# Patient Record
Sex: Male | Born: 1945 | Race: White | Hispanic: No | Marital: Married | Smoking: Former smoker
Health system: Southern US, Community
[De-identification: ages and names within clinical notes are randomized; demographics above are authoritative.]

## PROBLEM LIST (undated history)

## (undated) DIAGNOSIS — J449 Chronic obstructive pulmonary disease, unspecified: Secondary | ICD-10-CM

## (undated) DIAGNOSIS — K219 Gastro-esophageal reflux disease without esophagitis: Secondary | ICD-10-CM

## (undated) DIAGNOSIS — E785 Hyperlipidemia, unspecified: Secondary | ICD-10-CM

## (undated) DIAGNOSIS — IMO0001 Reserved for inherently not codable concepts without codable children: Secondary | ICD-10-CM

## (undated) DIAGNOSIS — I219 Acute myocardial infarction, unspecified: Secondary | ICD-10-CM

## (undated) DIAGNOSIS — C801 Malignant (primary) neoplasm, unspecified: Secondary | ICD-10-CM

## (undated) DIAGNOSIS — I10 Essential (primary) hypertension: Secondary | ICD-10-CM

## (undated) DIAGNOSIS — I6340 Cerebral infarction due to embolism of unspecified cerebral artery: Secondary | ICD-10-CM

## (undated) DIAGNOSIS — R062 Wheezing: Secondary | ICD-10-CM

## (undated) DIAGNOSIS — I509 Heart failure, unspecified: Secondary | ICD-10-CM

## (undated) DIAGNOSIS — R053 Chronic cough: Secondary | ICD-10-CM

## (undated) DIAGNOSIS — I739 Peripheral vascular disease, unspecified: Secondary | ICD-10-CM

## (undated) DIAGNOSIS — R7303 Prediabetes: Secondary | ICD-10-CM

## (undated) DIAGNOSIS — R05 Cough: Secondary | ICD-10-CM

## (undated) DIAGNOSIS — M1990 Unspecified osteoarthritis, unspecified site: Secondary | ICD-10-CM

## (undated) DIAGNOSIS — I2510 Atherosclerotic heart disease of native coronary artery without angina pectoris: Secondary | ICD-10-CM

## (undated) HISTORY — DX: Reserved for inherently not codable concepts without codable children: IMO0001

## (undated) HISTORY — DX: Cough: R05

## (undated) HISTORY — DX: Gastro-esophageal reflux disease without esophagitis: K21.9

## (undated) HISTORY — PX: CERVICAL DISC SURGERY: SHX588

## (undated) HISTORY — DX: Essential (primary) hypertension: I10

## (undated) HISTORY — DX: Peripheral vascular disease, unspecified: I73.9

## (undated) HISTORY — DX: Chronic cough: R05.3

## (undated) HISTORY — DX: Chronic obstructive pulmonary disease, unspecified: J44.9

## (undated) HISTORY — DX: Wheezing: R06.2

## (undated) HISTORY — DX: Hyperlipidemia, unspecified: E78.5

## (undated) HISTORY — DX: Atherosclerotic heart disease of native coronary artery without angina pectoris: I25.10

## (undated) HISTORY — PX: APPENDECTOMY: SHX54

---

## 2001-11-15 HISTORY — PX: CORONARY ANGIOPLASTY WITH STENT PLACEMENT: SHX49

## 2002-02-23 ENCOUNTER — Inpatient Hospital Stay (HOSPITAL_COMMUNITY): Admission: EM | Admit: 2002-02-23 | Discharge: 2002-02-27 | Payer: Self-pay | Admitting: Emergency Medicine

## 2002-02-23 ENCOUNTER — Encounter: Payer: Self-pay | Admitting: Emergency Medicine

## 2006-11-28 ENCOUNTER — Ambulatory Visit (HOSPITAL_COMMUNITY): Admission: RE | Admit: 2006-11-28 | Discharge: 2006-11-28 | Payer: Self-pay | Admitting: Vascular Surgery

## 2006-12-28 ENCOUNTER — Ambulatory Visit: Payer: Self-pay | Admitting: Vascular Surgery

## 2007-02-10 ENCOUNTER — Ambulatory Visit: Payer: Self-pay | Admitting: Vascular Surgery

## 2007-07-04 ENCOUNTER — Ambulatory Visit: Payer: Self-pay | Admitting: Vascular Surgery

## 2008-07-16 ENCOUNTER — Ambulatory Visit: Payer: Self-pay | Admitting: Vascular Surgery

## 2011-03-30 NOTE — Assessment & Plan Note (Signed)
OFFICE VISIT   CARLIN, ATTRIDGE  DOB:  09/27/1946                                       07/16/2008  ZOXWR#:[id number]   I saw the patient in the office today for continued followup of his  peripheral vascular disease.  Since I last saw him in February of 2008  he continues to have left lower extremity claudication.  He says over  the last year his symptoms have remained stable.  He can walk for at  least 50 yards before experiencing symptoms unless he is on an incline.  His claudication symptoms on the left involve his hip, thigh and calf.  He really has no significant claudication on the right side.  He denies  any history of rest pain and has had no history of nonhealing wounds.   SOCIAL HISTORY:  He continues to smoke a pack per day of cigarettes.   REVIEW OF SYSTEMS:  He has had no recent chest pain, chest pressure,  palpitations or arrhythmias.  He has had no bronchitis, asthma or  wheezing.   PHYSICAL EXAMINATION:  General:  On physical examination this is a  pleasant 65 year old gentleman who appears his stated age.  Vital signs:  Blood pressure is 132/79, heart rate is 74.  I do not detect any carotid  bruits.  Lungs:  Are clear bilaterally to auscultation.  Cardiac:  He  has a regular rate and rhythm.  He has a palpable right femoral pulse.  I cannot palpate a left femoral pulse.  He has monophasic Doppler  signals in both feet with an ankle brachial index of 63% on the right  and 55% on the left.   His ABIs remain stable over the last year.  I have again encouraged him  that he quit smoking.  I have also encouraged him to walk as much as  possible.  I plan on seeing him back in 18 months.  He knows to call  sooner if he has problems.   Di Kindle. Edilia Bo, M.D.  Electronically Signed   CSD/MEDQ  D:  07/16/2008  T:  07/17/2008  Job:  [id number]

## 2011-04-02 NOTE — Cardiovascular Report (Signed)
[place]. [facility]  Patient:    Joshua Horton, Joshua Horton Visit Number: [id number] MRN: [id number]          Service Type: MED Location: [phone] Attending Physician:  Talitha Givens Dictated by:   Arturo Morton Riley Kill, M.D. [facility] Proc. Date: 02/26/02 Admit Date:  02/23/2002                          Cardiac Catheterization  ADDENDUM:  The patient has a history of CONTRAST allergy and was pretreated prior to catheterization. Dictated by:   Arturo Morton Riley Kill, M.D. [facility] Attending Physician:  Talitha Givens DD:  02/26/02 TD:  02/26/02 Job: [id number]

## 2011-04-02 NOTE — H&P (Signed)
[place]. [facility]  Patient:    Joshua Horton, Joshua Horton Visit Number: [id number] MRN: [id number]          Service Type: MED Location: [phone] Attending Physician:  Talitha Givens Dictated by:   Luis Abed, M.D. [facility] Admit Date:  02/23/2002 Discharge Date: 02/27/2002                           History and Physical  CHIEF COMPLAINT: Mr. Brister is a 65 year old gentleman.  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  He has had chest discomfort and he came directly to [facility] for further evaluation.  HISTORY OF PRESENT ILLNESS: He does not have a main primary physician at home. He mentions that he has had chest pain with exertion for about one month. Today it was more prolonged and he had some nausea and diaphoresis with it. He had his wife bring him to [employer]. [facility].  He is now here for further evaluation.  ALLERGIES: There is an allergy to CT CONTRAST.  MEDICATIONS: None.  PAST MEDICAL HISTORY:  1. There is question of elevated lipids in the past with claudication,     followed by Dr. Cari Caraway.  2. Question of a murmur in the past.  3. He also had a neck procedure in 1998.  SOCIAL HISTORY: The patient lives in [place], [place] and is a retired Human resources officer, and works on his farm.  He does smoke.  He does not take any drugs.  FAMILY HISTORY: The mother died of cancer, with no coronary disease, and his father had CABG in his 81s.  His siblings have no significant coronary disease.  REVIEW OF SYSTEMS: The patient is not having any significant fevers and chills.  He is having no significant HEENT problems.  There are no significant skin problems.  As mentioned, he has had some chest pain.  He also has intermittent cough with some wheezing and claudication.  He has no GU symptoms.  There are no neurologic symptoms.  He has some neck pain from time to time.  He has had  some nausea today.  The remainder of his Review Of Systems is negative.  PHYSICAL EXAMINATION:  VITAL SIGNS: On physical examination today blood pressure is 160/92. Temperature is 97 degrees.  Pulse is 66.  GENERAL: He appears quite stable.  HEENT: No findings.  NECK: No significant abnormalities.  No obvious lymph nodes are felt.  CARDIAC: S1 and S2.  There are no clicks of significant murmurs today.  LUNGS: Clear.  SKIN: No rashes.  RECTAL: Examination deferred.  ABDOMEN: Soft, without abnormalities.  EXTREMITIES: Stable.  NEUROLOGIC: No obvious deficits.  LABORATORY DATA: Chest x-ray is abnormal, being read by radiology as a 6 mm right upper lobe nodule.  Chest CT scan has been ordered.  EKG reveals nonspecific ST changes.  So far his blood work has shown a hemoglobin of 16.4.  His BUN is 6 and creatinine 0.8.  CPK is 74 but the troponin is listed as 0.16.  PROBLEMS:  1. History of allergy to CONTRAST DYE at the time of a CT scan.  The patient     has on catheterization orders drug allergy prophylaxis medications     ordered.  2. History of claudication, followed by Dr. Cari Caraway.  3. History of a murmur, although I do not hear a murmur in the  noisy     emergency room today.  4. Cigarette use.  5. Positive family history of coronary disease.  6. Current chest pain.  He has had it for one month and it is worse today.     This will be treated as unstable angina and medications are started.  Plan     will be for catheterization on Monday.  His troponin of 0.16 is     borderline.  7. Abnormal chest x-ray, with a 6 mm right upper lobe nodule.  Chest CT     has been ordered. Dictated by:   Luis Abed, M.D. [facility] Attending Physician:  Talitha Givens DD:  02/23/02 TD:  02/24/02 Job: [id number]

## 2011-04-02 NOTE — Op Note (Signed)
Joshua Horton, Joshua Horton              ACCOUNT NO.:  [id number]   MEDICAL RECORD NO.:  [id number]          PATIENT TYPE:  AMB   LOCATION:  SDS                          FACILITY:  [facility]   PHYSICIAN:  Di Kindle. Edilia Bo, M.D.DATE OF BIRTH:  February 25, 1946   DATE OF PROCEDURE:  11/28/2006  DATE OF DISCHARGE:                               OPERATIVE REPORT   PREOPERATIVE DIAGNOSIS:  Left leg claudication, possible atheroembolic  disease.   POSTOPERATIVE DIAGNOSIS:  Left iliac artery occlusion.   PROCEDURES:  1. Aortogram.  2. Bilateral iliac arteriogram.  3. Bilateral lower extremity runoff.   SURGEON:  Di Kindle. Edilia Bo, M.D.   ANESTHESIA:  Local with sedation.   TECHNIQUE:  The patient was taken to the PV lab at [facility] and sedated with  a milligram of Versed and 50 mcg of fentanyl.  Both groins were prepped  and draped in usual sterile fashion.  After the skin was anesthetized  with consent lidocaine.  The right common femoral artery was cannulated  and a guidewire introduced into the infrarenal aorta under fluoroscopic  control.  A 5-French sheath was passed over the wire.  The dilator was  removed.  A pigtail catheter was positioned at the L1 vertebral body and  flush aortogram obtained.  The catheter was repositioned above the  aortic bifurcation and with a blood pressure cuff on the right thigh  inflated to supra systolic pressures, left lower extremity runoff film  was obtained.  The cuff was then deflated.  Oblique iliac projections  were then obtained.  The pigtail catheter was then removed over wire and  additional right lower extremity runoff film obtained through the right  femoral sheath.   FINDINGS:  There are two renal arteries on the right and one renal  artery on the left with no renal artery stenosis identified.  There is  no significant occlusive disease of the infrarenal aorta was widely  patent.  On the right side there is some ulcerated plaque in the  proximal right common iliac artery.  This produces only an approximately  20% stenosis.  The distal right common iliac artery, the hypogastric  artery, and the external iliac artery are widely patent on the right.  The left common iliac artery is occluded at its origin.  There is some  collateral filling of the hypogastric artery but the external iliac  artery is also occluded.   On the right side the common femoral, deep femoral and superficial  femoral arteries are all widely patent.  There is some mild disease of  the proximal popliteal artery.  The popliteal artery is otherwise patent  with three-vessel runoff on the right.   On the left side there is reconstitution of the common femoral artery on  the left.  The deep femoral artery appears to have some plaque  proximally.  The superficial femoral artery is patent with some mild  disease of the adductor canal.  The popliteal artery is patent.  There is moderate disease of the  tibioperoneal trunk.  There is two-vessel runoff via the anterior tibial  and posterior tibial arteries.  The peroneal was not visualized.   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      Di Kindle. Edilia Bo, M.D.  Electronically Signed     CSD/MEDQ  D:  11/28/2006  T:  11/28/2006  Job:  [id number]

## 2011-04-02 NOTE — Cardiovascular Report (Signed)
[place]. [facility]  Patient:    Joshua Horton, Joshua Horton Visit Number: [id number] MRN: [id number]          Service Type: MED Location: [phone] Attending Physician:  Talitha Givens Dictated by:   Arturo Morton Riley Kill, M.D. [facility] Proc. Date: 02/26/02 Admit Date:  02/23/2002   CC:         Luis Abed, M.D. [facility]  CV Laboratory   Cardiac Catheterization  INDICATIONS: The patient is a 64 year old male, who presents with non-ST elevation, myocardial infarction. He had an abnormal chest x-ray, and this was followed by a CT which suggested a calcified granuloma. The current study was done to assess coronary anatomy after the patient had positive enzymes and chest pain. According to his wife, he has been having chest pain for about a month, mostly with exertion and has been treating it as indigestion.  PROCEDURES: 1. Left heart catheterization. 2. Selective coronary arteriography. 3. Selective left ventriculography. 4. Percutaneous transluminal coronary angioplasty and stenting of the    circumflex obtuse marginal branch with percutaneous transluminal coronary    angioplasty of the arteriovenous circumflex through the previously placed    stent.  DESCRIPTION OF PROCEDURE: The patient was brought to the catheterization lab and prepped and draped in the usual fashion. Through an anterior puncture, the right femoral artery was easily entered. Views of the left and right coronary arteries were obtained in multiple angiographic projections. Ventriculography was performed in the RAO projection. The patient had evidence of a high-grade mid circumflex stenosis. He clearly needed percutaneous coronary intervention. We discussed the options with the patient and he was desirous of proceeding today. I then reviewed the films and took the images out to his wife and explained the procedure to her. Preparations were then made for percutaneous intervention. Heparin was  given according to protocol and an ACT was checked. Integrilin was also given according to protocol. A Foley catheter was placed. A JL 3.5 guiding catheter, 7 [place] was used through a 7 [place] sheath, which was exchanged for the 6 [place] sheath.  The lesion was crossed with a BMW wire. The lesion was then subsequently predilated with a 2.75 x 20 mm across the balloon. Following this, a 3.0 x 23 mm stent was placed across the ostium of the AV circumflex and across the lesion. We had explained previously to the patient that there might be a necessity to rescue this lesion. The area was then stented with an excellent angiographic appearance.  There was a slightly dumbbell at the lesion site, which was distal to the AV circumflex, and this was then postdilated with a 3.5 x 10 Quantum Maverick.  Following this, the wire was passed down the AV circumflex in the ostium, which had been about 50-70% prior to the intervention, now appeared to be 90%, was gently dilated using a 2.5 mm x 10 balloon. There was marked improvement in the appearance of the artery. The procedure was then completed. I reviewed the pictures with the patients spouse. Femoral sheath was sewn into place and he was taken to the holding area in satisfactory clinical condition. ACTs were checked throughout the course of the procedure.  HEMODYNAMIC DATA: 1. Central aorta 154/84. 2. Left ventricle 152/10. 3. No aortic to left ventricular gradient.  ANGIOGRAPHIC DATA: 1. Ventriculography in the RAO projection reveals preserved global systolic    function. No segmental abnormalities contraction were identified. 2. The left main coronary was free of critical disease. 3. The left  anterior descending artery courses to the apex. There is about    50% segmental narrowing just after the origin of the diagonal.  The    diagonal itself was moderate in size and has about a 40% of ostial    narrowing and some mild diffuse luminal  irregularities. There is about    40% segmental irregularity in the midportion of the LAD as well. 4. The circumflex has about a 70% area of narrowing prior to the bifurcation    of the AV circumflex in the large marginal. The large marginal then has    about 50% segmental narrowing, then a 90% focal area of narrowing.  The AV    circumflex has about 75% narrowing. Following the stenting procedure, the    proximal circumflex leading into the OM was reduced from 70, 50, and 90    down to 0% residual luminal narrowing with excellent an angiographic    appearance. The AV circumflex was covered by the stent and went from 75 to    90%, then down to about 50-70% after balloon dilatation in this site. There    was TIMI-3 runoff in both branches. 5. The right coronary artery demonstrates about 50% proximal narrowing and    then diffuse 30% narrowing.  There is about 50% narrowing prior to the    PDA.  This does not dramatically change with intracoronary nitroglycerin.  CONCLUSIONS: 1. Successful percutaneous stenting of the circumflex including the    circumflex marginal across the arteriovenous circumflex, and with    successful balloon dilatation of the arteriovenous circumflex through    the previously placed stent. 2. Non-ST elevation myocardial infarction. 3. Continued tobacco use.  PLAN: The patient will benefit by cardiac rehabilitation. The patient will need aspirin and Plavix for a period determined by Dr. Myrtis Ser. He needs to discontinue smoking if at all possible. Lipid management would be important, and risk factor reduction. Dictated by:   Arturo Morton Riley Kill, M.D. [facility] Attending Physician:  Talitha Givens DD:  02/26/02 TD:  02/26/02 Job: [id number]

## 2011-04-02 NOTE — Discharge Summary (Signed)
[place]. [facility]  Patient:    DAI, MCADAMS Visit Number: [id number] MRN: [id number]          Service Type: MED Location: [phone] Attending Physician:  Talitha Givens Dictated by:   Brita Romp, P.A. Admit Date:  02/23/2002 Discharge Date: 02/27/2002                             Discharge Summary  DISCHARGE DIAGNOSES: 1. Non ST segment myocardial infarction. 2. Coronary artery disease. 3. Hyperlipidemia. 4. History of tobacco abuse. 5. No allergy to IV contrast dye.  HOSPITAL COURSE: Mr. Gleaves is a 65 year old male with no prior history of coronary artery disease. The patient reported approximately a one month history of chest pain which radiated from the right to the left. He described a stinging in nature. He rated this an 8 out of 10. It was accompanied by shortness of breath and nausea as well as diaphoresis. He stated that initially the pain came on with exertion. However, it has begun to occur at rest. 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 The pain started and he stopped to rest which relieved the pain. After returning home, he and his wife presented to [facility] emergency room. The patient was seen and admitted by Dr. Willa Rough. Dr. Myrtis Ser felt that the patients chest pain was consistent with unstable angina and started the patient on aspirin, Nitroglycerin, Lovenox, as well as beta blockers. He also noted that the patient had an allergy to IV contrast dye and ordered appropriate treatment for   DICTATION CUT OFF... Dictated by:   Brita Romp, P.A. Attending Physician:  Talitha Givens DD:  02/27/02 TD:  02/27/02 Job: [id number]

## 2011-04-02 NOTE — Discharge Summary (Signed)
[place]. [facility]  Patient:    Joshua Horton, Joshua Horton Visit Number: [id number] MRN: [id number]          Service Type: MED Location: [phone] Attending Physician:  Talitha Givens Dictated by:   Brita Romp, P.A.C. Admit Date:  02/23/2002 Disc. Date: 02/27/02   CC:         [facility] [place] S. Edilia Bo, M.D.   Discharge Summary  DISCHARGE DIAGNOSES: 1. Non-ST segment myocardial infarction. 2. Coronary artery disease. 3. Hyperlipidemia. 4. History of tobacco abuse. 5. Known allergy to intravenous contrast dye.  HISTORY OF PRESENT ILLNESS:  [name] is a 65 year old male with known prior history of coronary artery disease.  The patient reported approximately one-month history of chest pain which radiated from the right to the left.  He described a stinging in nature.  He rated it as an 8 out of 10 and was accompanied by shortness of breath, nausea, as well as diaphoresis.  He stated that initially the pain came on with exertion, however, has begun to occur at rest.  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  The pain started and he stopped to rest which relieved the pain.  After returning home, he and his wife presented to the emergency room at [facility].  The patient was seen and admitted by Luis Abed, M.D. [facility].  Dr. Myrtis Ser felt that the patients chest pain was consistent with unstable angina and started the patient on aspirin, nitroglycerin, and Lovenox as well as beta blockers.  He also noted that the patient had an allergy to IV contrast dye and ordered appropriate treatment precatheterization. He also noted that the patient had a small 6 mm nodule in the left upper lobe and ordered a noncontrast CT of the chest to evaluate.  HOSPITAL COURSE:  The following day, the patient was seen by Dorthula Matas, M.D. The patient had no  further chest pain and no shortness of breath.  He noted that the patients troponin had peaked at 0.61 which indicated that the patient had had a non-ST wave myocardial infarction.  He noted the plans for catheterization on Monday.  On March 14, the patient was taken to the catheterization lab by Arturo Morton. Riley Kill, M.D. [facility].  CATHETERIZATION RESULTS: 1. Left ventriculogram; Normal left ventricular ejection fraction and no    wall motion abnormalities. 2. Left main coronary artery; free of critical disease. 3. Left anterior descending; 50% segmental narrowing after the origin of the    diagonal, diagonal was noted to have 40% ostial narrowing and some mild    diffuse luminal irregularities, there is also a 40% segmental narrowing in    the mid LAD. 4. The circumflex had a 70% narrowing prior to the bifurcation of the AV    circumflex and marginal, the marginal then had a 50% segmental narrowing    followed by a 90% focal narrowing.  The AV circumflex itself had a 75%    narrowing. 5. Right coronary artery had 50% proximal narrowing as well as diffuse 30%    narrowing as well as 50% narrowing prior to the takeoff of the PDA.  Dr. Riley Kill then performed angioplasty and stenting to the 70% lesion in the circumflex.  He then performed an angioplasty of the AV circumflex passing through the previously placed stent.  TIMI 3 flow resulted in all vessels. The next  day, the patient was seen by Dr. Myrtis Ser.  The patient had no further chest pain or shortness of breath.  He felt he was stable for discharge.  Apparently several years ago, the patient had been given an unknown cholesterol medicine by his primary care physician who at the time was Kari Baars, M.D.  A message was left with Dr. Juanetta Gosling office to try to obtain the name of that medication to be passed on to the [facility].  DISCHARGE MEDICATIONS: 1. Enteric-coated aspirin 325 mg q.d. 2. Plavix 75 mg q.d. until gone. 3. Toprol XL  25 mg q.d. 4. Sublingual nitroglycerin spray as needed.  LABORATORY DATA:  White count 9.6, hemoglobin 14.9, hematocrit 42.8, platelets 235.  Sodium 141, potassium 3.7, chloride 106, CO2 27, BUN 10, creatinine 0.9, glucose 95.  Total cholesterol 214, triglycerides 289, HDL 33, LDL 123, total cholesterol HDL ratio 6.5.  TSH 1.974.  CT of the chest revealed calcified granuloma in the left upper lobe.  There were noted to be some pleural plaques bilaterally.  Electrocardiogram showed sinus bradycardia at 58, PR interval 146, QRS 84, QTC 394, axis 57.  DISCHARGE INSTRUCTIONS:  The patient is to avoid driving, heavy lifting, or tub baths for 48 hours.  He is to follow a low fat, low cholesterol diet.  He is to watch the catheterization site for any pain, bleeding, or swelling and to call the [facility] for any of these problems.  He is instructed to stop smoking.  He is to follow up with a PA visit at the [facility] in [place] on April 23, at 3:15 p.m.  He is to follow up with Dr. Myrtis Ser at the [facility] on May 1, at 11:30 a.m. Dictated by:   Brita Romp, P.A.C. Attending Physician:  Talitha Givens DD:  02/27/02 TD:  02/27/02 Job: [id number]

## 2012-08-22 ENCOUNTER — Encounter: Payer: Self-pay | Admitting: Vascular Surgery

## 2012-08-23 ENCOUNTER — Ambulatory Visit (INDEPENDENT_AMBULATORY_CARE_PROVIDER_SITE_OTHER): Payer: Medicare Other | Admitting: Vascular Surgery

## 2012-08-23 ENCOUNTER — Encounter: Payer: Self-pay | Admitting: Vascular Surgery

## 2012-08-23 VITALS — BP 143/72 | HR 83 | Resp 18 | Ht 70.0 in | Wt 204.0 lb

## 2012-08-23 DIAGNOSIS — I70219 Atherosclerosis of native arteries of extremities with intermittent claudication, unspecified extremity: Secondary | ICD-10-CM

## 2012-08-23 NOTE — Addendum Note (Signed)
Addended by: Sharee Pimple on: 08/23/2012 10:35 AM   Modules accepted: Orders

## 2012-08-23 NOTE — Progress Notes (Signed)
[facility] [place]  Patient name: Joshua Horton MRN: [id number] DOB: Jul 04, 1946 Sex: male  REASON FOR CONSULT: bilateral disabling claudication. Referred by Dr.Terry Reuel Boom  HPI: Joshua Horton is a 66 y.o. male who I last saw him back in September of 2009. 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 At that time he did not wish to pursue revascularization. He was then lost to follow up. He presents now with progressive claudication symptoms in both lower extremities. These symptoms he has had he states for many years. However, over the last year his symptoms have gradually progressed and now involve the right lower extremity. He can walk approximately 5200 yards before experiencing pain. He has pain in his hip and thigh and calves bilaterally. Symptoms are about the same on both sides perhaps slightly worse on the left. He underwent an arterial Doppler study on 07/28/2012 which showed an ABI 47% on the right and 42% on the left. He had monophasic waveforms throughout both lower extremities.  I've also reviewed his records from Dr Rosann Auerbach office. He has a history of hypertension and hyperlipidemia with which appeared to be under reasonable control.  Past Medical History  Diagnosis Date  . Coronary artery disease   . Wheezing   . Chronic cough   . Peripheral vascular disease   . Hypertension   . Hyperlipidemia     History reviewed. No pertinent family history.  SOCIAL HISTORY: History  Substance Use Topics  . Smoking status: Current Every Day Smoker -- 1.0 packs/day    Types: Cigarettes  . Smokeless tobacco: Never Used  . Alcohol Use: No    Allergies  Allergen Reactions  . Ivp Dye (Iodinated Diagnostic Agents)     Current Outpatient Prescriptions  Medication Sig Dispense Refill  . aspirin 81 MG tablet Take 81 mg by mouth daily.      Marland Kitchen  lisinopril-hydrochlorothiazide (PRINZIDE,ZESTORETIC) 10-12.5 MG per tablet Take 1 tablet by mouth daily.      Marland Kitchen omeprazole (PRILOSEC) 20 MG capsule Take 20 mg by mouth daily.      . simvastatin (ZOCOR) 40 MG tablet Take 40 mg by mouth every evening.        REVIEW OF SYSTEMS: Arly.Keller ] denotes positive finding; [  ] denotes negative finding  CARDIOVASCULAR:  [ ]  chest pain   [ ]  chest pressure   [ ]  palpitations   [ ]  orthopnea   Arly.Keller ] dyspnea on exertion   Arly.Keller ] claudication   [ ]  rest pain   [ ]  DVT   [ ]  phlebitis PULMONARY:   Arly.Keller ] productive cough   [ ]  asthma   Arly.Keller ] wheezing NEUROLOGIC:   [ ]  weakness  [ ]  paresthesias  [ ]  aphasia  [ ]  amaurosis  [ ]  dizziness HEMATOLOGIC:   [ ]  bleeding problems   [ ]  clotting disorders MUSCULOSKELETAL:  [ ]  joint pain   [ ]  joint swelling [ ]  leg swelling GASTROINTESTINAL: [ ]   blood in stool  [ ]   hematemesis GENITOURINARY:  [ ]   dysuria  [ ]   hematuria PSYCHIATRIC:  [ ]  history of major depression INTEGUMENTARY:  [ ]  rashes  [ ]  ulcers CONSTITUTIONAL:  [ ]  fever   [ ]  chills  PHYSICAL EXAM: Filed Vitals:   08/23/12 0907  BP: 143/72  Pulse: 83  Resp: 18  Height: 5\' 10"  (  1.778 m)  Weight: 204 lb (92.534 kg)   Body mass index is 29.27 kg/(m^2). GENERAL: The patient is a well-nourished male, in no acute distress. The vital signs are documented above. CARDIOVASCULAR: There is a regular rate and rhythm. I do not detect carotid bruits. He has a diminished right femoral pulse. I cannot palpate a left femoral pulse. I cannot palpate pedal pulses. PULMONARY: There is good air exchange bilaterally without wheezing or rales. ABDOMEN: Soft and non-tender with normal pitched bowel sounds. Abdomen is obese and difficult to assess for an aneurysm. MUSCULOSKELETAL: There are no major deformities or cyanosis. NEUROLOGIC: No focal weakness or paresthesias are detected. SKIN: There are no ulcers or rashes noted. PSYCHIATRIC: The patient has a normal affect.  DATA:   I reviewed his previous arteriogram from January of 2008. He had a left common iliac artery occlusion with mild iliac disease on the right. In the right leg he had some mild disease of the proximal popliteal artery with three-vessel runoff. On the left leg he has some mild disease in the distal superficial femoral artery. He had moderate disease the tibial peroneal trunk and three-vessel runoff on the left the anchor tibial and posterior tibial arteries.  ABIs are as described above.  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 He has progressive claudication symptoms of both lower extremities. We have discussed the option of conservative treatment including tobacco cessation and a structured walking program. He does seem interested in tobacco cessation and has written down the number for [facility] program. We also discussed the option of proceeding with arteriography to further assess his multilevel arterial occlusive disease. He has a known left common iliac artery occlusion and in January of 2008 had mild right common iliac artery occlusive disease. If he were to consider revascularization, ideally we would be able to address the iliac disease on the right with an endovascular approach which would make him a candidate for a right to left fem-fem bypass graft. Otherwise he would require aortofemoral bypass grafting. Currently he is willing to try a conservative treatment program. I'll plan on seeing him back in 4 months with follow up ABIs. He knows to call sooner if he has problems.   DICKSON,CHRISTOPHER S [facility] [place] [facility]: [phone]

## 2012-08-23 NOTE — Assessment & Plan Note (Signed)
This patient has multilevel arterial occlusive disease based on his exam in his Doppler studies. He has progressive claudication symptoms of both lower extremities. We have discussed the option of conservative treatment including tobacco cessation and a structured walking program. He does seem interested in tobacco cessation and has written down the number for [place] [facility] program. We also discussed the option of proceeding with arteriography to further assess his multilevel arterial occlusive disease. He has a known left common iliac artery occlusion and in January of 2008 had mild right common iliac artery occlusive disease. If he were to consider revascularization, ideally we would be able to address the iliac disease on the right with an endovascular approach which would make him a candidate for a right to left fem-fem bypass graft. Otherwise he would require aortofemoral bypass grafting. Currently he is willing to try a conservative treatment program. I'll plan on seeing him back in 4 months with follow up ABIs. He knows to call sooner if he has problems.

## 2012-10-19 ENCOUNTER — Ambulatory Visit (HOSPITAL_COMMUNITY)
Admission: RE | Admit: 2012-10-19 | Discharge: 2012-10-19 | Disposition: A | Discharge status: Home / Self Care | Payer: Medicare Other | Source: Ambulatory Visit | Attending: Orthopedic Surgery | Admitting: Orthopedic Surgery

## 2012-10-19 ENCOUNTER — Other Ambulatory Visit: Payer: Self-pay | Admitting: Orthopedic Surgery

## 2012-10-19 ENCOUNTER — Ambulatory Visit (INDEPENDENT_AMBULATORY_CARE_PROVIDER_SITE_OTHER): Payer: Medicare Other | Admitting: Orthopedic Surgery

## 2012-10-19 ENCOUNTER — Encounter: Payer: Self-pay | Admitting: Orthopedic Surgery

## 2012-10-19 DIAGNOSIS — S46019A Strain of muscle(s) and tendon(s) of the rotator cuff of unspecified shoulder, initial encounter: Secondary | ICD-10-CM

## 2012-10-19 DIAGNOSIS — S43429A Sprain of unspecified rotator cuff capsule, initial encounter: Secondary | ICD-10-CM

## 2012-10-19 DIAGNOSIS — M25512 Pain in left shoulder: Secondary | ICD-10-CM

## 2012-10-19 DIAGNOSIS — M25519 Pain in unspecified shoulder: Secondary | ICD-10-CM | POA: Insufficient documentation

## 2012-10-19 NOTE — Progress Notes (Signed)
Patient ID: Joshua Horton, male   DOB: 05-15-1946, 66 y.o.   MRN: [id number] Chief Complaint  Patient presents with  . Shoulder Pain    Left shoulder pain, no injury.    Chief complaint left shoulder pain  The patient was running his lumbar his foot was caught he reached back with his left shoulder and hand to grab something and has had pain for the last 4 weeks. Pain is sharp specimen he raises his arm. It is intermittent. It is worse with reaching behind him and he associates catching sensation worse with forward elevation.  System review history of palpitations shortness of breath wheezing cough snoring heartburn frequency urgency stiffness muscle pain easy bleeding easy bruising temperature intolerance and especially with cold weather  History has been reviewed  Physical Exam(12)  Vital signs:   GENERAL: normal development   CDV: pulses are normal   Skin: normal  Lymph: nodes were not palpable/normal  Psychiatric: awake, alert and oriented  Neuro: normal sensation  MSK  Gait: Ambulates normally 1 Inspection mild tenderness around the deltoid and peri-acromial region 2 Range of Motion painful for elevation but normal passive range of motion active range of motion is limited 250 of flexion 40 of external rotation and internal rotation to the level of L4 3 Motor strength normal in the supraspinatus as well as internal and external rotators 4 Stability normal negative apprehension negative sulcus  Other side:  5 no tenderness no swelling 6 no deformity, joint is reduced without subluxation  Imaging x-rays show no evidence of a fracture  Assessment: Rotator cuff strain left shoulder    Plan: Injection and home exercises followup in 2 months  Subacromial Shoulder Injection Procedure Note  Pre-operative Diagnosis: left RC Syndrome  Post-operative Diagnosis: same  Indications: pain   Anesthesia: ethyl chloride   Procedure Details   Verbal consent was  obtained for the procedure. The shoulder was prepped withalcohol and the skin was anesthetized. A 20 gauge needle was advanced into the subacromial space through posterior approach without difficulty  The space was then injected with 3 ml 1% lidocaine and 1 ml of depomedrol. The injection site was cleansed with isopropyl alcohol and a dressing was applied.  Complications:  None; patient tolerated the procedure well.

## 2012-10-19 NOTE — Patient Instructions (Signed)
You have received a steroid shot. 15% of patients experience increased pain at the injection site with in the next 24 hours. This is best treated with ice and tylenol extra strength 2 tabs every 8 hours. If you are still having pain please call the office.   Impingement Syndrome, Rotator Cuff, Bursitis with Rehab Impingement syndrome is a condition that involves inflammation of the tendons of the rotator cuff and the subacromial bursa, that causes pain in the shoulder. The rotator cuff consists of four tendons and muscles that control much of the shoulder and upper arm function. The subacromial bursa is a fluid filled sac that helps reduce friction between the rotator cuff and one of the bones of the shoulder (acromion). Impingement syndrome is usually an overuse injury that causes swelling of the bursa (bursitis), swelling of the tendon (tendonitis), and/or a tear of the tendon (strain). Strains are classified into three categories. Grade 1 strains cause pain, but the tendon is not lengthened. Grade 2 strains include a lengthened ligament, due to the ligament being stretched or partially ruptured. With grade 2 strains there is still function, although the function may be decreased. Grade 3 strains include a complete tear of the tendon or muscle, and function is usually impaired. SYMPTOMS   Pain around the shoulder, often at the outer portion of the upper arm.  Pain that gets worse with shoulder function, especially when reaching overhead or lifting.  Sometimes, aching when not using the arm.  Pain that wakes you up at night.  Sometimes, tenderness, swelling, warmth, or redness over the affected area.  Loss of strength.  Limited motion of the shoulder, especially reaching behind the back (to the back pocket or to unhook bra) or across your body.  Crackling sound (crepitation) when moving the arm.  Biceps tendon pain and inflammation (in the front of the shoulder). Worse when bending the elbow  or lifting. CAUSES  Impingement syndrome is often an overuse injury, in which chronic (repetitive) motions cause the tendons or bursa to become inflamed. A strain occurs when a force is paced on the tendon or muscle that is greater than it can withstand. Common mechanisms of injury include: Stress from sudden increase in duration, frequency, or intensity of training.  Direct hit (trauma) to the shoulder.  Aging, erosion of the tendon with normal use.  Bony bump on shoulder (acromial spur). RISK INCREASES WITH:  Contact sports (football, wrestling, boxing).  Throwing sports (baseball, tennis, volleyball).  Weightlifting and bodybuilding.  Heavy labor.  Previous injury to the rotator cuff, including impingement.  Poor shoulder strength and flexibility.  Failure to warm up properly before activity.  Inadequate protective equipment.  Old age.  Bony bump on shoulder (acromial spur). PREVENTION   Warm up and stretch properly before activity.  Allow for adequate recovery between workouts.  Maintain physical fitness:  Strength, flexibility, and endurance.  Cardiovascular fitness.  Learn and use proper exercise technique. PROGNOSIS  If treated properly, impingement syndrome usually goes away within 6 weeks. Sometimes surgery is required.  RELATED COMPLICATIONS   Longer healing time if not properly treated, or if not given enough time to heal.  Recurring symptoms, that result in a chronic condition.  Shoulder stiffness, frozen shoulder, or loss of motion.  Rotator cuff tendon tear.  Recurring symptoms, especially if activity is resumed too soon, with overuse, with a direct blow, or when using poor technique. TREATMENT  Treatment first involves the use of ice and medicine, to reduce pain and inflammation.   The use of strengthening and stretching exercises may help reduce pain with activity. These exercises may be performed at home or with a therapist. If non-surgical  treatment is unsuccessful after more than 6 months, surgery may be advised. After surgery and rehabilitation, activity is usually possible in 3 months.  MEDICATION  If pain medicine is needed, nonsteroidal anti-inflammatory medicines (aspirin and ibuprofen), or other minor pain relievers (acetaminophen), are often advised.  Do not take pain medicine for 7 days before surgery.  Prescription pain relievers may be given, if your caregiver thinks they are needed. Use only as directed and only as much as you need.  Corticosteroid injections may be given by your caregiver. These injections should be reserved for the most serious cases, because they may only be given a certain number of times. HEAT AND COLD  Cold treatment (icing) should be applied for 10 to 15 minutes every 2 to 3 hours for inflammation and pain, and immediately after activity that aggravates your symptoms. Use ice packs or an ice massage.  Heat treatment may be used before performing stretching and strengthening activities prescribed by your caregiver, physical therapist, or athletic trainer. Use a heat pack or a warm water soak. SEEK MEDICAL CARE IF:   Symptoms get worse or do not improve in 4 to 6 weeks, despite treatment.  New, unexplained symptoms develop. (Drugs used in treatment may produce side effects.) EXERCISES  RANGE OF MOTION (ROM) AND STRETCHING EXERCISES - Impingement Syndrome (Rotator Cuff  Tendinitis, Bursitis) These exercises may help you when beginning to rehabilitate your injury. Your symptoms may go away with or without further involvement from your physician, physical therapist or athletic trainer. While completing these exercises, remember:   Restoring tissue flexibility helps normal motion to return to the joints. This allows healthier, less painful movement and activity.  An effective stretch should be held for at least 30 seconds.  A stretch should never be painful. You should only feel a gentle  lengthening or release in the stretched tissue. STRETCH  Flexion, Standing  Stand with good posture. With an underhand grip on your right / left hand, and an overhand grip on the opposite hand, grasp a broomstick or cane so that your hands are a little more than shoulder width apart.  Keeping your right / left elbow straight and shoulder muscles relaxed, push the stick with your opposite hand, to raise your right / left arm in front of your body and then overhead. Raise your arm until you feel a stretch in your right / left shoulder, but before you have increased shoulder pain.  Try to avoid shrugging your right / left shoulder as your arm rises, by keeping your shoulder blade tucked down and toward your mid-back spine. Hold for __________ seconds.  Slowly return to the starting position. Repeat __________ times. Complete this exercise __________ times per day. STRETCH  Abduction, Supine  Lie on your back. With an underhand grip on your right / left hand and an overhand grip on the opposite hand, grasp a broomstick or cane so that your hands are a little more than shoulder width apart.  Keeping your right / left elbow straight and your shoulder muscles relaxed, push the stick with your opposite hand, to raise your right / left arm out to the side of your body and then overhead. Raise your arm until you feel a stretch in your right / left shoulder, but before you have increased shoulder pain.  Try to avoid shrugging your   right / left shoulder as your arm rises, by keeping your shoulder blade tucked down and toward your mid-back spine. Hold for __________ seconds.  Slowly return to the starting position. Repeat __________ times. Complete this exercise __________ times per day. ROM  Flexion, Active-Assisted  Lie on your back. You may bend your knees for comfort.  Grasp a broomstick or cane so your hands are about shoulder width apart. Your right / left hand should grip the end of the stick, so  that your hand is positioned "thumbs-up," as if you were about to shake hands.  Using your healthy arm to lead, raise your right / left arm overhead, until you feel a gentle stretch in your shoulder. Hold for __________ seconds.  Use the stick to assist in returning your right / left arm to its starting position. Repeat __________ times. Complete this exercise __________ times per day.  ROM - Internal Rotation, Supine   Lie on your back on a firm surface. Place your right / left elbow about 60 degrees away from your side. Elevate your elbow with a folded towel, so that the elbow and shoulder are the same height.  Using a broomstick or cane and your strong arm, pull your right / left hand toward your body until you feel a gentle stretch, but no increase in your shoulder pain. Keep your shoulder and elbow in place throughout the exercise.  Hold for __________ seconds. Slowly return to the starting position. Repeat __________ times. Complete this exercise __________ times per day. STRETCH - Internal Rotation  Place your right / left hand behind your back, palm up.  Throw a towel or belt over your opposite shoulder. Grasp the towel with your right / left hand.  While keeping an upright posture, gently pull up on the towel, until you feel a stretch in the front of your right / left shoulder.  Avoid shrugging your right / left shoulder as your arm rises, by keeping your shoulder blade tucked down and toward your mid-back spine.  Hold for __________ seconds. Release the stretch, by lowering your healthy hand. Repeat __________ times. Complete this exercise __________ times per day. ROM - Internal Rotation   Using an underhand grip, grasp a stick behind your back with both hands.  While standing upright with good posture, slide the stick up your back until you feel a mild stretch in the front of your shoulder.  Hold for __________ seconds. Slowly return to your starting position. Repeat  __________ times. Complete this exercise __________ times per day.  STRETCH  Posterior Shoulder Capsule   Stand or sit with good posture. Grasp your right / left elbow and draw it across your chest, keeping it at the same height as your shoulder.  Pull your elbow, so your upper arm comes in closer to your chest. Pull until you feel a gentle stretch in the back of your shoulder.  Hold for __________ seconds. Repeat __________ times. Complete this exercise __________ times per day. STRENGTHENING EXERCISES - Impingement Syndrome (Rotator Cuff Tendinitis, Bursitis) These exercises may help you when beginning to rehabilitate your injury. They may resolve your symptoms with or without further involvement from your physician, physical therapist or athletic trainer. While completing these exercises, remember:  Muscles can gain both the endurance and the strength needed for everyday activities through controlled exercises.  Complete these exercises as instructed by your physician, physical therapist or athletic trainer. Increase the resistance and repetitions only as guided.  You may experience muscle   soreness or fatigue, but the pain or discomfort you are trying to eliminate should never worsen during these exercises. If this pain does get worse, stop and make sure you are following the directions exactly. If the pain is still present after adjustments, discontinue the exercise until you can discuss the trouble with your clinician.  During your recovery, avoid activity or exercises which involve actions that place your injured hand or elbow above your head or behind your back or head. These positions stress the tissues which you are trying to heal. STRENGTH - Scapular Depression and Adduction   With good posture, sit on a firm chair. Support your arms in front of you, with pillows, arm rests, or on a table top. Have your elbows in line with the sides of your body.  Gently draw your shoulder blades  down and toward your mid-back spine. Gradually increase the tension, without tensing the muscles along the top of your shoulders and the back of your neck.  Hold for __________ seconds. Slowly release the tension and relax your muscles completely before starting the next repetition.  After you have practiced this exercise, remove the arm support and complete the exercise in standing as well as sitting position. Repeat __________ times. Complete this exercise __________ times per day.  STRENGTH - Shoulder Abductors, Isometric  With good posture, stand or sit about 4-6 inches from a wall, with your right / left side facing the wall.  Bend your right / left elbow. Gently press your right / left elbow into the wall. Increase the pressure gradually, until you are pressing as hard as you can, without shrugging your shoulder or increasing any shoulder discomfort.  Hold for __________ seconds.  Release the tension slowly. Relax your shoulder muscles completely before you begin the next repetition. Repeat __________ times. Complete this exercise __________ times per day.  STRENGTH - External Rotators, Isometric  Keep your right / left elbow at your side and bend it 90 degrees.  Step into a door frame so that the outside of your right / left wrist can press against the door frame without your upper arm leaving your side.  Gently press your right / left wrist into the door frame, as if you were trying to swing the back of your hand away from your stomach. Gradually increase the tension, until you are pressing as hard as you can, without shrugging your shoulder or increasing any shoulder discomfort.  Hold for __________ seconds.  Release the tension slowly. Relax your shoulder muscles completely before you begin the next repetition. Repeat __________ times. Complete this exercise __________ times per day.  STRENGTH - Supraspinatus   Stand or sit with good posture. Grasp a __________ weight, or an  exercise band or tubing, so that your hand is "thumbs-up," like you are shaking hands.  Slowly lift your right / left arm in a "V" away from your thigh, diagonally into the space between your side and straight ahead. Lift your hand to shoulder height or as far as you can, without increasing any shoulder pain. At first, many people do not lift their hands above shoulder height.  Avoid shrugging your right / left shoulder as your arm rises, by keeping your shoulder blade tucked down and toward your mid-back spine.  Hold for __________ seconds. Control the descent of your hand, as you slowly return to your starting position. Repeat __________ times. Complete this exercise __________ times per day.  STRENGTH - External Rotators  Secure a rubber exercise   band or tubing to a fixed object (table, pole) so that it is at the same height as your right / left elbow when you are standing or sitting on a firm surface.  Stand or sit so that the secured exercise band is at your uninjured side.  Bend your right / left elbow 90 degrees. Place a folded towel or small pillow under your right / left arm, so that your elbow is a few inches away from your side.  Keeping the tension on the exercise band, pull it away from your body, as if pivoting on your elbow. Be sure to keep your body steady, so that the movement is coming only from your rotating shoulder.  Hold for __________ seconds. Release the tension in a controlled manner, as you return to the starting position. Repeat __________ times. Complete this exercise __________ times per day.  STRENGTH - Internal Rotators   Secure a rubber exercise band or tubing to a fixed object (table, pole) so that it is at the same height as your right / left elbow when you are standing or sitting on a firm surface.  Stand or sit so that the secured exercise band is at your right / left side.  Bend your elbow 90 degrees. Place a folded towel or small pillow under your right  / left arm so that your elbow is a few inches away from your side.  Keeping the tension on the exercise band, pull it across your body, toward your stomach. Be sure to keep your body steady, so that the movement is coming only from your rotating shoulder.  Hold for __________ seconds. Release the tension in a controlled manner, as you return to the starting position. Repeat __________ times. Complete this exercise __________ times per day.  STRENGTH - Scapular Protractors, Standing   Stand arms length away from a wall. Place your hands on the wall, keeping your elbows straight.  Begin by dropping your shoulder blades down and toward your mid-back spine.  To strengthen your protractors, keep your shoulder blades down, but slide them forward on your rib cage. It will feel as if you are lifting the back of your rib cage away from the wall. This is a subtle motion and can be challenging to complete. Ask your caregiver for further instruction, if you are not sure you are doing the exercise correctly.  Hold for __________ seconds. Slowly return to the starting position, resting the muscles completely before starting the next repetition. Repeat __________ times. Complete this exercise __________ times per day. STRENGTH - Scapular Protractors, Supine  Lie on your back on a firm surface. Extend your right / left arm straight into the air while holding a __________ weight in your hand.  Keeping your head and back in place, lift your shoulder off the floor.  Hold for __________ seconds. Slowly return to the starting position, and allow your muscles to relax completely before starting the next repetition. Repeat __________ times. Complete this exercise __________ times per day. STRENGTH - Scapular Protractors, Quadruped  Get onto your hands and knees, with your shoulders directly over your hands (or as close as you can be, comfortably).  Keeping your elbows locked, lift the back of your rib cage up  into your shoulder blades, so your mid-back rounds out. Keep your neck muscles relaxed.  Hold this position for __________ seconds. Slowly return to the starting position and allow your muscles to relax completely before starting the next repetition. Repeat __________ times. Complete   this exercise __________ times per day.  STRENGTH - Scapular Retractors  Secure a rubber exercise band or tubing to a fixed object (table, pole), so that it is at the height of your shoulders when you are either standing, or sitting on a firm armless chair.  With a palm down grip, grasp an end of the band in each hand. Straighten your elbows and lift your hands straight in front of you, at shoulder height. Step back, away from the secured end of the band, until it becomes tense.  Squeezing your shoulder blades together, draw your elbows back toward your sides, as you bend them. Keep your upper arms lifted away from your body throughout the exercise.  Hold for __________ seconds. Slowly ease the tension on the band, as you reverse the directions and return to the starting position. Repeat __________ times. Complete this exercise __________ times per day. STRENGTH - Shoulder Extensors   Secure a rubber exercise band or tubing to a fixed object (table, pole) so that it is at the height of your shoulders when you are either standing, or sitting on a firm armless chair.  With a thumbs-up grip, grasp an end of the band in each hand. Straighten your elbows and lift your hands straight in front of you, at shoulder height. Step back, away from the secured end of the band, until it becomes tense.  Squeezing your shoulder blades together, pull your hands down to the sides of your thighs. Do not allow your hands to go behind you.  Hold for __________ seconds. Slowly ease the tension on the band, as you reverse the directions and return to the starting position. Repeat __________ times. Complete this exercise __________ times  per day.  STRENGTH - Scapular Retractors and External Rotators   Secure a rubber exercise band or tubing to a fixed object (table, pole) so that it is at the height as your shoulders, when you are either standing, or sitting on a firm armless chair.  With a palm down grip, grasp an end of the band in each hand. Bend your elbows 90 degrees and lift your elbows to shoulder height, at your sides. Step back, away from the secured end of the band, until it becomes tense.  Squeezing your shoulder blades together, rotate your shoulders so that your upper arms and elbows remain stationary, but your fists travel upward to head height.  Hold for __________ seconds. Slowly ease the tension on the band, as you reverse the directions and return to the starting position. Repeat __________ times. Complete this exercise __________ times per day.  STRENGTH - Scapular Retractors and External Rotators, Rowing   Secure a rubber exercise band or tubing to a fixed object (table, pole) so that it is at the height of your shoulders, when you are either standing, or sitting on a firm armless chair.  With a palm down grip, grasp an end of the band in each hand. Straighten your elbows and lift your hands straight in front of you, at shoulder height. Step back, away from the secured end of the band, until it becomes tense.  Step 1: Squeeze your shoulder blades together. Bending your elbows, draw your hands to your chest, as if you are rowing a boat. At the end of this motion, your hands and elbow should be at shoulder height and your elbows should be out to your sides.  Step 2: Rotate your shoulders, to raise your hands above your head. Your forearms should be vertical   and your upper arms should be horizontal.  Hold for __________ seconds. Slowly ease the tension on the band, as you reverse the directions and return to the starting position. Repeat __________ times. Complete this exercise __________ times per day.    STRENGTH  Scapular Depressors  Find a sturdy chair without wheels, such as a dining room chair.  Keeping your feet on the floor, and your hands on the chair arms, lift your bottom up from the seat, and lock your elbows.  Keeping your elbows straight, allow gravity to pull your body weight down. Your shoulders will rise toward your ears.  Raise your body against gravity by drawing your shoulder blades down your back, shortening the distance between your shoulders and ears. Although your feet should always maintain contact with the floor, your feet should progressively support less body weight, as you get stronger.  Hold for __________ seconds. In a controlled and slow manner, lower your body weight to begin the next repetition. Repeat __________ times. Complete this exercise __________ times per day.  Document Released: 11/01/2005 Document Revised: 01/24/2012 Document Reviewed: 02/13/2009 [employer] Patient Information 2013 [employer]   

## 2012-12-14 ENCOUNTER — Ambulatory Visit: Payer: Medicare Other | Admitting: Orthopedic Surgery

## 2012-12-26 ENCOUNTER — Encounter: Payer: Self-pay | Admitting: Vascular Surgery

## 2012-12-27 ENCOUNTER — Ambulatory Visit (INDEPENDENT_AMBULATORY_CARE_PROVIDER_SITE_OTHER): Payer: Medicare Other | Admitting: Vascular Surgery

## 2012-12-27 ENCOUNTER — Encounter: Payer: Self-pay | Admitting: Vascular Surgery

## 2012-12-27 ENCOUNTER — Encounter (INDEPENDENT_AMBULATORY_CARE_PROVIDER_SITE_OTHER): Payer: Medicare Other | Admitting: *Deleted

## 2012-12-27 ENCOUNTER — Other Ambulatory Visit: Payer: Self-pay | Admitting: *Deleted

## 2012-12-27 VITALS — BP 128/77 | HR 83 | Ht 70.0 in | Wt 207.8 lb

## 2012-12-27 DIAGNOSIS — I739 Peripheral vascular disease, unspecified: Secondary | ICD-10-CM

## 2012-12-27 DIAGNOSIS — I70219 Atherosclerosis of native arteries of extremities with intermittent claudication, unspecified extremity: Secondary | ICD-10-CM

## 2012-12-27 NOTE — Assessment & Plan Note (Signed)
This patient has stable bilateral lower extremity claudication. We have again discussed the importance of tobacco cessation. I've also discussed with him the importance of a structured walking program.I have discussed with the patient the nature of atherosclerosis, and emphasized the importance of maximal medical management including control of blood pressure, cholesterol levels, antiplatelet agents, obtaining regular exercise, and cessation of smoking. The patient is aware that without maximal medical management the underlying atherosclerotic disease process will progress and also limit the benefit of any interventions. That his symptoms are stable plan on seeing him back in 9 months. He understands we wouldn't consider arteriography and revascularization if he developed disabling claudication, rest pain, or nonhealing ulcer. He knows to call sooner if he has problems.

## 2012-12-27 NOTE — Progress Notes (Signed)
[facility] [place]  Patient name: Joshua Horton MRN: [id number] DOB: Feb 13, 1946 Sex: male  REASON FOR VISIT: follow up of bilateral lower extremity claudication.  HPI: Joshua Horton is a 67 y.o. male with a known left common iliac artery occlusion. I been following him with bilateral lower extremity claudication. He was last seen on 08/23/2012. At that time his symptoms were stable and we recommended tobacco cessation and a structured walking program. Since I saw him last, he continues to have bilateral lower extremity claudication. His symptoms are the same on both sides. He experiences pain in his hips, thighs, and calves which is brought on by ambulation and relieved with rest. His symptoms have been stable over the last 4-6 months. He denies any history of rest pain or nonhealing ulcers.   He does continue to smoke. He smokes approximately one pack per day. He has not been walking on a daily basis. He attributes this to the weather.  Past Medical History  Diagnosis Date  . Coronary artery disease   . Wheezing   . Chronic cough   . Peripheral vascular disease   . Hypertension   . Hyperlipidemia   . Reflux     Family History  Problem Relation Age of Onset  . Heart disease    . Arthritis    . Cancer    . Kidney disease      SOCIAL HISTORY: History  Substance Use Topics  . Smoking status: Current Every Day Smoker -- 1.00 packs/day    Types: Cigarettes  . Smokeless tobacco: Never Used  . Alcohol Use: No    Allergies  Allergen Reactions  . Ivp Dye (Iodinated Diagnostic Agents)     Current Outpatient Prescriptions  Medication Sig Dispense Refill  . aspirin 81 MG tablet Take 81 mg by mouth daily.      Marland Kitchen lisinopril-hydrochlorothiazide (PRINZIDE,ZESTORETIC) 10-12.5 MG per tablet Take 1 tablet by mouth daily.      Marland Kitchen omeprazole (PRILOSEC) 20 MG capsule Take 20 mg by mouth daily.      . simvastatin (ZOCOR) 40 MG tablet Take 40 mg by mouth every  evening.       No current facility-administered medications for this visit.    REVIEW OF SYSTEMS: Arly.Keller ] denotes positive finding; [  ] denotes negative finding  CARDIOVASCULAR:  [ ]  chest pain   [ ]  chest pressure   [ ]  palpitations   [ ]  orthopnea   [X]  dyspnea on exertion   Arly.Keller ] claudication   [ ]  rest pain   [ ]  DVT   [ ]  phlebitis PULMONARY:   Arly.Keller ] productive cough   [ ]  asthma   Arly.Keller ] wheezing NEUROLOGIC:   [ ]  weakness  [ ]  paresthesias  [ ]  aphasia  [ ]  amaurosis  [ ]  dizziness HEMATOLOGIC:   [ ]  bleeding problems   [ ]  clotting disorders MUSCULOSKELETAL:  [ ]  joint pain   [ ]  joint swelling [ ]  leg swelling GASTROINTESTINAL: [ ]   blood in stool  [ ]   hematemesis GENITOURINARY:  [ ]   dysuria  [ ]   hematuria PSYCHIATRIC:  [ ]  history of major depression INTEGUMENTARY:  [ ]  rashes  [ ]  ulcers CONSTITUTIONAL:  [ ]  fever   [ ]  chills  PHYSICAL EXAM: Filed Vitals:   12/27/12 1002  BP: 128/77  Pulse: 83  Height: 5\' 10"  (1.778 m)  Weight: 207 lb 12.8 oz (94.257 kg)  SpO2: 96%  Body mass index is 29.82 kg/(m^2). GENERAL: The patient is a well-nourished male, in no acute distress. The vital signs are documented above. CARDIOVASCULAR: There is a regular rate and rhythm. I do not detect carotid bruits. I cannot palpate a left femoral pulse. He does have a palpable but diminished right femoral pulse. I cannot palpate popliteal or pedal pulses. He has no significant lower extremity swelling. PULMONARY: There is good air exchange bilaterally without wheezing or rales. ABDOMEN: Soft and non-tender with normal pitched bowel sounds.  MUSCULOSKELETAL: There are no major deformities or cyanosis. NEUROLOGIC: No focal weakness or paresthesias are detected. SKIN: there are no ischemic ulcers. PSYCHIATRIC: The patient has a normal affect.  DATA:  I have independently interpreted his arterial Doppler study. This shows an ABI 59% on the right with a monophasic Doppler signals in the dorsalis pedis  and posterior tibial positions. This has actually improved compared to his most recent study when his ABI was 47% on the right. On the left side ABI today is 51% with monophasic Doppler signals in the dorsalis pedis and posterior tibial positions. ABI previously on the left was 42%.  MEDICAL ISSUES:  Atherosclerosis of native arteries of the extremities with intermittent claudication This patient has stable bilateral lower extremity claudication. We have again discussed the importance of tobacco cessation. I've also discussed with him the importance of a structured walking program.I have discussed with the patient the nature of atherosclerosis, and emphasized the importance of maximal medical management including control of blood pressure, cholesterol levels, antiplatelet agents, obtaining regular exercise, and cessation of smoking. The patient is aware that without maximal medical management the underlying atherosclerotic disease process will progress and also limit the benefit of any interventions. That his symptoms are stable plan on seeing him back in 9 months. He understands we wouldn't consider arteriography and revascularization if he developed disabling claudication, rest pain, or nonhealing ulcer. He knows to call sooner if he has problems.     DICKSON,CHRISTOPHER S [facility] [place] Beeper: [phone]

## 2013-07-12 ENCOUNTER — Encounter: Payer: Self-pay | Admitting: Physician Assistant

## 2013-07-12 ENCOUNTER — Encounter: Payer: Self-pay | Admitting: Cardiovascular Disease

## 2013-07-12 DIAGNOSIS — R079 Chest pain, unspecified: Secondary | ICD-10-CM

## 2013-07-13 DIAGNOSIS — R0602 Shortness of breath: Secondary | ICD-10-CM

## 2013-07-13 DIAGNOSIS — R079 Chest pain, unspecified: Secondary | ICD-10-CM

## 2013-07-13 DIAGNOSIS — I2510 Atherosclerotic heart disease of native coronary artery without angina pectoris: Secondary | ICD-10-CM

## 2013-07-20 ENCOUNTER — Telehealth: Payer: Self-pay

## 2013-07-20 NOTE — Telephone Encounter (Signed)
Phone call from pt.; c/o increased discomfort in calves of both legs, with walking to mailbox.  States has occas. pain in foot @ rest; describes like a pin prick that comes and goes.  Denies any open sores.  Denies any numbness or tingling.  States has occasional cool temperature of toes, that comes and goes.  Reported that Joshua Horton stopped smoking in May.  Requests an earlier appt., than the scheduled follow up in November, due to the increase in pain.  Advised will return call to pt. with earlier appt. option.

## 2013-07-24 ENCOUNTER — Encounter: Payer: Self-pay | Admitting: Vascular Surgery

## 2013-07-25 ENCOUNTER — Ambulatory Visit (INDEPENDENT_AMBULATORY_CARE_PROVIDER_SITE_OTHER): Payer: Medicare Other | Admitting: Vascular Surgery

## 2013-07-25 ENCOUNTER — Encounter: Payer: Self-pay | Admitting: Vascular Surgery

## 2013-07-25 ENCOUNTER — Encounter (INDEPENDENT_AMBULATORY_CARE_PROVIDER_SITE_OTHER): Payer: Medicare Other | Admitting: *Deleted

## 2013-07-25 VITALS — BP 108/72 | HR 90 | Ht 70.0 in | Wt 214.0 lb

## 2013-07-25 DIAGNOSIS — I70219 Atherosclerosis of native arteries of extremities with intermittent claudication, unspecified extremity: Secondary | ICD-10-CM

## 2013-07-25 DIAGNOSIS — I739 Peripheral vascular disease, unspecified: Secondary | ICD-10-CM

## 2013-07-25 NOTE — Progress Notes (Signed)
[facility] [place]  Patient name: Joshua Horton MRN: [id number] DOB: May 24, 1946 Sex: male  REASON FOR VISIT: follow up of peripheral vascular disease.  HPI: Joshua Horton is a 67 y.o. male who I last saw on 12/27/2012. He has a known left common iliac artery occlusion based on an arteriogram done in January of 2008. I last saw him in February with bilateral lower extremity claudication. We have discussed the importance of tobacco cessation and a structured walking program. He comes in for a 9 month follow up visit. He continues to have pain in both lower extremities associated with ambulation and relieved with rest. His symptoms involve his calves, thighs, and hips bilaterally. His symptoms occur at approximately one block. He denies any history of rest pain or history of nonhealing ulcers.  Of note, he quit smoking in May of this year. He had smoked 1-1-1/2 packs per day of cigarettes.  Past Medical History  Diagnosis Date  . Coronary artery disease   . Wheezing   . Chronic cough   . Peripheral vascular disease   . Hypertension   . Hyperlipidemia   . Reflux   . COPD (chronic obstructive pulmonary disease)    Family History  Problem Relation Age of Onset  . Heart disease    . Arthritis    . Cancer    . Kidney disease    . Cancer Mother   . Deep vein thrombosis Father   . Heart disease Father   . Hyperlipidemia Father   . Hypertension Father   . Heart attack Father   . Peripheral vascular disease Father    SOCIAL HISTORY: History  Substance Use Topics  . Smoking status: Former Smoker -- 1.00 packs/day    Types: Cigarettes    Quit date: 04/03/2013  . Smokeless tobacco: Never Used  . Alcohol Use: No   Allergies  Allergen Reactions  . Ivp Dye [Iodinated Diagnostic Agents]    Current Outpatient Prescriptions  Medication Sig Dispense Refill  . aspirin 81 MG tablet Take 81 mg by mouth daily.      . isosorbide mononitrate (IMDUR) 30 MG 24 hr  tablet Take 1 tablet by mouth daily.      Marland Kitchen lisinopril-hydrochlorothiazide (PRINZIDE,ZESTORETIC) 10-12.5 MG per tablet Take 1 tablet by mouth daily.      Marland Kitchen NITROSTAT 0.4 MG SL tablet Take by mouth as needed.      Marland Kitchen omeprazole (PRILOSEC) 20 MG capsule Take 20 mg by mouth daily.      . simvastatin (ZOCOR) 40 MG tablet Take 40 mg by mouth every evening.       No current facility-administered medications for this visit.   REVIEW OF SYSTEMS: Arly.Keller ] denotes positive finding; [  ] denotes negative finding  CARDIOVASCULAR:  [ ]  chest pain   [ ]  chest pressure   [ ]  palpitations   [ ]  orthopnea   Arly.Keller ] dyspnea on exertion   Arly.Keller ] claudication   [ ]  rest pain   [ ]  DVT   [ ]  phlebitis PULMONARY:   [ ]  productive cough   [ ]  asthma   Arly.Keller ] wheezing NEUROLOGIC:   [ ]  weakness  [ ]  paresthesias  [ ]  aphasia  [ ]  amaurosis  [ ]  dizziness HEMATOLOGIC:   [ ]  bleeding problems   [ ]  clotting disorders MUSCULOSKELETAL:  [ ]  joint pain   [ ]  joint swelling [ ]  leg swelling GASTROINTESTINAL: [ ]   blood  in stool  [ ]   hematemesis GENITOURINARY:  Arly.Keller ]  dysuria  [ ]   hematuria PSYCHIATRIC:  [ ]  history of major depression INTEGUMENTARY:  [ ]  rashes  [ ]  ulcers CONSTITUTIONAL:  [ ]  fever   [ ]  chills  PHYSICAL EXAM: Filed Vitals:   07/25/13 1125  BP: 108/72  Pulse: 90  Height: 5\' 10"  (1.778 m)  Weight: 214 lb (97.07 kg)  SpO2: 96%   Body mass index is 30.71 kg/(m^2). GENERAL: The patient is a well-nourished male, in no acute distress. The vital signs are documented above. CARDIOVASCULAR: There is a regular rate and rhythm. I do not detect carotid bruits. I cannot palpate femoral, popliteal, or pedal pulses bilaterally. He has no significant lower extremity swelling. PULMONARY: There is good air exchange bilaterally without wheezing or rales. ABDOMEN: Soft and non-tender with normal pitched bowel sounds.  MUSCULOSKELETAL: There are no major deformities or cyanosis. NEUROLOGIC: No focal weakness or paresthesias  are detected. SKIN: There are no ulcers or rashes noted. PSYCHIATRIC: The patient has a normal affect.  DATA:  I have independently interpreted his arterial Doppler study. This shows monophasic Doppler signals in both feet in the dorsalis pedis and posterior tibial positions. ABI on the right is 59% which has not changed compared to the study in February. ABI on the left is 57% which has increased slightly from 51% in February of this year.  MEDICAL ISSUES: Based on his exam he has evidence of severe aortoiliac occlusive disease. Fortunately he has quit smoking I have encouraged him to stay off of the cigarettes. We have also discussed the importance of a structured walking program. If his symptoms progress he would likely require aortofemoral bypass grafting. He would like to avoid this if at all possible and I would agree with this. I'll see him back in 6 months. He knows to call sooner if he has problems.  Charmine Bockrath S [facility] [place] Beeper: [phone]

## 2013-07-26 ENCOUNTER — Ambulatory Visit (INDEPENDENT_AMBULATORY_CARE_PROVIDER_SITE_OTHER): Payer: Medicare Other | Admitting: Cardiovascular Disease

## 2013-07-26 ENCOUNTER — Encounter: Payer: Self-pay | Admitting: *Deleted

## 2013-07-26 ENCOUNTER — Encounter: Payer: Self-pay | Admitting: Cardiovascular Disease

## 2013-07-26 VITALS — BP 139/80 | HR 71 | Ht 70.0 in | Wt 214.0 lb

## 2013-07-26 DIAGNOSIS — I10 Essential (primary) hypertension: Secondary | ICD-10-CM

## 2013-07-26 DIAGNOSIS — Z8659 Personal history of other mental and behavioral disorders: Secondary | ICD-10-CM

## 2013-07-26 DIAGNOSIS — Z9861 Coronary angioplasty status: Secondary | ICD-10-CM

## 2013-07-26 DIAGNOSIS — I739 Peripheral vascular disease, unspecified: Secondary | ICD-10-CM

## 2013-07-26 DIAGNOSIS — Z87891 Personal history of nicotine dependence: Secondary | ICD-10-CM

## 2013-07-26 DIAGNOSIS — R0609 Other forms of dyspnea: Secondary | ICD-10-CM

## 2013-07-26 DIAGNOSIS — I2510 Atherosclerotic heart disease of native coronary artery without angina pectoris: Secondary | ICD-10-CM

## 2013-07-26 DIAGNOSIS — E785 Hyperlipidemia, unspecified: Secondary | ICD-10-CM

## 2013-07-26 NOTE — Patient Instructions (Signed)
Your physician has requested that you have a lexiscan myoview. For further information please visit [email or web address] Please follow instruction sheet, as given. Office will contact with results via phone or letter.    Stop Imdur Continue all other medications.   Follow up in 2-3 weeks

## 2013-07-26 NOTE — Progress Notes (Signed)
Patient ID: Joshua Horton, male   DOB: June 12, 1946, 67 y.o.   MRN: [id number]     SUBJECTIVE: Joshua Horton has a h/o known CAD with a h/o NSTEMI in April 2003. At that time, he underwent cardiac catheterization which revealed the following:  1. Left ventriculogram; Normal left ventricular ejection fraction and no  wall motion abnormalities.  2. Left main coronary artery; free of critical disease.  3. Left anterior descending; 50% segmental narrowing after the origin of the  diagonal, diagonal was noted to have 40% ostial narrowing and some mild  diffuse luminal irregularities, there is also a 40% segmental narrowing in  the mid LAD.  4. The circumflex had a 70% narrowing prior to the bifurcation of the AV  circumflex and marginal, the marginal then had a 50% segmental narrowing  followed by a 90% focal narrowing. The AV circumflex itself had a 75%  narrowing.  5. Right coronary artery had 50% proximal narrowing as well as diffuse 30%  narrowing as well as 50% narrowing prior to the takeoff of the PDA.  Dr. Riley Kill then performed angioplasty and stenting to the 70% lesion in the  circumflex. He then performed an angioplasty of the AV circumflex passing  through the previously placed stent. TIMI 3 flow resulted in all vessels.  He also has HTN, hyperlipidemia, and tobacco abuse. He has known PVD and follows with a vascular surgeon, with left common iliac artery occlusion. It was recommended he begin a structured walking program and quit smoking. He quit in May of this year.  He had an echocardiogram on 07-12-2013 which showed normal LV systolic function, EF 60-65%, mild LVH, and grade I diastolic dysfunction. An ECG on that same date showed normal sinus rhythm.  I saw him in consultation recently at [facility] for progressive dyspnea on exertion. He ruled out for an ACS and I recommended outpatient stress testing, as this appeared to be his anginal equivalent in the context of his known CAD, which  has most certainly progressed. I prescribed Imdur but it has been giving him severe headaches.  His insurance company apparently did not agree to pay for the stress test for reasons unclear to me.  He continues to experience dyspnea with exertion, but denies chest pain.    Allergies  Allergen Reactions  . Ivp Dye [Iodinated Diagnostic Agents]     Current Outpatient Prescriptions  Medication Sig Dispense Refill  . aspirin 81 MG tablet Take 81 mg by mouth daily.      . isosorbide mononitrate (IMDUR) 30 MG 24 hr tablet Take 1 tablet by mouth daily.      Marland Kitchen lisinopril-hydrochlorothiazide (PRINZIDE,ZESTORETIC) 10-12.5 MG per tablet Take 1 tablet by mouth daily.      Marland Kitchen NITROSTAT 0.4 MG SL tablet Take by mouth as needed.      Marland Kitchen omeprazole (PRILOSEC) 20 MG capsule Take 20 mg by mouth daily.      . simvastatin (ZOCOR) 40 MG tablet Take 40 mg by mouth every evening.       No current facility-administered medications for this visit.    Past Medical History  Diagnosis Date  . Coronary artery disease   . Wheezing   . Chronic cough   . Peripheral vascular disease   . Hypertension   . Hyperlipidemia   . Reflux   . COPD (chronic obstructive pulmonary disease)     Past Surgical History  Procedure Laterality Date  . Coronary angioplasty with stent placement  2003  . Cervical  disc surgery      Dr Venetia Maxon    History   Social History  . Marital Status: Married    Spouse Name: N/A    Number of Children: N/A  . Years of Education: N/A   Occupational History  . Not on file.   Social History Main Topics  . Smoking status: Former Smoker -- 1.00 packs/day    Types: Cigarettes    Quit date: 04/03/2013  . Smokeless tobacco: Never Used  . Alcohol Use: No  . Drug Use: No  . Sexual Activity: Not on file   Other Topics Concern  . Not on file   Social History Narrative  . No narrative on file     Filed Vitals:   07/26/13 0856  BP: 139/80  Pulse: 71  Height: 5\' 10"  (1.778 m)   Weight: 214 lb (97.07 kg)    PHYSICAL EXAM General: NAD Neck: No JVD, no thyromegaly or thyroid nodule.  Lungs: Clear to auscultation bilaterally with normal respiratory effort. CV: Nondisplaced PMI.  Heart regular S1/S2, no S3/S4, no murmur.  No peripheral edema.  No carotid bruit.  Diminished pedal pulses.  Abdomen: Soft, nontender, no hepatosplenomegaly, no distention.  Neurologic: Alert and oriented x 3.  Psych: Normal affect. Extremities: No clubbing or cyanosis.   ECG: reviewed and available in electronic records.      ASSESSMENT AND PLAN: 1. Dyspnea on exertion: he continues to experience this, and as stated previously, given his known CAD, it has most likely progressed with this possibly being his anginal equivalent. For this reason, I recommend he undergo a Lexiscan Myoview stress test. I will discontinue his Imdur as he has not tolerated it, and has experienced severe headaches. I will continue ASA and simvastatin. 2. CAD with prior bare metal stenting in 2003: see #1. 3. HTN: relatively well controlled on lisinopril-HCTZ. No dosing adjustments today. 4. Hyperlipidemia: continue simvastatin. 5. PVD: follows up with a vascular surgeon.   Prentice Docker, M.D., F.A.C.C.

## 2013-08-03 ENCOUNTER — Encounter (HOSPITAL_COMMUNITY)
Admission: RE | Admit: 2013-08-03 | Discharge: 2013-08-03 | Disposition: A | Discharge status: Home / Self Care | Payer: Medicare Other | Source: Ambulatory Visit | Attending: Cardiovascular Disease | Admitting: Cardiovascular Disease

## 2013-08-03 ENCOUNTER — Encounter (HOSPITAL_COMMUNITY): Payer: Self-pay

## 2013-08-03 DIAGNOSIS — J449 Chronic obstructive pulmonary disease, unspecified: Secondary | ICD-10-CM | POA: Insufficient documentation

## 2013-08-03 DIAGNOSIS — E785 Hyperlipidemia, unspecified: Secondary | ICD-10-CM | POA: Insufficient documentation

## 2013-08-03 DIAGNOSIS — I10 Essential (primary) hypertension: Secondary | ICD-10-CM | POA: Insufficient documentation

## 2013-08-03 DIAGNOSIS — R0609 Other forms of dyspnea: Secondary | ICD-10-CM | POA: Insufficient documentation

## 2013-08-03 DIAGNOSIS — I2510 Atherosclerotic heart disease of native coronary artery without angina pectoris: Secondary | ICD-10-CM | POA: Insufficient documentation

## 2013-08-03 DIAGNOSIS — J4489 Other specified chronic obstructive pulmonary disease: Secondary | ICD-10-CM | POA: Insufficient documentation

## 2013-08-03 DIAGNOSIS — R0989 Other specified symptoms and signs involving the circulatory and respiratory systems: Secondary | ICD-10-CM | POA: Insufficient documentation

## 2013-08-03 MED ORDER — SODIUM CHLORIDE 0.9 % IJ SOLN
INTRAMUSCULAR | Status: AC
  Administered 2013-08-03: 10 mL via INTRAVENOUS
  Filled 2013-08-03: qty 10

## 2013-08-03 MED ORDER — TECHNETIUM TC 99M SESTAMIBI GENERIC - CARDIOLITE
30.0000 | Freq: Once | INTRAVENOUS | Status: AC | PRN
  Administered 2013-08-03: 30 via INTRAVENOUS

## 2013-08-03 MED ORDER — TECHNETIUM TC 99M SESTAMIBI - CARDIOLITE
10.0000 | Freq: Once | INTRAVENOUS | Status: AC | PRN
  Administered 2013-08-03: 10.5 via INTRAVENOUS

## 2013-08-03 MED ORDER — REGADENOSON 0.4 MG/5ML IV SOLN
INTRAVENOUS | Status: AC
  Administered 2013-08-03: 0.4 mg via INTRAVENOUS
  Filled 2013-08-03: qty 5

## 2013-08-03 NOTE — Progress Notes (Signed)
Stress Lab Nurses Notes - Lesslie Mckeehan [facility] 08/03/2013 Reason for doing test: Dyspnea Type of test: Marlane Hatcher Nurse performing test: Parke Poisson, RN Nuclear Medicine Tech: Lou Cal Echo Tech: Not Applicable MD performing test: Ival Bible / Joni Reining NP Family MD: Dr. Reuel Boom Test explained and consent signed: yes IV started: 22g jelco, Saline lock flushed, No redness or edema and Saline lock started in radiology Symptoms: Chest tightness Treatment/Intervention: None Reason test stopped: protocol completed After recovery IV was: Discontinued via X-ray tech and No redness or edema Patient to return to Nuc. Med at : 11:30 Patient discharged: Home Patient's Condition upon discharge was: stable Comments: During test BP 130/59 & HR 103.  Recovery BP 112/60 & HR 82.  Symptoms resolved in recovery. Erskine Speed T

## 2013-08-07 ENCOUNTER — Ambulatory Visit: Payer: Medicare Other | Admitting: Cardiology

## 2013-08-16 ENCOUNTER — Ambulatory Visit: Payer: Medicare Other | Admitting: Cardiovascular Disease

## 2013-08-27 ENCOUNTER — Encounter: Payer: Self-pay | Admitting: Cardiovascular Disease

## 2013-08-27 ENCOUNTER — Ambulatory Visit (INDEPENDENT_AMBULATORY_CARE_PROVIDER_SITE_OTHER): Payer: Medicare Other | Admitting: Cardiovascular Disease

## 2013-08-27 VITALS — BP 120/72 | HR 87 | Ht 70.0 in | Wt 216.0 lb

## 2013-08-27 DIAGNOSIS — Z9861 Coronary angioplasty status: Secondary | ICD-10-CM

## 2013-08-27 DIAGNOSIS — Z8659 Personal history of other mental and behavioral disorders: Secondary | ICD-10-CM

## 2013-08-27 DIAGNOSIS — E785 Hyperlipidemia, unspecified: Secondary | ICD-10-CM

## 2013-08-27 DIAGNOSIS — I10 Essential (primary) hypertension: Secondary | ICD-10-CM

## 2013-08-27 DIAGNOSIS — R0609 Other forms of dyspnea: Secondary | ICD-10-CM

## 2013-08-27 DIAGNOSIS — J449 Chronic obstructive pulmonary disease, unspecified: Secondary | ICD-10-CM | POA: Insufficient documentation

## 2013-08-27 DIAGNOSIS — R0989 Other specified symptoms and signs involving the circulatory and respiratory systems: Secondary | ICD-10-CM

## 2013-08-27 DIAGNOSIS — Z87891 Personal history of nicotine dependence: Secondary | ICD-10-CM

## 2013-08-27 DIAGNOSIS — I739 Peripheral vascular disease, unspecified: Secondary | ICD-10-CM

## 2013-08-27 DIAGNOSIS — J4489 Other specified chronic obstructive pulmonary disease: Secondary | ICD-10-CM

## 2013-08-27 DIAGNOSIS — I2510 Atherosclerotic heart disease of native coronary artery without angina pectoris: Secondary | ICD-10-CM

## 2013-08-27 NOTE — Addendum Note (Signed)
Addended by: Lesle Chris on: 08/27/2013 11:30 AM   Modules accepted: Orders

## 2013-08-27 NOTE — Patient Instructions (Signed)
Continue all current medications. Your physician has recommended that you have a pulmonary function test. Pulmonary Function Tests are a group of tests that measure how well air moves in and out of your lungs. Your physician wants you to follow up in: 6 months.  You will receive a reminder letter in the mail one-two months in advance.  If you don't receive a letter, please call our office to schedule the follow up appointment

## 2013-08-27 NOTE — Progress Notes (Signed)
Patient ID: Joshua Horton, male   DOB: [birth year], 67 y.o.   MRN: [id number]      SUBJECTIVE: Joshua Horton has a h/o known CAD with a h/o NSTEMI in April 2003. At that time, he underwent cardiac catheterization which revealed the following:  1. Left ventriculogram; Normal left ventricular ejection fraction and no  wall motion abnormalities.  2. Left main coronary artery; free of critical disease.  3. Left anterior descending; 50% segmental narrowing after the origin of the  diagonal, diagonal was noted to have 40% ostial narrowing and some mild  diffuse luminal irregularities, there is also a 40% segmental narrowing in  the mid LAD.  4. The circumflex had a 70% narrowing prior to the bifurcation of the AV  circumflex and marginal, the marginal then had a 50% segmental narrowing  followed by a 90% focal narrowing. The AV circumflex itself had a 75%  narrowing.  5. Right coronary artery had 50% proximal narrowing as well as diffuse 30%  narrowing as well as 50% narrowing prior to the takeoff of the PDA.   Dr. Riley Kill then performed angioplasty and stenting to the 70% lesion in the  circumflex. He then performed an angioplasty of the AV circumflex passing  through the previously placed stent. TIMI 3 flow resulted in all vessels.   He also has HTN, hyperlipidemia, and tobacco abuse. He has known PVD and follows with a vascular surgeon, with left common iliac artery occlusion. It was recommended he begin a structured walking program and quit smoking. He quit in May of this year.   He had an echocardiogram on 07-12-2013 which showed normal LV systolic function, EF 60-65%, mild LVH, and grade I diastolic dysfunction. An ECG on that same date showed normal sinus rhythm.  I saw him in consultation recently at [facility] for progressive dyspnea on exertion. He ruled out for an ACS and I recommended outpatient stress testing, as this appeared to be his anginal equivalent in the context of his known CAD. I  prescribed Imdur but it had been giving him severe headaches, so it was discontinued.  He returns for f/u after his Lexiscan Cardiolite stress test, which was deemed low risk with no evidence for inducible ischemia.  He denies chest pain but continues to have intermittent dyspnea with exertion. He denies syncope and leg swelling.       Allergies  Allergen Reactions  . Ivp Dye [Iodinated Diagnostic Agents]     Current Outpatient Prescriptions  Medication Sig Dispense Refill  . aspirin 81 MG tablet Take 81 mg by mouth daily.      Marland Kitchen lisinopril-hydrochlorothiazide (PRINZIDE,ZESTORETIC) 10-12.5 MG per tablet Take 1 tablet by mouth daily.      Marland Kitchen NITROSTAT 0.4 MG SL tablet Take by mouth as needed.      Marland Kitchen omeprazole (PRILOSEC) 20 MG capsule Take 20 mg by mouth daily.      . simvastatin (ZOCOR) 40 MG tablet Take 40 mg by mouth every evening.       No current facility-administered medications for this visit.    Past Medical History  Diagnosis Date  . Coronary artery disease   . Wheezing   . Chronic cough   . Peripheral vascular disease   . Hypertension   . Hyperlipidemia   . Reflux   . COPD (chronic obstructive pulmonary disease)     Past Surgical History  Procedure Laterality Date  . Coronary angioplasty with stent placement  2003  . Cervical disc surgery  Dr Venetia Maxon    History   Social History  . Marital Status: Married    Spouse Name: N/A    Number of Children: N/A  . Years of Education: N/A   Occupational History  . Not on file.   Social History Main Topics  . Smoking status: Former Smoker -- 1.00 packs/day    Types: Cigarettes    Quit date: 04/03/2013  . Smokeless tobacco: Never Used  . Alcohol Use: No  . Drug Use: No  . Sexual Activity: Not on file   Other Topics Concern  . Not on file   Social History Narrative  . No narrative on file     Filed Vitals:   08/27/13 1037  BP: 120/72  Pulse: 87  Height: 5\' 10"  (1.778 m)  Weight: 216 lb (97.977  kg)    PHYSICAL EXAM General: NAD Neck: No JVD, no thyromegaly or thyroid nodule.  Lungs: Clear to auscultation bilaterally with normal respiratory effort. CV: Nondisplaced PMI.  Heart regular S1/S2, no S3/S4, no murmur.  No peripheral edema.  No carotid bruit.  Normal pedal pulses.  Abdomen: Soft, nontender, no hepatosplenomegaly, no distention.  Neurologic: Alert and oriented x 3.  Psych: Normal affect. Extremities: No clubbing or cyanosis.   ECG: reviewed and available in electronic records.      ASSESSMENT AND PLAN: 1. Dyspnea on exertion: he continues to experience this, and given the xray findings of stable COPD and his prior work in a cigarette factory, I recommend PFT's. If he indeed has COPD, beta agonist and/or anticholinergic therapy would be helpful. 2. CAD with prior bare metal stenting in 2003: symptomatically stable and normal stress test results. Continue ASA and statin. 3. HTN: well controlled on lisinopril-HCTZ. No dosing adjustments today.  4. Hyperlipidemia: continue simvastatin.  5. PVD: follows up with a vascular surgeon.  Dispo: f/u 6 months.     Prentice Docker, M.D., F.A.C.C.

## 2013-08-28 ENCOUNTER — Ambulatory Visit (HOSPITAL_COMMUNITY)
Admission: RE | Admit: 2013-08-28 | Discharge: 2013-08-28 | Disposition: A | Discharge status: Home / Self Care | Payer: Medicare Other | Source: Ambulatory Visit | Attending: Cardiovascular Disease | Admitting: Cardiovascular Disease

## 2013-08-28 DIAGNOSIS — R0609 Other forms of dyspnea: Secondary | ICD-10-CM | POA: Insufficient documentation

## 2013-08-28 DIAGNOSIS — J4489 Other specified chronic obstructive pulmonary disease: Secondary | ICD-10-CM | POA: Insufficient documentation

## 2013-08-28 DIAGNOSIS — R0989 Other specified symptoms and signs involving the circulatory and respiratory systems: Secondary | ICD-10-CM | POA: Insufficient documentation

## 2013-08-28 DIAGNOSIS — J449 Chronic obstructive pulmonary disease, unspecified: Secondary | ICD-10-CM | POA: Insufficient documentation

## 2013-08-28 LAB — BLOOD GAS, ARTERIAL
Bicarbonate: 26.4 mEq/L — ABNORMAL HIGH (ref 20.0–24.0)
FIO2: 0.21 %
O2 Saturation: 94.2 %
pCO2 arterial: 43.9 mmHg (ref 35.0–45.0)
pO2, Arterial: 72 mmHg — ABNORMAL LOW (ref 80.0–100.0)

## 2013-08-28 MED ORDER — ALBUTEROL SULFATE (5 MG/ML) 0.5% IN NEBU
2.5000 mg | INHALATION_SOLUTION | Freq: Once | RESPIRATORY_TRACT | Status: AC
  Administered 2013-08-28: 2.5 mg via RESPIRATORY_TRACT

## 2013-08-29 NOTE — Procedures (Signed)
[name], Joshua Horton              ACCOUNT NO.:  [id number]  MEDICAL RECORD NO.:  [id number]  LOCATION:  RESP                          FACILITY:  [facility]  PHYSICIAN:  Javar Eshbach L. Juanetta Gosling, M.D.DATE OF BIRTH:  [birth year]  DATE OF PROCEDURE: DATE OF DISCHARGE:  08/28/2013                           PULMONARY FUNCTION TEST   REASON FOR PULMONARY FUNCTION TESTING:  COPD. 1. Spirometry shows a moderate ventilatory defect with airflow     obstruction most marked in the smaller airways. 2. Lung volumes show significant air trapping. 3. DLCO is mildly reduced. 4. Airway resistance is elevated confirming the presence of airflow     obstruction. 5. There is significant bronchodilator improvement. 6. Arterial blood gas is normal. 7. This study is consistent with the clinical diagnosis of COPD.     Ardith Lewman L. Juanetta Gosling, M.D.     ELH/MEDQ  D:  08/28/2013  T:  08/29/2013  Job:  [id number]  cc:   Laqueta Linden

## 2013-09-03 ENCOUNTER — Telehealth: Payer: Self-pay | Admitting: *Deleted

## 2013-09-03 NOTE — Telephone Encounter (Signed)
He indeed has COPD as suspected. Would defer treatment to his PCP.

## 2013-09-03 NOTE — Telephone Encounter (Signed)
Wife called requesting PFT results.  Looks like they are available, but was not routed to you.

## 2013-09-04 LAB — PULMONARY FUNCTION TEST

## 2013-09-04 NOTE — Telephone Encounter (Signed)
Patient notified and verbalized understanding.  Will forward PFT results to PMD.

## 2013-09-26 ENCOUNTER — Ambulatory Visit: Payer: Medicare Other | Admitting: Vascular Surgery

## 2013-11-16 ENCOUNTER — Inpatient Hospital Stay (HOSPITAL_COMMUNITY): Payer: Medicare Other

## 2013-11-16 ENCOUNTER — Inpatient Hospital Stay (HOSPITAL_COMMUNITY)
Admission: EM | Admit: 2013-11-16 | Discharge: 2013-11-21 | DRG: 040 | Disposition: A | Discharge status: Rehabilitation Facility | Payer: Medicare Other | Attending: Neurology | Admitting: Neurology

## 2013-11-16 ENCOUNTER — Emergency Department (HOSPITAL_COMMUNITY): Payer: Medicare Other

## 2013-11-16 ENCOUNTER — Encounter (HOSPITAL_COMMUNITY): Payer: Self-pay | Admitting: Radiology

## 2013-11-16 DIAGNOSIS — M1990 Unspecified osteoarthritis, unspecified site: Secondary | ICD-10-CM | POA: Diagnosis present

## 2013-11-16 DIAGNOSIS — I2510 Atherosclerotic heart disease of native coronary artery without angina pectoris: Secondary | ICD-10-CM | POA: Diagnosis present

## 2013-11-16 DIAGNOSIS — I519 Heart disease, unspecified: Secondary | ICD-10-CM

## 2013-11-16 DIAGNOSIS — I6350 Cerebral infarction due to unspecified occlusion or stenosis of unspecified cerebral artery: Secondary | ICD-10-CM

## 2013-11-16 DIAGNOSIS — I252 Old myocardial infarction: Secondary | ICD-10-CM

## 2013-11-16 DIAGNOSIS — G8190 Hemiplegia, unspecified affecting unspecified side: Secondary | ICD-10-CM | POA: Diagnosis present

## 2013-11-16 DIAGNOSIS — I739 Peripheral vascular disease, unspecified: Secondary | ICD-10-CM | POA: Diagnosis present

## 2013-11-16 DIAGNOSIS — I6340 Cerebral infarction due to embolism of unspecified cerebral artery: Principal | ICD-10-CM | POA: Diagnosis present

## 2013-11-16 DIAGNOSIS — G4733 Obstructive sleep apnea (adult) (pediatric): Secondary | ICD-10-CM | POA: Diagnosis present

## 2013-11-16 DIAGNOSIS — I10 Essential (primary) hypertension: Secondary | ICD-10-CM | POA: Diagnosis present

## 2013-11-16 DIAGNOSIS — J4489 Other specified chronic obstructive pulmonary disease: Secondary | ICD-10-CM | POA: Diagnosis present

## 2013-11-16 DIAGNOSIS — K219 Gastro-esophageal reflux disease without esophagitis: Secondary | ICD-10-CM | POA: Diagnosis present

## 2013-11-16 DIAGNOSIS — E785 Hyperlipidemia, unspecified: Secondary | ICD-10-CM | POA: Diagnosis present

## 2013-11-16 DIAGNOSIS — Z79899 Other long term (current) drug therapy: Secondary | ICD-10-CM

## 2013-11-16 DIAGNOSIS — Z951 Presence of aortocoronary bypass graft: Secondary | ICD-10-CM

## 2013-11-16 DIAGNOSIS — Z9861 Coronary angioplasty status: Secondary | ICD-10-CM

## 2013-11-16 DIAGNOSIS — J449 Chronic obstructive pulmonary disease, unspecified: Secondary | ICD-10-CM | POA: Diagnosis present

## 2013-11-16 DIAGNOSIS — R471 Dysarthria and anarthria: Secondary | ICD-10-CM | POA: Diagnosis present

## 2013-11-16 DIAGNOSIS — R1310 Dysphagia, unspecified: Secondary | ICD-10-CM | POA: Diagnosis present

## 2013-11-16 DIAGNOSIS — I619 Nontraumatic intracerebral hemorrhage, unspecified: Secondary | ICD-10-CM | POA: Diagnosis present

## 2013-11-16 DIAGNOSIS — I639 Cerebral infarction, unspecified: Secondary | ICD-10-CM

## 2013-11-16 DIAGNOSIS — I658 Occlusion and stenosis of other precerebral arteries: Secondary | ICD-10-CM | POA: Diagnosis present

## 2013-11-16 DIAGNOSIS — R7309 Other abnormal glucose: Secondary | ICD-10-CM | POA: Diagnosis present

## 2013-11-16 DIAGNOSIS — Z86718 Personal history of other venous thrombosis and embolism: Secondary | ICD-10-CM

## 2013-11-16 DIAGNOSIS — Z7982 Long term (current) use of aspirin: Secondary | ICD-10-CM

## 2013-11-16 DIAGNOSIS — I6529 Occlusion and stenosis of unspecified carotid artery: Secondary | ICD-10-CM | POA: Diagnosis present

## 2013-11-16 DIAGNOSIS — Z87891 Personal history of nicotine dependence: Secondary | ICD-10-CM

## 2013-11-16 HISTORY — DX: Heart failure, unspecified: I50.9

## 2013-11-16 HISTORY — DX: Cerebral infarction due to embolism of unspecified cerebral artery: I63.40

## 2013-11-16 HISTORY — DX: Acute myocardial infarction, unspecified: I21.9

## 2013-11-16 LAB — DIFFERENTIAL
BASOS ABS: 0 10*3/uL (ref 0.0–0.1)
BASOS PCT: 0 % (ref 0–1)
EOS ABS: 0.1 10*3/uL (ref 0.0–0.7)
Eosinophils Relative: 2 % (ref 0–5)
Lymphocytes Relative: 41 % (ref 12–46)
Lymphs Abs: 3 10*3/uL (ref 0.7–4.0)
MONO ABS: 0.6 10*3/uL (ref 0.1–1.0)
MONOS PCT: 9 % (ref 3–12)
Neutro Abs: 3.6 10*3/uL (ref 1.7–7.7)
Neutrophils Relative %: 49 % (ref 43–77)

## 2013-11-16 LAB — GLUCOSE, CAPILLARY: Glucose-Capillary: 141 mg/dL — ABNORMAL HIGH (ref 70–99)

## 2013-11-16 LAB — TROPONIN I

## 2013-11-16 LAB — POCT I-STAT, CHEM 8
BUN: 8 mg/dL (ref 6–23)
CHLORIDE: 100 meq/L (ref 96–112)
CREATININE: 0.9 mg/dL (ref 0.50–1.35)
Calcium, Ion: 1.13 mmol/L (ref 1.13–1.30)
Glucose, Bld: 136 mg/dL — ABNORMAL HIGH (ref 70–99)
HCT: 50 % (ref 39.0–52.0)
Hemoglobin: 17 g/dL (ref 13.0–17.0)
Potassium: 3.8 mEq/L (ref 3.7–5.3)
Sodium: 135 mEq/L — ABNORMAL LOW (ref 137–147)
TCO2: 27 mmol/L (ref 0–100)

## 2013-11-16 LAB — POCT I-STAT TROPONIN I: Troponin i, poc: 0 ng/mL (ref 0.00–0.08)

## 2013-11-16 LAB — COMPREHENSIVE METABOLIC PANEL
ALBUMIN: 3.9 g/dL (ref 3.5–5.2)
ALT: 37 U/L (ref 0–53)
AST: 30 U/L (ref 0–37)
Alkaline Phosphatase: 105 U/L (ref 39–117)
BILIRUBIN TOTAL: 0.3 mg/dL (ref 0.3–1.2)
BUN: 9 mg/dL (ref 6–23)
CALCIUM: 9.2 mg/dL (ref 8.4–10.5)
CHLORIDE: 96 meq/L (ref 96–112)
CO2: 25 mEq/L (ref 19–32)
CREATININE: 0.83 mg/dL (ref 0.50–1.35)
GFR calc Af Amer: >90 mL/min (ref >90)
GFR calc non Af Amer: 88 mL/min — ABNORMAL LOW (ref >90)
Glucose, Bld: 135 mg/dL — ABNORMAL HIGH (ref 70–99)
Potassium: 4 mEq/L (ref 3.7–5.3)
Sodium: 135 mEq/L — ABNORMAL LOW (ref 137–147)
TOTAL PROTEIN: 7.5 g/dL (ref 6.0–8.3)

## 2013-11-16 LAB — LIPID PANEL
Cholesterol: 169 mg/dL (ref 0–200)
HDL: 32 mg/dL — ABNORMAL LOW (ref >39)
LDL Cholesterol: 94 mg/dL (ref 0–99)
Total CHOL/HDL Ratio: 5.3 RATIO
Triglycerides: 215 mg/dL — ABNORMAL HIGH (ref <150)
VLDL: 43 mg/dL — ABNORMAL HIGH (ref 0–40)

## 2013-11-16 LAB — CBC
HEMATOCRIT: 46.5 % (ref 39.0–52.0)
HEMOGLOBIN: 15.8 g/dL (ref 13.0–17.0)
MCH: 29.8 pg (ref 26.0–34.0)
MCHC: 34 g/dL (ref 30.0–36.0)
MCV: 87.6 fL (ref 78.0–100.0)
Platelets: 216 10*3/uL (ref 150–400)
RBC: 5.31 MIL/uL (ref 4.22–5.81)
RDW: 13.7 % (ref 11.5–15.5)
WBC: 7.4 10*3/uL (ref 4.0–10.5)

## 2013-11-16 LAB — MRSA PCR SCREENING: MRSA by PCR: NEGATIVE

## 2013-11-16 LAB — ETHANOL

## 2013-11-16 LAB — HEMOGLOBIN A1C
Hgb A1c MFr Bld: 6.5 % — ABNORMAL HIGH (ref <5.7)
MEAN PLASMA GLUCOSE: 140 mg/dL — AB (ref <117)

## 2013-11-16 LAB — PROTIME-INR
INR: 0.92 (ref 0.00–1.49)
Prothrombin Time: 12.2 seconds (ref 11.6–15.2)

## 2013-11-16 LAB — APTT: aPTT: 28 seconds (ref 24–37)

## 2013-11-16 MED ORDER — LABETALOL HCL 5 MG/ML IV SOLN: 10.0000 mg | INTRAVENOUS | Status: DC | PRN

## 2013-11-16 MED ORDER — ACETAMINOPHEN 650 MG RE SUPP: 650.0000 mg | RECTAL | Status: DC | PRN

## 2013-11-16 MED ORDER — SODIUM CHLORIDE 0.9 % IV SOLN
INTRAVENOUS | Status: DC
  Administered 2013-11-16 – 2013-11-18 (×4): via INTRAVENOUS

## 2013-11-16 MED ORDER — STROKE: EARLY STAGES OF RECOVERY BOOK
Freq: Once | Status: AC
  Administered 2013-11-16: 03:00:00
  Filled 2013-11-16: qty 1

## 2013-11-16 MED ORDER — PANTOPRAZOLE SODIUM 40 MG IV SOLR
40.0000 mg | Freq: Every day | INTRAVENOUS | Status: DC
  Administered 2013-11-16 – 2013-11-19 (×4): 40 mg via INTRAVENOUS
  Filled 2013-11-16 (×6): qty 40

## 2013-11-16 MED ORDER — SODIUM CHLORIDE 0.9 % IV SOLN
250.0000 mL | Freq: Once | INTRAVENOUS | Status: AC
  Administered 2013-11-16: 250 mL via INTRAVENOUS

## 2013-11-16 MED ORDER — ALTEPLASE (STROKE) FULL DOSE INFUSION
0.9000 mg/kg | Freq: Once | INTRAVENOUS | Status: AC
  Administered 2013-11-16: 86 mg via INTRAVENOUS
  Filled 2013-11-16: qty 86

## 2013-11-16 MED ORDER — ACETAMINOPHEN 325 MG PO TABS: 650.0000 mg | ORAL_TABLET | ORAL | Status: DC | PRN

## 2013-11-16 MED ORDER — SENNOSIDES-DOCUSATE SODIUM 8.6-50 MG PO TABS
1.0000 | ORAL_TABLET | Freq: Every evening | ORAL | Status: DC | PRN
  Filled 2013-11-16: qty 1

## 2013-11-16 NOTE — H&P (Signed)
Admission H&P    Chief Complaint: Difficulty with speech, left sided weakness  HPI: Joshua Horton is an 68 y.o. male who was at home with his wife this evening.  While speaking with his wife had acute onset difficulty with speech.  Then noted weakness on his left side as well.  EMS was called at that time and the patient was brought in as a code stroke.  CBG 118.  Initial NIHSS of 5. The patient reports that he had a similar episode on 11/12/2013.  His symptoms resolved completely after about 30 minutes and the patient refused transport by EMS.  Medical attention was not sought.    Date last known well: Date: 11/15/2012 Time last known well: Time: 22:15 tPA Given: Yes  Past Medical History  Diagnosis Date  . Coronary artery disease   . Wheezing   . Chronic cough   . Peripheral vascular disease   . Hypertension   . Hyperlipidemia   . Reflux   . COPD (chronic obstructive pulmonary disease)     Past Surgical History  Procedure Laterality Date  . Coronary angioplasty with stent placement  2003  . Cervical disc surgery      Dr Vertell Limber    Family History  Problem Relation Age of Onset  . Heart disease    . Arthritis    . Cancer    . Kidney disease    . Cancer Mother   . Deep vein thrombosis Father   . Heart disease Father   . Hyperlipidemia Father   . Hypertension Father   . Heart attack Father   . Peripheral vascular disease Father    Social History:  reports that he quit smoking about 7 months ago. His smoking use included Cigarettes. He smoked 1.00 pack per day. He has never used smokeless tobacco. He reports that he does not drink alcohol or use illicit drugs.  Allergies:  Allergies  Allergen Reactions  . Ivp Dye [Iodinated Diagnostic Agents]     Medications: Current outpatient prescriptions: aspirin 81 MG tablet, Take 81 mg by mouth daily., Disp: , Rfl: ;   lisinopril-hydrochlorothiazide (PRINZIDE,ZESTORETIC) 10-12.5 MG per tablet, Take 1 tablet by mouth daily.,  Disp: , Rfl: ;  NITROSTAT 0.4 MG SL tablet, Take by mouth as needed., Disp: , Rfl: ;   omeprazole (PRILOSEC) 20 MG capsule, Take 20 mg by mouth daily., Disp: , Rfl:  simvastatin (ZOCOR) 40 MG tablet, Take 40 mg by mouth every evening., Disp: , Rfl:   ROS: History obtained from the patient  General ROS: negative for - chills, fatigue, fever, night sweats, weight gain or weight loss Psychological ROS: negative for - behavioral disorder, hallucinations, memory difficulties, mood swings or suicidal ideation Ophthalmic ROS: negative for - blurry vision, double vision, eye pain or loss of vision ENT ROS: negative for - epistaxis, nasal discharge, oral lesions, sore throat, tinnitus or vertigo Allergy and Immunology ROS: negative for - hives or itchy/watery eyes Hematological and Lymphatic ROS: negative for - bleeding problems, bruising or swollen lymph nodes Endocrine ROS: negative for - galactorrhea, hair pattern changes, polydipsia/polyuria or temperature intolerance Respiratory ROS: negative for - cough, hemoptysis, shortness of breath or wheezing Cardiovascular ROS: negative for - chest pain, dyspnea on exertion, edema or irregular heartbeat Gastrointestinal ROS: negative for - abdominal pain, diarrhea, hematemesis, nausea/vomiting or stool incontinence Genito-Urinary ROS: negative for - dysuria, hematuria, incontinence or urinary frequency/urgency Musculoskeletal ROS: negative for - joint swelling or muscular weakness Neurological ROS: as noted in  HPI Dermatological ROS: negative for rash and skin lesion changes  Physical Examination: Blood pressure 174/68, pulse 94, resp. rate 17, height 5\' 10"  (1.778 m), weight 96.2 kg (212 lb 1.3 oz), SpO2 95.00%.  General Examination: HEENT-  Normocephalic, no lesions, without obvious abnormality.  Normal external eye and conjunctiva.  Normal TM's bilaterally.  Normal auditory canals and external ears. Normal external nose, mucus membranes and septum.   Normal pharynx. Neck supple with no masses, nodes, nodules or enlargement. Cardiovascular - S1, S2 normal Lungs - chest clear, no wheezing, rales, normal symmetric air entry Abdomen - soft, non-tender; bowel sounds normal; no masses,  no organomegaly, protuberant Extremities - mild edema in lower extremities bilaterally  Neurologic Examination: Mental Status: Alert, oriented, thought content appropriate.  Speech fluent but markedly dysarthric.  Able to follow 3 step commands without difficulty. Cranial Nerves: II: Discs flat bilaterally; Visual fields grossly normal, pupils equal, round, reactive to light and accommodation III,IV, VI: ptosis not present, extra-ocular motions intact bilaterally V,VII: left facial droop, facial light touch sensation decreased on the left side of the face VIII: hearing normal bilaterally IX,X: gag reflex reduced XI: bilateral shoulder shrug XII: midline tongue extension Motor: Right : Upper extremity   5/5    Left:     Upper extremity   3+/5  Lower extremity   5/5     Lower extremity   5/5 Tone and bulk:normal tone throughout; no atrophy noted Sensory: Pinprick and light touch decreased in the left upper extremity Deep Tendon Reflexes: 1+ and symmetric throughout with absent AJ's bilaterally Plantars: Right: downgoing   Left: upgoing Cerebellar: normal finger-to-nose on the right and normal heel-to-shin testing bilaterally.  Finger to nose difficult on the left due to weakness Gait: Unable to test CV: pulses palpable throughout     Laboratory Studies:   Basic Metabolic Panel:  Recent Labs Lab 11/16/13 0049  NA 135*  K 3.8  CL 100  GLUCOSE 136*  BUN 8  CREATININE 0.90    Liver Function Tests: No results found for this basename: AST, ALT, ALKPHOS, BILITOT, PROT, ALBUMIN,  in the last 168 hours No results found for this basename: LIPASE, AMYLASE,  in the last 168 hours No results found for this basename: AMMONIA,  in the last 168  hours  CBC:  Recent Labs Lab 11/16/13 0042 11/16/13 0049  WBC 7.4  --   NEUTROABS 3.6  --   HGB 15.8 17.0  HCT 46.5 50.0  MCV 87.6  --   PLT 216  --     Cardiac Enzymes:  Recent Labs Lab 11/16/13 0042  TROPONINI <0.30    BNP: No components found with this basename: POCBNP,   CBG:  Recent Labs Lab 11/16/13 [place]*    Microbiology: No results found for this or any previous visit.  Coagulation Studies:  Recent Labs  11/16/13 0042  LABPROT 12.2  INR 0.92    Urinalysis: No results found for this basename: COLORURINE, APPERANCEUR, LABSPEC, PHURINE, GLUCOSEU, HGBUR, BILIRUBINUR, KETONESUR, PROTEINUR, UROBILINOGEN, NITRITE, LEUKOCYTESUR,  in the last 168 hours  Lipid Panel:  No results found for this basename: chol,  trig,  hdl,  cholhdl,  vldl,  ldlcalc    HgbA1C:  No results found for this basename: HGBA1C    Urine Drug Screen:   No results found for this basename: labopia,  cocainscrnur,  labbenz,  amphetmu,  thcu,  labbarb    Alcohol Level: No results found for this basename: ETH,  in the last 168 hours  Other results: EKG: normal sinus rhythm at 91 bpm.  Imaging: Ct Head Wo Contrast  11/16/2013   CLINICAL DATA:  Code stroke.  Facial droop.  EXAM: CT HEAD WITHOUT CONTRAST  TECHNIQUE: Contiguous axial images were obtained from the base of the skull through the vertex without intravenous contrast.  COMPARISON:  None.  FINDINGS: Diffuse cerebral atrophy. No ventricular dilatation. Calcification in the choroid plexus. Vague low-attenuation change in left internal capsule and centrum semiovale suggesting age indeterminate lacunar infarct. No mass effect or midline shift. No abnormal extra-axial fluid collections. Gray-white matter junctions are distinct. Basal cisterns are not effaced. No evidence of acute intracranial hemorrhage. No depressed skull fractures. Visualized paranasal sinuses and mastoid air cells are not opacified.  IMPRESSION: Focal  low-attenuation change in the deep white matter on the left suggesting age-indeterminate ischemic focus. No mass effect or midline shift. No acute intracranial hemorrhage. Diffuse cerebral atrophy.  Code stroke results were telephoned to Dr. Venora Maples at [facility] on 11/16/2013.   Electronically Signed   By: Lucienne Capers M.D.   On: 11/16/2013 00:38    Assessment: 68 y.o. male presenting with acute onset dysarthria and left sided weakness and numbness.  Patient has similar symptoms on 12/29 but symptoms resolved completely.  Head CT reviewed and no acute changes noted.  Contraindications to tPA reviewed.  Risks and benefits discussed with the patient and his wife.  Verbal consent obtained.  tPA was administered.     Stroke Risk Factors - hyperlipidemia and hypertension  Plan: 1. HgbA1c, fasting lipid panel 2. MRI, MRA  of the brain without contrast once cleared by Dr. Carollee Leitz placed in neck and unclear if MRI compatible 3. PT consult, OT consult, Speech consult 4. Echocardiogram 5. Carotid dopplers 6. Prophylactic therapy-None 7. Risk factor modification 8. Telemetry monitoring 9. Frequent neuro checks 10. Repeat head CT in 24 hours 11. Patient to be admitted to Neuro ICU  This patient is critically ill and at significant risk of neurological worsening, death and care requires constant monitoring of vital signs, hemodynamics,respiratory and cardiac monitoring, neurological assessment, discussion with family, other specialists and medical decision making of high complexity. I spent 60 minutes of neurocritical care time  in the care of  this patient.    [name], MD [facility] [phone] 11/16/2013, 1:20 AM

## 2013-11-16 NOTE — Progress Notes (Addendum)
Stroke Team Progress Note  HISTORY Joshua Horton is an 68 y.o. male who was at home with his wife this evening 11/15/2013. While speaking with his wife had acute onset difficulty with speech. Then noted weakness on his left side as well. EMS was called at that time and the patient was brought in as a code stroke. CBG 118. Initial NIHSS of 5. The patient reports that he had a similar episode on 11/12/2013. His symptoms resolved completely after about 30 minutes and the patient refused transport by EMS. Medical attention was not sought.  Patient was given TPA. He was admitted to the neuro ICU  for further evaluation and treatment.  SUBJECTIVE Resting comfortably. Had episode of dark brown/black vomit this morning. Vitals remain stable. Patient notes history of LE DVT, not on anticoagulation for this, family states he was scheduled to have this re-evaluated in Feb/March.   OBJECTIVE Most recent Vital Signs: Filed Vitals:   11/16/13 0515 11/16/13 0530 11/16/13 0545 11/16/13 0600  BP: 155/78 133/108 142/64 148/69  Pulse: 91 94 89 89  Temp:      TempSrc:      Resp: 21 19 20 15   Height:      Weight:      SpO2: 96% 97% 95% 97%   CBG (last 3)   Recent Labs  11/16/13 0039  GLUCAP 141*    IV Fluid Intake:   . sodium chloride 75 mL/hr at 11/16/13 0325    MEDICATIONS  . pantoprazole (PROTONIX) IV  40 mg Intravenous QHS   PRN:  acetaminophen, acetaminophen, labetalol, senna-docusate  Diet:  NPO  Activity:  Bedrest DVT Prophylaxis:  SCDs   CLINICALLY SIGNIFICANT STUDIES Basic Metabolic Panel:  Recent Labs Lab 11/16/13 0042 11/16/13 0049  NA 135* 135*  K 4.0 3.8  CL 96 100  CO2 25  --   GLUCOSE 135* 136*  BUN 9 8  CREATININE 0.83 0.90  CALCIUM 9.2  --    Liver Function Tests:  Recent Labs Lab 11/16/13 0042  AST 30  ALT 37  ALKPHOS 105  BILITOT 0.3  PROT 7.5  ALBUMIN 3.9   CBC:  Recent Labs Lab 11/16/13 0042 11/16/13 0049  WBC 7.4  --   NEUTROABS 3.6  --    HGB 15.8 17.0  HCT 46.5 50.0  MCV 87.6  --   PLT 216  --    Coagulation:  Recent Labs Lab 11/16/13 0042  LABPROT 12.2  INR 0.92   Cardiac Enzymes:  Recent Labs Lab 11/16/13 0042  TROPONINI <0.30   Urinalysis: No results found for this basename: COLORURINE, APPERANCEUR, LABSPEC, [place], GLUCOSEU, HGBUR, BILIRUBINUR, KETONESUR, PROTEINUR, UROBILINOGEN, NITRITE, LEUKOCYTESUR,  in the last 168 hours Lipid Panel    Component Value Date/Time   CHOL 169 11/16/2013 0402   TRIG 215* 11/16/2013 0402   HDL 32* 11/16/2013 0402   CHOLHDL 5.3 11/16/2013 0402   VLDL 43* 11/16/2013 0402   LDLCALC 94 11/16/2013 0402   HgbA1C  No results found for this basename: HGBA1C    Urine Drug Screen:   No results found for this basename: labopia, cocainscrnur, labbenz, amphetmu, thcu, labbarb    Alcohol Level:  Recent Labs Lab 11/16/13 0042  ETH <11    CT of the brain  11/16/2013    Focal low-attenuation change in the deep white matter on the left suggesting age-indeterminate ischemic focus. No mass effect or midline shift. No acute intracranial hemorrhage. Diffuse cerebral atrophy.    MRI of the brain  MRA of the brain    2D Echocardiogram    Carotid Doppler  Findings suggest 40-59% right internal carotid artery stenosis, 1-39% left internal carotid artery stenosis. Vertebral arteries are patent with antegrade flow.   CXR    EKG  normal sinus rhythm.   Therapy Recommendations   Physical Exam   General Examination:  HEENT- Normocephalic, no lesions, without obvious abnormality. Normal external eye and conjunctiva. Normal external nose, mucus membranes and septum. Normal pharynx.  Neck supple with no masses, nodes, nodules or enlargement.  Cardiovascular - S1, S2 normal  Lungs - chest clear, no wheezing, rales, normal symmetric air entry  Abdomen - soft, non-tender; bowel sounds normal; Extremities - mild edema in lower extremities bilaterally  Neurologic Examination:  Mental Status:   Alert, oriented, thought content appropriate. Speech fluent but markedly dysarthric. Able to follow 3 step commands without difficulty.  Cranial Nerves:  II: Visual fields grossly normal, pupils equal, round, reactive to light III,IV, VI: ptosis not present, extra-ocular motions intact bilaterally  V,VII: left facial droop, facial light touch sensation decreased on the left side of the face  VIII: hearing normal bilaterally  IX,X: gag reflex reduced  XI: bilateral shoulder shrug  XII: midline tongue extension  Motor:  Right : Upper extremity 5/5 Left: Upper extremity 2+/5  Lower extremity 5/5 Lower extremity 5-/5  Tone and bulk:normal tone throughout; no atrophy noted  Sensory: Pinprick and light touch decreased in the left upper extremity  Deep Tendon Reflexes: 1+ and symmetric throughout with absent AJ's bilaterally  Plantars:  Right: downgoing Left: upgoing  Cerebellar:  normal finger-to-nose on the right and normal heel-to-shin testing bilaterally. Finger to nose difficult on the left due to weakness  Gait: Unable to test  CV: pulses palpable throughout    ASSESSMENT [name] is a 68 y.o. male presenting with difficulty with speech, left sided weakness. S/P IV t-PA 11/16/2013 at 0058. Imaging pending. 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 Infarct secondary to unknown source. With history of recurrent symptoms would have concern over vessel stenosis. On aspirin 81 mg orally every day prior to admission. Now on no antithrombotics as within 24 h of tPA for secondary stroke prevention. Work up underway.  hypertension Hyperlipidemia, LDL 95, on zocor 40 mg daily PTA, now on no statin, goal LDL < 100  Hyperglycemia, HgbA1c pending  CAD - CABG w/ stent 2003  PVD  Carotid stenosis  History of DVT  Cigarette smoker  Hospital day # 0  TREATMENT/PLAN  Resume aspirin for secondary stroke prevention if post-tPA imaging negative for hemorrhage. 1st dose needs to be given after  0058 tomorrow and prior to 12 midnight. Consider plavix if patient able to swallow at that time. Head CT  VVS consult  MRI/A  Resume statin when able to swallow  PT/OT/SLT consult  Burnetta Sabin, MSN, RN, ANVP-BC, ANP-BC, GNP-BC [facility] Pager: [phone] 11/16/2013 7:04 AM  This patient is critically ill and at significant risk of neurological worsening, death and care requires constant monitoring of vital signs, hemodynamics,respiratory and cardiac monitoring, neurological assessment, other specialists and medical decision making of high complexity. A total of 40min was spent with this patient.    I have personally obtained a history, examined the patient, evaluated imaging results, and formulated the assessment and plan of care. I agree with the above.   Jim Like, DO Neurology-Stroke

## 2013-11-16 NOTE — Progress Notes (Signed)
UR completed.  Gopal Malter, RN BSN [facility] CCM Trauma/Neuro ICU Case Manager [phone]  

## 2013-11-16 NOTE — Progress Notes (Signed)
Updated pt on course of care for the night. Informed pt about head CT scheduled for 0500. Pt becomes anxious and states "I'm not going, it's loud, I can't breathe". Explained to pt CT is very different from an MRI and is very short and that this nurse will be with him the whole time, attempting to reassure pt. Wife states pt is very Claustrophobic. Pt states "I feel like I can't breathe now". Reassured pt.  Dr. Doy Mince made aware of situation prior to CT scan just in case problem arises. Will continue to monitor.

## 2013-11-16 NOTE — ED Notes (Signed)
Pt wife at bedside, reports Last seen normal 2215 and noticed symptoms at 2220.

## 2013-11-16 NOTE — Evaluation (Signed)
Clinical/Bedside Swallow Evaluation Patient Details  Name: Joshua Horton MRN: [id number] Date of Birth: July 21, 1946  Today's Date: 11/16/2013 Time: 9381-8299 SLP Time Calculation (min): 25 min  Past Medical History:  Past Medical History  Diagnosis Date  . Coronary artery disease   . Wheezing   . Chronic cough   . Peripheral vascular disease   . Hypertension   . Hyperlipidemia   . Reflux   . COPD (chronic obstructive pulmonary disease)    Past Surgical History:  Past Surgical History  Procedure Laterality Date  . Coronary angioplasty with stent placement  2003  . Cervical disc surgery      Dr Vertell Limber   HPI:  [name] is a 68 y.o. male presenting with difficulty with speech, left sided weakness. S/P IV t-PA 11/16/2013 at 0058. MRI shows Scattered acute right MCA infarcts, most confluent at the posterior insula; Superimposed right occipital lobe hemorrhagic infarction with focal hematoma encompassing 30 x 19 x 13 mm. Superimposed small volume of subarachnoid hemorrhage in the right hemisphere.   Assessment / Plan / Recommendation Clinical Impression  Pt demonstrates evidence concerning for a moderate to severe pharyngeal dysphagia though no signs of sensed aspiration observed. Pt has significant left lingual facial and labial weakness with severe oral residuals with puree. Would suspect based on presentation that pharyngeal residuals highly likely with thickened textures as well. Pt consumed sips of water without cough, but again high risk of silent aspiration. Would not recommend diet until objective testing complete. Due to body habitus, FEES may be necessary. IMPORTANT NOTE: Pt is very fearful of applesauce and cried during assessment when SLP attempted to give him applesauce. He reluctantly accepted pudding.     Aspiration Risk  Severe    Diet Recommendation NPO        Other  Recommendations Recommended Consults: FEES Oral Care Recommendations: Oral care Q4 per  protocol   Follow Up Recommendations  Inpatient Rehab    Frequency and Duration        Pertinent Vitals/Pain NA    SLP Swallow Goals     Swallow Study Prior Functional Status       General HPI: [name] is a 68 y.o. male presenting with difficulty with speech, left sided weakness. S/P IV t-PA 11/16/2013 at 0058. MRI shows Scattered acute right MCA infarcts, most confluent at the posterior insula; Superimposed right occipital lobe hemorrhagic infarction with focal hematoma encompassing 30 x 19 x 13 mm. Superimposed small volume of subarachnoid hemorrhage in the right hemisphere. Type of Study: Bedside swallow evaluation Previous Swallow Assessment: none Diet Prior to this Study: NPO Temperature Spikes Noted: No Respiratory Status: Room air History of Recent Intubation: No Behavior/Cognition: Alert;Cooperative;Pleasant mood Oral Cavity - Dentition: Adequate natural dentition Self-Feeding Abilities: Able to feed self Patient Positioning: Upright in bed Baseline Vocal Quality: Clear Volitional Cough: Strong Volitional Swallow: Able to elicit    Oral/Motor/Sensory Function Overall Oral Motor/Sensory Function: Impaired Labial ROM: Reduced left Labial Symmetry: Abnormal symmetry left Labial Strength: Reduced Labial Sensation: Reduced Lingual ROM: Reduced left Lingual Symmetry: Abnormal symmetry left Lingual Strength: Reduced Lingual Sensation: Reduced Facial ROM: Reduced left Facial Symmetry: Left droop Facial Strength: Reduced Facial Sensation: Reduced Velum: Impaired left Mandible: Within Functional Limits   Ice Chips Ice chips: Impaired Presentation: Spoon Oral Phase Impairments: Reduced labial seal;Reduced lingual movement/coordination;Impaired anterior to posterior transit Oral Phase Functional Implications: Right anterior spillage;Left lateral sulci pocketing;Prolonged oral transit Pharyngeal Phase Impairments: Decreased hyoid-laryngeal movement;Suspected  delayed  Swallow   Thin Liquid Thin Liquid: Impaired Presentation: Straw;Self Fed Oral Phase Impairments: Reduced labial seal;Reduced lingual movement/coordination;Impaired anterior to posterior transit Oral Phase Functional Implications: Oral residue;Prolonged oral transit Pharyngeal  Phase Impairments: Suspected delayed Swallow;Decreased hyoid-laryngeal movement    Nectar Thick Nectar Thick Liquid: Not tested   Honey Thick Honey Thick Liquid: Not tested   Puree Puree: Impaired Presentation: Spoon;Self Fed Oral Phase Impairments: Reduced labial seal;Reduced lingual movement/coordination;Impaired anterior to posterior transit Oral Phase Functional Implications: Oral residue;Left lateral sulci pocketing;Prolonged oral transit Pharyngeal Phase Impairments: Suspected delayed Swallow;Decreased hyoid-laryngeal movement   Solid   GO    Solid: Not tested      Herbie Baltimore, MA CCC-SLP [phone]  Lynann Beaver 11/16/2013,4:44 PM

## 2013-11-16 NOTE — ED Provider Notes (Signed)
CSN: [id number]     Arrival date & time 11/16/13  0026 History   First MD Initiated Contact with Patient 11/16/13 0035     Chief Complaint  Patient presents with  . Code Stroke    The history is provided by the patient. History limited by: Level V caveat: Dysarthria.   history recent TIA.  Now presenting with left-sided facial drooping and reported left-sided arm and leg weakness.  Speech noted to be slurred by EMS.  Last seen well was at 2330.  Brought to the emergency department as a code stroke.  Denies chest pain shortness of breath at this time.  Patient has a history of coronary artery disease, hypertension, GERD, COPD.  Past Medical History  Diagnosis Date  . Coronary artery disease   . Wheezing   . Chronic cough   . Peripheral vascular disease   . Hypertension   . Hyperlipidemia   . Reflux   . COPD (chronic obstructive pulmonary disease)    Past Surgical History  Procedure Laterality Date  . Coronary angioplasty with stent placement  2003  . Cervical disc surgery      Dr Vertell Limber   Family History  Problem Relation Age of Onset  . Heart disease    . Arthritis    . Cancer    . Kidney disease    . Cancer Mother   . Deep vein thrombosis Father   . Heart disease Father   . Hyperlipidemia Father   . Hypertension Father   . Heart attack Father   . Peripheral vascular disease Father    History  Substance Use Topics  . Smoking status: Former Smoker -- 1.00 packs/day    Types: Cigarettes    Quit date: 04/03/2013  . Smokeless tobacco: Never Used  . Alcohol Use: No    Review of Systems  Unable to perform ROS   Allergies  Ivp dye  Home Medications   Current Outpatient Rx  Name  Route  Sig  Dispense  Refill  . aspirin 81 MG tablet   Oral   Take 81 mg by mouth daily.         Marland Kitchen lisinopril-hydrochlorothiazide (PRINZIDE,ZESTORETIC) 10-12.5 MG per tablet   Oral   Take 1 tablet by mouth daily.         Marland Kitchen NITROSTAT 0.4 MG SL tablet   Oral   Take by mouth as  needed.         Marland Kitchen omeprazole (PRILOSEC) 20 MG capsule   Oral   Take 20 mg by mouth daily.         . simvastatin (ZOCOR) 40 MG tablet   Oral   Take 40 mg by mouth every evening.          BP 153/74  Pulse 92  Resp 23  Ht 5\' 10"  (1.778 m)  Wt 212 lb 1.3 oz (96.2 kg)  BMI 30.43 kg/m2  SpO2 96% Physical Exam  Nursing note and vitals reviewed. Constitutional: He is oriented to person, place, and time. He appears well-developed and well-nourished.  HENT:  Head: Normocephalic and atraumatic.  Eyes: EOM are normal.  Neck: Normal range of motion.  Cardiovascular: Normal rate, regular rhythm, normal heart sounds and intact distal pulses.   Pulmonary/Chest: Effort normal and breath sounds normal. No respiratory distress.  Abdominal: Soft. He exhibits no distension. There is no tenderness.  Musculoskeletal: Normal range of motion.  Neurological: He is alert and oriented to person, place, and time.  Out of 5  strength in bilateral upper lower extremity major muscle groups.  Left-sided facial droop.  Dysarthric speech.  Skin: Skin is warm and dry.  Psychiatric: He has a normal mood and affect. Judgment normal.    ED Course  Procedures (including critical care time) CRITICAL CARE Performed by: Hoy Morn Total critical care time: 30 Critical care time was exclusive of separately billable procedures and treating other patients. Critical care was necessary to treat or prevent imminent or life-threatening deterioration. Critical care was time spent personally by me on the following activities: development of treatment plan with patient and/or surrogate as well as nursing, discussions with consultants, evaluation of patient's response to treatment, examination of patient, obtaining history from patient or surrogate, ordering and performing treatments and interventions, ordering and review of laboratory studies, ordering and review of radiographic studies, pulse oximetry and  re-evaluation of patient's condition.   Labs Review Labs Reviewed  GLUCOSE, CAPILLARY - Abnormal; Notable for the following:    Glucose-Capillary 141 (*)    All other components within normal limits  POCT I-STAT, CHEM 8 - Abnormal; Notable for the following:    Sodium 135 (*)    Glucose, Bld 136 (*)    All other components within normal limits  PROTIME-INR  APTT  CBC  DIFFERENTIAL  ETHANOL  COMPREHENSIVE METABOLIC PANEL  TROPONIN I  URINE RAPID DRUG SCREEN (HOSP PERFORMED)  URINALYSIS, ROUTINE W REFLEX MICROSCOPIC  POCT I-STAT TROPONIN I   Imaging Review Ct Head Wo Contrast  11/16/2013   CLINICAL DATA:  Code stroke.  Facial droop.  EXAM: CT HEAD WITHOUT CONTRAST  TECHNIQUE: Contiguous axial images were obtained from the base of the skull through the vertex without intravenous contrast.  COMPARISON:  None.  FINDINGS: Diffuse cerebral atrophy. No ventricular dilatation. Calcification in the choroid plexus. Vague low-attenuation change in left internal capsule and centrum semiovale suggesting age indeterminate lacunar infarct. No mass effect or midline shift. No abnormal extra-axial fluid collections. Gray-white matter junctions are distinct. Basal cisterns are not effaced. No evidence of acute intracranial hemorrhage. No depressed skull fractures. Visualized paranasal sinuses and mastoid air cells are not opacified.  IMPRESSION: Focal low-attenuation change in the deep white matter on the left suggesting age-indeterminate ischemic focus. No mass effect or midline shift. No acute intracranial hemorrhage. Diffuse cerebral atrophy.  Code stroke results were telephoned to Dr. Venora Maples at [facility] on 11/16/2013.   Electronically Signed   By: Lucienne Capers M.D.   On: 11/16/2013 00:38  I personally reviewed the imaging tests through PACS system I reviewed available ER/hospitalization records through the EMR   EKG Interpretation    Date/Time:  Friday November 16 2013 01:13:26 EST Ventricular  Rate:  91 PR Interval:  189 QRS Duration: 71 QT Interval:  333 QTC Calculation: 410 R Axis:   75 Text Interpretation:  Sinus rhythm No significant change was found Confirmed by Kensli Bowley  MD, Marshawn Ninneman ([id number]) on 11/16/2013 9:11:27 AM            MDM   1. Stroke    Pt seen by Dr Doy Mince, stroke MD, will be giving TPA at this time given severe dysarthria. Admit to neurology    Hoy Morn, MD 11/16/13 [phone]

## 2013-11-16 NOTE — Progress Notes (Signed)
  Echocardiogram 2D Echocardiogram has been performed.  Mauricio Po 11/16/2013, 3:42 PM

## 2013-11-16 NOTE — ED Notes (Signed)
TPA at bedside  °

## 2013-11-16 NOTE — Progress Notes (Addendum)
*  PRELIMINARY RESULTS* Vascular Ultrasound Carotid Duplex (Doppler) has been completed.   Findings suggest 40-59% right internal carotid artery stenosis, 1-39% left internal carotid artery stenosis. Unable to visualize bilateral vertebral arteries.  11/16/2013 10:00 AM Maudry Mayhew, RVT, RDCS, RDMS

## 2013-11-16 NOTE — ED Notes (Signed)
PER EMS: hx of TIA on 12/29. Pt now presents with left sided facial drooping, left side arm and leg weakness, slurred speech. Last known well 12/1 at 2330. BP-208/84, HR-107.

## 2013-11-16 NOTE — ED Notes (Signed)
EKG given to Dr. Doy Mince

## 2013-11-16 NOTE — Progress Notes (Signed)
SLP Cancellation Note  Patient Details Name: Joshua Horton MRN: [id number] DOB: [birth year]   Cancelled treatment:       Reason Eval/Treat Not Completed: Medical issues which prohibited therapy;Patient at procedure or test/unavailable. Pt not in room, but also vomiting today. Will plan to f/u tomorrow for swallow eval.    Zooey Schreurs, Katherene Ponto 11/16/2013, 11:57 AM

## 2013-11-16 NOTE — Progress Notes (Signed)
Pt had episode of dark brown/black vomit; vitals stable BP 176/73, O2 95 RA, HR 99. Pt A&O X4, MAE within baseline. Dr Janann Colonel made aware. Will continue to monitor pt.  Anda Kraft Velva

## 2013-11-17 ENCOUNTER — Inpatient Hospital Stay (HOSPITAL_COMMUNITY): Payer: Medicare Other

## 2013-11-17 NOTE — Progress Notes (Addendum)
Stroke Team Progress Note  HISTORY Joshua Horton is an 68 y.o. male who was at home with his wife this evening 11/15/2013. While speaking with his wife had acute onset difficulty with speech. Then noted weakness on his left side as well. EMS was called at that time and the patient was brought in as a code stroke. CBG 118. Initial NIHSS of 5. The patient reports that he had a similar episode on 11/12/2013. His symptoms resolved completely after about 30 minutes and the patient refused transport by EMS. Medical attention was not sought.  Patient was given TPA. He was admitted to the neuro ICU  for further evaluation and treatment.  SUBJECTIVE Resting comfortably. He states he is ready to go home. Repeat head CT overnight. Currently NPO, SLT planning a MBS  OBJECTIVE Most recent Vital Signs: Filed Vitals:   11/17/13 0600 11/17/13 0700 11/17/13 0742 11/17/13 0800  BP: 133/72  151/72 147/64  Pulse: 85 95 94 86  Temp:      TempSrc:      Resp: 18 13 20 19   Height:      Weight:      SpO2: 93% 97% 91% 93%   CBG (last 3)   Recent Labs  11/16/13 0039  GLUCAP 141*    IV Fluid Intake:   . sodium chloride 75 mL/hr at 11/16/13 1529    MEDICATIONS  . pantoprazole (PROTONIX) IV  40 mg Intravenous QHS   PRN:  acetaminophen, acetaminophen, labetalol, senna-docusate  Diet:  NPO  Activity:  Bedrest DVT Prophylaxis:  SCDs   CLINICALLY SIGNIFICANT STUDIES Basic Metabolic Panel:   Recent Labs Lab 11/16/13 0042 11/16/13 0049  NA 135* 135*  K 4.0 3.8  CL 96 100  CO2 25  --   GLUCOSE 135* 136*  BUN 9 8  CREATININE 0.83 0.90  CALCIUM 9.2  --    Liver Function Tests:   Recent Labs Lab 11/16/13 0042  AST 30  ALT 37  ALKPHOS 105  BILITOT 0.3  PROT 7.5  ALBUMIN 3.9   CBC:   Recent Labs Lab 11/16/13 0042 11/16/13 0049  WBC 7.4  --   NEUTROABS 3.6  --   HGB 15.8 17.0  HCT 46.5 50.0  MCV 87.6  --   PLT 216  --    Coagulation:   Recent Labs Lab 11/16/13 0042   LABPROT 12.2  INR 0.92   Cardiac Enzymes:   Recent Labs Lab 11/16/13 0042  TROPONINI <0.30   Urinalysis: No results found for this basename: COLORURINE, APPERANCEUR, LABSPEC, Stanley, GLUCOSEU, HGBUR, BILIRUBINUR, KETONESUR, PROTEINUR, UROBILINOGEN, NITRITE, LEUKOCYTESUR,  in the last 168 hours Lipid Panel    Component Value Date/Time   CHOL 169 11/16/2013 0402   TRIG 215* 11/16/2013 0402   HDL 32* 11/16/2013 0402   CHOLHDL 5.3 11/16/2013 0402   VLDL 43* 11/16/2013 0402   LDLCALC 94 11/16/2013 0402   HgbA1C  Lab Results  Component Value Date   HGBA1C 6.5* 11/16/2013    Urine Drug Screen:   No results found for this basename: labopia,  cocainscrnur,  labbenz,  amphetmu,  thcu,  labbarb    Alcohol Level:   Recent Labs Lab 11/16/13 0042  ETH <11    CT of the brain  11/16/2013    Focal low-attenuation change in the deep white matter on the left suggesting age-indeterminate ischemic focus. No mass effect or midline shift. No acute intracranial hemorrhage. Diffuse cerebral atrophy.    11/17/2013: 1. Similar size and  distribution of scattered right MCA territory  infarcts, most confluent within the right operculum.  2. Stable size of right occipital lobe hemorrhagic infarction with  hematoma, measuring approximately 3.0 x 1.9 cm.  3. Subarachnoid hemorrhage within the posterior right temporal and  right occipital lobe, similar to prior. Trace subarachnoid now  visible within the right frontal and left frontal lobes, which may  be related to redistribution. No intraventricular or definite new  intracranial hemorrhage.  4. No significant mass effect, midline shift, or hydrocephalus at  this time. No new infarct.  MRI of the brain   1. Scattered acute right MCA infarcts, most confluent at the  posterior insula.  2. Superimposed right occipital lobe hemorrhagic infarction with  focal hematoma encompassing 30 x 19 x 13 mm.  3. Superimposed small volume of subarachnoid hemorrhage in the  right  hemisphere.  4. No intraventricular hemorrhage, ventriculomegaly, or significant  mass effect at this time.  5. Right MCA M2 segment occlusions/stenoses. Middle and posterior  sylvian branches most affected. Right MCA M1 segment remains patent.  6. Suspect plaque or thrombus in the supraclinoid right ICA causing  mild to moderate stenosis.  MRA of the brain    2D Echocardiogram   Normal LV size and vigorous systolic function, EF 43-32%. Normal RV size and systolic function. No significant valvular abnormalities.   Carotid Doppler  Findings suggest 40-59% right internal carotid artery stenosis, 1-39% left internal carotid artery stenosis. Vertebral arteries are patent with antegrade flow.   CXR    EKG  normal sinus rhythm.   Therapy Recommendations   Physical Exam   General Examination:  HEENT- Normocephalic, no lesions, without obvious abnormality. Normal external eye and conjunctiva. Normal external nose, mucus membranes and septum. Normal pharynx.  Neck supple with no masses, nodes, nodules or enlargement.  Cardiovascular - S1, S2 normal  Lungs - chest clear, no wheezing, rales, normal symmetric air entry  Abdomen - soft, non-tender; bowel sounds normal; Extremities - mild edema in lower extremities bilaterally  Neurologic Examination:  Mental Status:  Alert, oriented, thought content appropriate. Speech fluent but markedly dysarthric. Able to follow 3 step commands without difficulty.  Cranial Nerves:  II: Visual fields grossly normal, pupils equal, round, reactive to light III,IV, VI: ptosis not present, extra-ocular motions intact bilaterally  V,VII: left facial droop, facial light touch sensation decreased on the left side of the face  VIII: hearing normal bilaterally  IX,X: gag reflex reduced  XI: bilateral shoulder shrug  XII: midline tongue extension  Motor:  Right : Upper extremity 5/5 Left: Upper extremity 2+/5  Lower extremity 5/5 Lower extremity 5-/5   Tone and bulk:normal tone throughout; no atrophy noted  Sensory: Pinprick and light touch decreased in the left upper extremity  Deep Tendon Reflexes: 1+ and symmetric throughout with absent AJ's bilaterally  Plantars:  Right: downgoing Left: upgoing  Cerebellar:  normal finger-to-nose on the right and normal heel-to-shin testing bilaterally. Finger to nose difficult on the left due to weakness  Gait: Unable to test  CV: pulses palpable throughout    ASSESSMENT Mr. GARRELL FLAGG is a 68 y.o. male presenting with difficulty with speech, left sided weakness. S/P IV t-PA 11/16/2013 at 0058. Imaging pending. 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 Infarct secondary to unknown source. With history of recurrent symptoms would have concern over vessel stenosis. On aspirin 81 mg orally every day prior to admission. Now on no antithrombotic for secondary stroke prevention. Work up underway.  hypertension Hyperlipidemia, LDL  95, on zocor 40 mg daily PTA, now on no statin, goal LDL < 100  Hyperglycemia, HgbA1c pending  CAD - CABG w/ stent 2003  PVD  Carotid stenosis  History of DVT  Cigarette smoker  Hospital day # 1  TREATMENT/PLAN  Holding antiplatelet therapy due to ICH/SAH on repeat head CT. Will plan for repeat head CT AM 1/04. ICH appears clinically stable, no neurosurgical intervention at this time. Will continue to monitor  Consider VVS consult as outpatient  Resume statin when able to swallow  PT/OT/SLT consult  Pending MBS study  If stable plan transfer out of ICU tomorrow   This patient is critically ill and at significant risk of neurological worsening, death and care requires constant monitoring of vital signs, hemodynamics,respiratory and cardiac monitoring, neurological assessment, other specialists and medical decision making of high complexity.  A total of 9min was spent with this patient.   Jim Like, DO Neurology-Stroke

## 2013-11-17 NOTE — Progress Notes (Signed)
PT Cancellation Note  Patient Details Name: Joshua Horton MRN: [id number] DOB: March 05, 1946   Cancelled Treatment:    Reason Eval/Treat Not Completed: Medical issues which prohibited therapy; Patient on bedrest per MD note and order.  Please increase activity level as appropriate.  PT will evaluate once activity allowed.   Paged MD to inform of bedrest orders limiting PT eval. Thank you,   Shanna Cisco 11/17/2013, 2:53 PM

## 2013-11-17 NOTE — Procedures (Signed)
Objective Swallowing Evaluation: Modified Barium Swallowing Study  Patient Details  Name: Joshua Horton MRN: [id number] Date of Birth: 02/18/1946  Today's Date: 11/17/2013 Time: 1015-1035 SLP Time Calculation (min): 20 min  Past Medical History:  Past Medical History  Diagnosis Date  . Coronary artery disease   . Wheezing   . Chronic cough   . Peripheral vascular disease   . Hypertension   . Hyperlipidemia   . Reflux   . COPD (chronic obstructive pulmonary disease)    Past Surgical History:  Past Surgical History  Procedure Laterality Date  . Coronary angioplasty with stent placement  2003  . Cervical disc surgery      Dr Vertell Limber   HPI:  Joshua Horton is a 68 y.o. male presenting with difficulty with speech, left sided weakness. S/P IV t-PA 11/16/2013 at 0058. MRI shows Scattered acute right MCA infarcts, most confluent at the posterior insula; Superimposed right occipital lobe hemorrhagic infarction with focal hematoma encompassing 30 x 19 x 13 mm. Superimposed small volume of subarachnoid hemorrhage in the right hemisphere.     Assessment / Plan / Recommendation Clinical Impression  Dysphagia Diagnosis: Moderate oral phase dysphagia;Moderate pharyngeal phase dysphagia;Moderate cervical esophageal phase dysphagia Clinical impression: Patient presents with a multi-factorial dysphagia s/p acute CVA with oral, pharyngeal and esophageal components. Patient with left sided oral weakness resulting in delayed oral transit, decreased bolus cohesion, oral residuals, anterior spillage. An eventual delay in swallow initiation resulted in one episode of flash penetration with thickened liquids. Otherwise, patient able to protect airway. Largest aspiration risk at this time is moderate pharyngeal residuals post swallow due to combination of base of tongue, laryngeal weakness and decreased UES relaxation. SLP provided moderate-max cues for dry swallows which were effective to reduce residue,  reducing aspiration risk. Recommend initiation of dysphagia 1 diet, thin liquids with strict aspiration precautions. Patient and wife present for education regarding above. Will f/u.     Treatment Recommendation  Therapy as outlined in treatment plan below    Diet Recommendation Dysphagia 1 (Puree);Thin liquid   Liquid Administration via: Cup;No straw Medication Administration: Crushed with puree Supervision: Patient able to self feed;Full supervision/cueing for compensatory strategies Compensations: Slow rate;Small sips/bites;Check for pocketing;Multiple dry swallows after each bite/sip Postural Changes and/or Swallow Maneuvers: Seated upright 90 degrees    Other  Recommendations Oral Care Recommendations: Oral care before and after PO   Follow Up Recommendations  Inpatient Rehab    Frequency and Duration min 3x week  2 weeks           General HPI: Joshua Horton is a 68 y.o. male presenting with difficulty with speech, left sided weakness. S/P IV t-PA 11/16/2013 at 0058. MRI shows Scattered acute right MCA infarcts, most confluent at the posterior insula; Superimposed right occipital lobe hemorrhagic infarction with focal hematoma encompassing 30 x 19 x 13 mm. Superimposed small volume of subarachnoid hemorrhage in the right hemisphere. Type of Study: Modified Barium Swallowing Study Reason for Referral: Objectively evaluate swallowing function Diet Prior to this Study: NPO Temperature Spikes Noted: No Respiratory Status: Room air History of Recent Intubation: No Behavior/Cognition: Cooperative;Alert;Pleasant mood Oral Cavity - Dentition: Poor condition;Missing dentition Oral Motor / Sensory Function: Impaired - see Bedside swallow eval Self-Feeding Abilities: Able to feed self;Needs assist Patient Positioning: Upright in chair Baseline Vocal Quality: Clear Volitional Cough: Strong Volitional Swallow: Able to elicit Anatomy: Within functional limits Pharyngeal  Secretions: Not observed secondary MBS    Reason for Referral  Objectively evaluate swallowing function   Oral Phase Oral Preparation/Oral Phase Oral Phase: Impaired Oral - Nectar Oral - Nectar Teaspoon: Left anterior bolus loss;Weak lingual manipulation;Reduced posterior propulsion;Left pocketing in lateral sulci;Lingual/palatal residue;Delayed oral transit Oral - Nectar Cup: Left anterior bolus loss;Weak lingual manipulation;Reduced posterior propulsion;Left pocketing in lateral sulci;Lingual/palatal residue;Delayed oral transit Oral - Thin Oral - Thin Cup: Left anterior bolus loss;Weak lingual manipulation;Reduced posterior propulsion;Left pocketing in lateral sulci;Lingual/palatal residue;Delayed oral transit Oral - Thin Straw: Left anterior bolus loss;Weak lingual manipulation;Reduced posterior propulsion;Left pocketing in lateral sulci;Lingual/palatal residue;Delayed oral transit (difficult drawing liquids up from straw) Oral - Solids Oral - Puree: Left anterior bolus loss;Weak lingual manipulation;Reduced posterior propulsion;Left pocketing in lateral sulci;Lingual/palatal residue;Delayed oral transit Oral - Mechanical Soft: Weak lingual manipulation;Reduced posterior propulsion;Left pocketing in lateral sulci;Lingual/palatal residue;Delayed oral transit   Pharyngeal Phase Pharyngeal Phase Pharyngeal Phase: Impaired Pharyngeal - Nectar Pharyngeal - Nectar Teaspoon: Delayed swallow initiation;Premature spillage to valleculae;Reduced anterior laryngeal mobility;Reduced laryngeal elevation;Reduced tongue base retraction;Penetration/Aspiration before swallow;Pharyngeal residue - valleculae;Pharyngeal residue - pyriform sinuses;Pharyngeal residue - cp segment Penetration/Aspiration details (nectar teaspoon): Material enters airway, remains ABOVE vocal cords then ejected out Pharyngeal - Nectar Cup: Delayed swallow initiation;Premature spillage to valleculae;Reduced anterior laryngeal  mobility;Reduced laryngeal elevation;Reduced tongue base retraction;Pharyngeal residue - valleculae;Pharyngeal residue - pyriform sinuses;Pharyngeal residue - cp segment Penetration/Aspiration details (nectar cup): Material does not enter airway Pharyngeal - Thin Pharyngeal - Thin Cup: Delayed swallow initiation;Premature spillage to valleculae;Reduced anterior laryngeal mobility;Reduced laryngeal elevation;Reduced tongue base retraction;Pharyngeal residue - valleculae;Pharyngeal residue - pyriform sinuses;Pharyngeal residue - cp segment Pharyngeal - Thin Straw: Delayed swallow initiation;Premature spillage to valleculae;Reduced anterior laryngeal mobility;Reduced laryngeal elevation;Reduced tongue base retraction;Pharyngeal residue - valleculae;Pharyngeal residue - pyriform sinuses;Pharyngeal residue - cp segment Pharyngeal - Solids Pharyngeal - Puree: Delayed swallow initiation;Premature spillage to valleculae;Reduced anterior laryngeal mobility;Reduced laryngeal elevation;Reduced tongue base retraction;Pharyngeal residue - valleculae;Pharyngeal residue - pyriform sinuses;Pharyngeal residue - cp segment Pharyngeal - Mechanical Soft: Delayed swallow initiation;Premature spillage to valleculae;Reduced anterior laryngeal mobility;Reduced laryngeal elevation;Reduced tongue base retraction;Pharyngeal residue - valleculae;Pharyngeal residue - pyriform sinuses;Pharyngeal residue - cp segment  Cervical Esophageal Phase    GO    Cervical Esophageal Phase Cervical Esophageal Phase: Impaired Cervical Esophageal Phase - Nectar Nectar Teaspoon: Reduced cricopharyngeal relaxation Nectar Cup: Reduced cricopharyngeal relaxation Cervical Esophageal Phase - Thin Thin Cup: Reduced cricopharyngeal relaxation Thin Straw: Reduced cricopharyngeal relaxation Cervical Esophageal Phase - Solids Puree: Reduced cricopharyngeal relaxation Mechanical Soft: Reduced cricopharyngeal relaxation Cervical Esophageal Phase -  Comment Cervical Esophageal Comment: ? due to combination of decreased anterior hyo-laryngeal movement and presence of cervical hardware at C6-7        Gabriel Rainwater MA, CCC-SLP [phone]  Burnell Matlin Meryl 11/17/2013, 10:50 AM

## 2013-11-17 NOTE — Progress Notes (Signed)
Speech Language Pathology Treatment: Dysphagia  Patient Details Name: Joshua Horton MRN: [id number] DOB: August 05, 1946 Today's Date: 11/17/2013 Time: 5681-2751 SLP Time Calculation (min): 22 min  Assessment / Plan / Recommendation Clinical Impression  Diagnostic treatment focused on readiness for objective testing. Patient alert, max cueing for cooperation today. Refusing applesauce but agreeable to clinician provided trials of pudding thick orange juice. Able to self feed with max cues for appropriate bite/sip size. Oral phase remains delayed and with left anterior spillage. Suspect a delay in swallow initiation but without overt indication of aspiration. Agree with evaluating therapist that risk of silent aspiration high based on severity of deficits. Doubtful that patient will tolerate FEES. Although body habitus large, feel that MBS will be possible. Will proceed with MBS this am.    HPI HPI: [name] is a 68 y.o. male presenting with difficulty with speech, left sided weakness. S/P IV t-PA 11/16/2013 at 0058. MRI shows Scattered acute right MCA infarcts, most confluent at the posterior insula; Superimposed right occipital lobe hemorrhagic infarction with focal hematoma encompassing 30 x 19 x 13 mm. Superimposed small volume of subarachnoid hemorrhage in the right hemisphere.      SLP Plan  MBS    Recommendations Diet recommendations: NPO Medication Administration: Via alternative means              Oral Care Recommendations: Oral care Q4 per protocol Follow up Recommendations: Inpatient Rehab Plan: [facility] [place], [place] [phone]   Allyah Heather Meryl 11/17/2013, 9:20 AM

## 2013-11-18 ENCOUNTER — Inpatient Hospital Stay (HOSPITAL_COMMUNITY): Payer: Medicare Other

## 2013-11-18 LAB — BASIC METABOLIC PANEL
BUN: 13 mg/dL (ref 6–23)
CO2: 26 mEq/L (ref 19–32)
Calcium: 9.1 mg/dL (ref 8.4–10.5)
Chloride: 103 mEq/L (ref 96–112)
Creatinine, Ser: 0.87 mg/dL (ref 0.50–1.35)
GFR calc Af Amer: >90 mL/min (ref >90)
GFR, EST NON AFRICAN AMERICAN: 87 mL/min — AB (ref >90)
GLUCOSE: 96 mg/dL (ref 70–99)
Potassium: 3.8 mEq/L (ref 3.7–5.3)
Sodium: 140 mEq/L (ref 137–147)

## 2013-11-18 LAB — CBC WITH DIFFERENTIAL/PLATELET
Basophils Absolute: 0 10*3/uL (ref 0.0–0.1)
Basophils Relative: 1 % (ref 0–1)
EOS ABS: 0.2 10*3/uL (ref 0.0–0.7)
Eosinophils Relative: 2 % (ref 0–5)
HCT: 45.9 % (ref 39.0–52.0)
HEMOGLOBIN: 15.3 g/dL (ref 13.0–17.0)
Lymphocytes Relative: 31 % (ref 12–46)
Lymphs Abs: 2 10*3/uL (ref 0.7–4.0)
MCH: 29.5 pg (ref 26.0–34.0)
MCHC: 33.3 g/dL (ref 30.0–36.0)
MCV: 88.4 fL (ref 78.0–100.0)
MONOS PCT: 11 % (ref 3–12)
Monocytes Absolute: 0.7 10*3/uL (ref 0.1–1.0)
NEUTROS PCT: 55 % (ref 43–77)
Neutro Abs: 3.5 10*3/uL (ref 1.7–7.7)
Platelets: 211 10*3/uL (ref 150–400)
RBC: 5.19 MIL/uL (ref 4.22–5.81)
RDW: 13.9 % (ref 11.5–15.5)
WBC: 6.4 10*3/uL (ref 4.0–10.5)

## 2013-11-18 MED ORDER — AMLODIPINE BESYLATE 5 MG PO TABS
5.0000 mg | ORAL_TABLET | Freq: Every day | ORAL | Status: DC
  Administered 2013-11-18 – 2013-11-21 (×4): 5 mg via ORAL
  Filled 2013-11-18 (×5): qty 1

## 2013-11-18 NOTE — Plan of Care (Signed)
Problem: Progression Outcomes Goal: Tolerating diet/TF at goal rate Outcome: Progressing Pt eating small amounts at meals

## 2013-11-18 NOTE — Progress Notes (Addendum)
Stroke Team Progress Note  HISTORY Joshua Horton is an 68 y.o. male who was at home with his wife this evening 11/15/2013. While speaking with his wife had acute onset difficulty with speech. Then noted weakness on his left side as well. EMS was called at that time and the patient was brought in as a code stroke. CBG 118. Initial NIHSS of 5. The patient reports that he had a similar episode on 11/12/2013. His symptoms resolved completely after about 30 minutes and the patient refused transport by EMS. Medical attention was not sought.  Patient was given TPA. He was admitted to the neuro ICU  for further evaluation and treatment.  SUBJECTIVE MBS on 1/03, started on Dysphagia 1 diet. Resting comfortably, no acute complaints. Per RN, no overnight events. BP has remained stable. Repeat head CT stable with slight improvement in hematoma size noted  OBJECTIVE Most recent Vital Signs: Filed Vitals:   11/18/13 0440 11/18/13 0500 11/18/13 0600 11/18/13 0700  BP:  158/70 157/71 148/53  Pulse:   87 71  Temp: 97.9 F (36.6 C)     TempSrc: Oral     Resp:  19 18 18   Height:      Weight:  223 lb 1.7 oz (101.2 kg)    SpO2:   94% 94%   CBG (last 3)   Recent Labs  11/16/13 0039  GLUCAP 141*    IV Fluid Intake:   . sodium chloride 75 mL/hr at 11/17/13 1817    MEDICATIONS  . pantoprazole (PROTONIX) IV  40 mg Intravenous QHS   PRN:  acetaminophen, acetaminophen, labetalol, senna-docusate  Diet:  Dysphagia  Activity:  Bedrest DVT Prophylaxis:  SCDs   CLINICALLY SIGNIFICANT STUDIES Basic Metabolic Panel:   Recent Labs Lab 11/16/13 0042 11/16/13 0049 11/18/13 0350  NA 135* 135* 140  K 4.0 3.8 3.8  CL 96 100 103  CO2 25  --  26  GLUCOSE 135* 136* 96  BUN 9 8 13   CREATININE 0.83 0.90 0.87  CALCIUM 9.2  --  9.1   Liver Function Tests:   Recent Labs Lab 11/16/13 0042  AST 30  ALT 37  ALKPHOS 105  BILITOT 0.3  PROT 7.5  ALBUMIN 3.9   CBC:   Recent Labs Lab 11/16/13 0042  11/16/13 0049 11/18/13 0350  WBC 7.4  --  6.4  NEUTROABS 3.6  --  3.5  HGB 15.8 17.0 15.3  HCT 46.5 50.0 45.9  MCV 87.6  --  88.4  PLT 216  --  211   Coagulation:   Recent Labs Lab 11/16/13 0042  LABPROT 12.2  INR 0.92   Cardiac Enzymes:   Recent Labs Lab 11/16/13 0042  TROPONINI <0.30   Urinalysis: No results found for this basename: COLORURINE, APPERANCEUR, LABSPEC, [place], GLUCOSEU, HGBUR, BILIRUBINUR, KETONESUR, PROTEINUR, UROBILINOGEN, NITRITE, LEUKOCYTESUR,  in the last 168 hours Lipid Panel    Component Value Date/Time   CHOL 169 11/16/2013 0402   TRIG 215* 11/16/2013 0402   HDL 32* 11/16/2013 0402   CHOLHDL 5.3 11/16/2013 0402   VLDL 43* 11/16/2013 0402   LDLCALC 94 11/16/2013 0402   HgbA1C  Lab Results  Component Value Date   HGBA1C 6.5* 11/16/2013    Urine Drug Screen:   No results found for this basename: labopia,  cocainscrnur,  labbenz,  amphetmu,  thcu,  labbarb    Alcohol Level:   Recent Labs Lab 11/16/13 Central <11    CT of the brain  11/16/2013  Focal low-attenuation change in the deep white matter on the left suggesting age-indeterminate ischemic focus. No mass effect or midline shift. No acute intracranial hemorrhage. Diffuse cerebral atrophy.    11/17/2013: 1. Similar size and distribution of scattered right MCA territory  infarcts, most confluent within the right operculum.  2. Stable size of right occipital lobe hemorrhagic infarction with  hematoma, measuring approximately 3.0 x 1.9 cm.  3. Subarachnoid hemorrhage within the posterior right temporal and  right occipital lobe, similar to prior. Trace subarachnoid now  visible within the right frontal and left frontal lobes, which may  be related to redistribution. No intraventricular or definite new  intracranial hemorrhage.  4. No significant mass effect, midline shift, or hydrocephalus at  this time. No new infarct.  11/18/2013: 1. Normal expected interval evolution of scattered right  MCA  territory infarcts, most confluent within the operculum.  2. Interval decrease in size of right occipital hematoma, now  measuring approximately 2.5 x 1.8 cm, previously 3.0 x 1.9 cm.  Scattered subarachnoid hemorrhage is also decreased in conspicuity,  likely related to redistribution.  3. No new intracranial hemorrhage or other process identified.  4. No midline shift or hydrocephalus.   MRI of the brain   1. Scattered acute right MCA infarcts, most confluent at the  posterior insula.  2. Superimposed right occipital lobe hemorrhagic infarction with  focal hematoma encompassing 30 x 19 x 13 mm.  3. Superimposed small volume of subarachnoid hemorrhage in the right  hemisphere.  4. No intraventricular hemorrhage, ventriculomegaly, or significant  mass effect at this time.  5. Right MCA M2 segment occlusions/stenoses. Middle and posterior  sylvian branches most affected. Right MCA M1 segment remains patent.  6. Suspect plaque or thrombus in the supraclinoid right ICA causing  mild to moderate stenosis.  MRA of the brain    2D Echocardiogram   Normal LV size and vigorous systolic function, EF 34-19%. Normal RV size and systolic function. No significant valvular abnormalities.   Carotid Doppler  Findings suggest 40-59% right internal carotid artery stenosis, 1-39% left internal carotid artery stenosis. Vertebral arteries are patent with antegrade flow.   CXR    EKG  normal sinus rhythm.   Therapy Recommendations   Physical Exam   General Examination:  HEENT- Normocephalic, no lesions, without obvious abnormality. Normal external eye and conjunctiva. Normal external nose, mucus membranes and septum. Normal pharynx.  Neck supple with no masses, nodes, nodules or enlargement.  Cardiovascular - S1, S2 normal  Lungs - chest clear, no wheezing, rales, normal symmetric air entry  Abdomen - soft, non-tender; bowel sounds normal; Extremities - mild edema in lower extremities  bilaterally  Neurologic Examination:  Mental Status:  Alert, oriented, thought content appropriate. Speech fluent but markedly dysarthric. Able to follow 3 step commands without difficulty.  Cranial Nerves:  II: Visual fields grossly normal, pupils equal, round, reactive to light III,IV, VI: ptosis not present, extra-ocular motions intact bilaterally  V,VII: left facial droop, facial light touch sensation decreased on the left side of the face  VIII: hearing normal bilaterally  IX,X: gag reflex reduced  XI: bilateral shoulder shrug  XII: midline tongue extension  Motor:  Right : Upper extremity 5/5 Left: Upper extremity 2+/5  Lower extremity 5/5 Lower extremity 5-/5  Tone and bulk:normal tone throughout; no atrophy noted  Sensory: Pinprick and light touch decreased in the left upper extremity  Deep Tendon Reflexes: 1+ and symmetric throughout with absent AJ's bilaterally  Plantars:  Right: downgoing  Left: upgoing  Cerebellar:  normal finger-to-nose on the right and normal heel-to-shin testing bilaterally. Finger to nose difficult on the left due to weakness  Gait: Unable to test  CV: pulses palpable throughout    ASSESSMENT [name] is a 68 y.o. male presenting with difficulty with speech, left sided weakness. S/P IV t-PA 11/16/2013 at 0058. Imaging pending. 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 Infarct secondary to unknown source. With history of recurrent symptoms would have concern over vessel stenosis. On aspirin 81 mg orally every day prior to admission. Now on no antithrombotic for secondary stroke prevention. Work up underway.  hypertension Hyperlipidemia, LDL 95, on zocor 40 mg daily PTA, now on no statin, goal LDL < 100  Hyperglycemia, HgbA1c pending  CAD - CABG w/ stent 2003  PVD  Carotid stenosis  History of DVT  Cigarette smoker  Hospital day # 2  TREATMENT/PLAN  Holding antiplatelet therapy due to ICH/SAH on repeat head CT. Repeat head CT on 1/04 stable.  ICH appears clinically stable, no neurosurgical intervention at this time. Will continue to monitor  Consider VVS consult as outpatient  Consider TEE for stroke evaluation  Resume statin when able to swallow  PT/OT/SLT consult  Transfer out of ICU today   This patient is critically ill and at significant risk of neurological worsening, death and care requires constant monitoring of vital signs, hemodynamics,respiratory and cardiac monitoring, neurological assessment, other specialists and medical decision making of high complexity.  A total of 2min was spent with this patient.   Jim Like, DO Neurology-Stroke

## 2013-11-18 NOTE — Evaluation (Signed)
Physical Therapy Evaluation Patient Details Name: Joshua Horton MRN: [id number] DOB: 1946/05/25 Today's Date: 11/18/2013 Time: 5427-0623 PT Time Calculation (min): 18 min  PT Assessment / Plan / Recommendation History of Present Illness  Joshua Horton is an 68 y.o. male who was at home with his wife this evening 11/15/2013. While speaking with his wife had acute onset difficulty with speech. Then noted weakness on his left side as well. EMS was called at that time and the patient was brought in as a code stroke. CBG 118. Initial NIHSS of 5.  Patient given TPA.  Clinical Impression  Patient presents with problems listed below.  Will benefit from acute PT to maximize independence prior to discharge.  Patient was independent pta.  Recommend Inpatient Rehab consult for intense therapy to maximize functional independence prior to return home with family.    PT Assessment  Patient needs continued PT services    Follow Up Recommendations  CIR    Does the patient have the potential to tolerate intense rehabilitation      Barriers to Discharge        Equipment Recommendations  Wheelchair (measurements PT);Wheelchair cushion (measurements PT)    Recommendations for Other Services Rehab consult   Frequency Min 4X/week    Precautions / Restrictions Precautions Precautions: Fall Restrictions Weight Bearing Restrictions: No   Pertinent Vitals/Pain       Mobility  Bed Mobility Bed Mobility: Rolling Left;Left Sidelying to Sit;Sitting - Scoot to Edge of Bed Rolling Left: 3: Mod assist;With rail Left Sidelying to Sit: 3: Mod assist;HOB elevated Sitting - Scoot to Edge of Bed: 3: Mod assist Details for Bed Mobility Assistance: Verbal cues for technique.   Transfers Transfers: Sit to Stand;Stand to Sit;Stand Pivot Transfers Sit to Stand: 1: +2 Total assist;With upper extremity assist;From bed Sit to Stand: Patient Percentage: 60% Stand to Sit: 1: +2 Total assist;With upper extremity  assist;With armrests;To chair/3-in-1 Stand to Sit: Patient Percentage: 60% Stand Pivot Transfers: 1: +2 Total assist Stand Pivot Transfers: Patient Percentage: 60% Details for Transfer Assistance: Verbal cues for Rt hand placement and technique.  Assist to support LUE.  Assist to rise to standing and for balance.  Able to take several steps to pivot to chair. Ambulation/Gait Ambulation/Gait Assistance: Not tested (comment) Modified Rankin (Stroke Patients Only) Pre-Morbid Rankin Score: No symptoms Modified Rankin: Moderately severe disability    Exercises     PT Diagnosis: Difficulty walking;Hemiplegia dominant side  PT Problem List: Decreased strength;Decreased activity tolerance;Decreased balance;Decreased mobility;Decreased coordination;Decreased knowledge of use of DME;Decreased knowledge of precautions;Cardiopulmonary status limiting activity PT Treatment Interventions: DME instruction;Gait training;Functional mobility training;Balance training;Neuromuscular re-education;Patient/family education     PT Goals(Current goals can be found in the care plan section) Acute Rehab PT Goals Patient Stated Goal: to go home  PT Goal Formulation: With patient/family Time For Goal Achievement: 12/02/13 Potential to Achieve Goals: Good  Visit Information  Last PT Received On: 11/18/13 Assistance Needed: +2 History of Present Illness: Joshua Horton is an 68 y.o. male who was at home with his wife this evening 11/15/2013. While speaking with his wife had acute onset difficulty with speech. Then noted weakness on his left side as well. EMS was called at that time and the patient was brought in as a code stroke. CBG 118. Initial NIHSS of 5.  Patient given TPA.       Prior [place] expects to be discharged to:: Inpatient rehab Living Arrangements: Spouse/significant other Available Help at  Discharge: Family;Available 24 hours/day Type of Home: House Home Access:  Stairs to enter [employer] of Steps: 2 Entrance Stairs-Rails: Right;Left Home Layout: One level Home Equipment: None Prior Function Level of Independence: Independent Comments: Drives Communication Communication: Expressive difficulties Dominant Hand: Left    Cognition  Cognition Arousal/Alertness: Awake/alert Behavior During Therapy: WFL for tasks assessed/performed Overall Cognitive Status: Within Functional Limits for tasks assessed    Extremity/Trunk Assessment Upper Extremity Assessment Upper Extremity Assessment: LUE deficits/detail LUE Deficits / Details: Strength 2+/5 at shoulder/elbow.  No movement noted in hand. LUE Coordination: decreased gross motor Lower Extremity Assessment Lower Extremity Assessment: LLE deficits/detail LLE Deficits / Details: Strength grossly 4/5 LLE Coordination: decreased gross motor Cervical / Trunk Assessment Cervical / Trunk Assessment: Normal   Balance Balance Balance Assessed: Yes Static Sitting Balance Static Sitting - Balance Support: No upper extremity supported;Feet supported Static Sitting - Level of Assistance: 5: Stand by assistance Static Sitting - Comment/# of Minutes: 3 Static Standing Balance Static Standing - Balance Support: Right upper extremity supported Static Standing - Level of Assistance: 3: Mod assist Static Standing - Comment/# of Minutes: 2 minutes.  Patient with posterior and left lean.  End of Session PT - End of Session Equipment Utilized During Treatment: Gait belt Activity Tolerance: Patient limited by fatigue Patient left: in chair;with call bell/phone within reach;with family/visitor present Nurse Communication: Mobility status  GP     Despina Pole 11/18/2013, 10:06 AM  Carita Pian. Sanjuana Kava, [place] Pager [phone]

## 2013-11-19 ENCOUNTER — Encounter (HOSPITAL_COMMUNITY)
Admission: EM | Disposition: A | Discharge status: Rehabilitation Facility | Payer: Self-pay | Source: Home / Self Care | Attending: Neurology

## 2013-11-19 ENCOUNTER — Encounter (HOSPITAL_COMMUNITY): Payer: Self-pay | Admitting: *Deleted

## 2013-11-19 DIAGNOSIS — I6350 Cerebral infarction due to unspecified occlusion or stenosis of unspecified cerebral artery: Secondary | ICD-10-CM

## 2013-11-19 DIAGNOSIS — I6340 Cerebral infarction due to embolism of unspecified cerebral artery: Secondary | ICD-10-CM

## 2013-11-19 HISTORY — PX: LOOP RECORDER IMPLANT: SHX5477

## 2013-11-19 HISTORY — PX: LOOP RECORDER IMPLANT: SHX5954

## 2013-11-19 HISTORY — PX: TEE WITHOUT CARDIOVERSION: SHX5443

## 2013-11-19 SURGERY — LOOP RECORDER IMPLANT
Anesthesia: LOCAL

## 2013-11-19 SURGERY — ECHOCARDIOGRAM, TRANSESOPHAGEAL
Anesthesia: Moderate Sedation

## 2013-11-19 MED ORDER — LIDOCAINE-EPINEPHRINE 1 %-1:100000 IJ SOLN
INTRAMUSCULAR | Status: AC
  Filled 2013-11-19: qty 1

## 2013-11-19 MED ORDER — SODIUM CHLORIDE 0.9 % IV SOLN: INTRAVENOUS | Status: DC

## 2013-11-19 MED ORDER — SIMVASTATIN 40 MG PO TABS: 40.0000 mg | ORAL_TABLET | Freq: Every evening | ORAL | Status: DC

## 2013-11-19 MED ORDER — DIPHENHYDRAMINE HCL 50 MG/ML IJ SOLN
INTRAMUSCULAR | Status: AC
  Filled 2013-11-19: qty 1

## 2013-11-19 MED ORDER — FENTANYL CITRATE 0.05 MG/ML IJ SOLN
INTRAMUSCULAR | Status: DC | PRN
  Administered 2013-11-19: 25 ug via INTRAVENOUS

## 2013-11-19 MED ORDER — ATORVASTATIN CALCIUM 20 MG PO TABS
20.0000 mg | ORAL_TABLET | Freq: Every day | ORAL | Status: DC
  Administered 2013-11-19 – 2013-11-20 (×2): 20 mg via ORAL
  Filled 2013-11-19 (×3): qty 1

## 2013-11-19 MED ORDER — MIDAZOLAM HCL 5 MG/ML IJ SOLN
INTRAMUSCULAR | Status: AC
  Filled 2013-11-19: qty 2

## 2013-11-19 MED ORDER — BUTAMBEN-TETRACAINE-BENZOCAINE 2-2-14 % EX AERO
INHALATION_SPRAY | CUTANEOUS | Status: DC | PRN
  Administered 2013-11-19: 2 via TOPICAL

## 2013-11-19 MED ORDER — MIDAZOLAM HCL 10 MG/2ML IJ SOLN
INTRAMUSCULAR | Status: DC | PRN
Start: 2013-11-19 — End: 2013-11-19
  Administered 2013-11-19: 1 mg via INTRAVENOUS
  Administered 2013-11-19: 2 mg via INTRAVENOUS

## 2013-11-19 MED ORDER — FENTANYL CITRATE 0.05 MG/ML IJ SOLN
INTRAMUSCULAR | Status: AC
  Filled 2013-11-19: qty 2

## 2013-11-19 NOTE — Consult Note (Signed)
Physical Medicine and Rehabilitation Consult  Reason for Consult: Left sided weakness and difficulty speaking. Referring Physician: Dr. Leonie Man   HPI: Joshua Horton is a 68 y.o. Left handed male with history of CAD, HTN, COPD; who was admitted on 11/16/13 with acute onset of speech difficulty and left sided weakness.  Patient reported similar transient symptoms on 11/03/13. CT head without acute changes. Treated with tPA.   MRI/MRA brain with scattered acute right MCA infarcts, most confluent at the posterior insula superimposed rigth occipital lobe hemorrhagic infarct with focal hematoma 30 X 19 X 13 mm, trace SAH over posterior right hemisphere,  R MCA M2 segment occlusion/stenosis and suspicion of plaque or thrombus supraclinoid R- ICA causing mild to moderate stenosis. Carotid dopplers with 40-59% R--ICA stenosis and  1-39%  L-ICA stenosis.  2D echo with EF 65-70% with grade 1 diastolic dysfunction and no wall abnormality.   MBS done on 01/03 showing multifactorial dysphagia with delayed transit and moderate residue therefore placed on D1, thin liquids. 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  For TEE tomorrow to rule out thrombus--if negative to have loop recorder placed to rule out A fib.  Therapies initiated and MD, PT, ST recommending CIR.    Review of Systems  Eyes: Negative for blurred vision and double vision.  Respiratory: Positive for cough. Negative for shortness of breath and wheezing.   Cardiovascular: Negative for chest pain and palpitations.  Gastrointestinal: Negative for nausea, vomiting and constipation.       H/o hiccups.   Genitourinary: Negative for dysuria and urgency.  Musculoskeletal: Positive for joint pain (H/o bilateral hip pain. Left RTC pathology--needs surgery. ) and myalgias.  Neurological: Positive for speech change, focal weakness and headaches.    Past Medical History  Diagnosis Date  . Coronary artery  disease   . Wheezing   . Chronic cough   . Peripheral vascular disease   . Hypertension   . Hyperlipidemia   . Reflux   . COPD (chronic obstructive pulmonary disease)    Past Surgical History  Procedure Laterality Date  . Coronary angioplasty with stent placement  2003  . Cervical disc surgery      Dr Vertell Limber   Family History  Problem Relation Age of Onset  . Heart disease    . Arthritis    . Cancer    . Kidney disease    . Cancer Mother   . Deep vein thrombosis Father   . Heart disease Father   . Hyperlipidemia Father   . Hypertension Father   . Heart attack Father   . Peripheral vascular disease Father    Social History:  Married. Retired from [employer]. He  reports that he quit smoking about 7 months ago. His smoking use included Cigarettes. He smoked 1.00 pack per day. He has never used smokeless tobacco. He reports that he does not drink alcohol or use illicit drugs.   Allergies  Allergen Reactions  . Ivp Dye [Iodinated Diagnostic Agents]    Medications Prior to Admission  Medication Sig Dispense Refill  . nitroGLYCERIN (NITROSTAT) 0.4 MG SL tablet Place 0.4 mg under the tongue every 5 (five) minutes as needed for chest pain.      Marland Kitchen omeprazole (PRILOSEC) 20 MG capsule Take 20 mg by mouth daily.      . simvastatin (ZOCOR) 40 MG tablet Take 40 mg by mouth every evening.      . [DISCONTINUED] aspirin 81 MG tablet  Take 81 mg by mouth daily.      . [DISCONTINUED] lisinopril-hydrochlorothiazide (PRINZIDE,ZESTORETIC) 10-12.5 MG per tablet Take 1 tablet by mouth daily.        Home: Home Living Family/patient expects to be discharged to:: Inpatient rehab Living Arrangements: Spouse/significant other Available Help at Discharge: Family;Available 24 hours/day Type of Home: House Home Access: Stairs to enter [employer] of Steps: 2 Entrance Stairs-Rails: Right;Left Home Layout: One level Home Equipment: None  Functional History: Prior  Function Comments: Drives Functional Status:  Mobility: Bed Mobility Bed Mobility: Rolling Left;Left Sidelying to Sit;Sitting - Scoot to Edge of Bed Rolling Left: 3: Mod assist;With rail Left Sidelying to Sit: 3: Mod assist;HOB elevated Sitting - Scoot to Edge of Bed: 3: Mod assist Transfers Transfers: Sit to Stand;Stand to Sit;Stand Pivot Transfers Sit to Stand: 1: +2 Total assist;With upper extremity assist;From bed Sit to Stand: Patient Percentage: 60% Stand to Sit: 1: +2 Total assist;With upper extremity assist;With armrests;To chair/3-in-1 Stand to Sit: Patient Percentage: 60% Stand Pivot Transfers: 1: +2 Total assist Stand Pivot Transfers: Patient Percentage: 60% Ambulation/Gait Ambulation/Gait Assistance: Not tested (comment)    ADL:    Cognition: Cognition Overall Cognitive Status: Within Functional Limits for tasks assessed Orientation Level: Oriented X4 Cognition Arousal/Alertness: Awake/alert Behavior During Therapy: WFL for tasks assessed/performed Overall Cognitive Status: Within Functional Limits for tasks assessed  Blood pressure 148/78, pulse 81, temperature 97.7 F (36.5 C), temperature source Oral, resp. rate 20, height 5\' 10"  (1.778 m), weight 101.1 kg (222 lb 14.2 oz), SpO2 96.00%. Physical Exam  Nursing note and vitals reviewed. Constitutional: He is oriented to person, place, and time. He appears well-developed and well-nourished.  HENT:  Head: Normocephalic and atraumatic.  Multiple dental caries.   Eyes: Conjunctivae are normal.  Neck: Normal range of motion. Neck supple.  Cardiovascular: Normal rate and regular rhythm.   Respiratory: Effort normal and breath sounds normal. No respiratory distress. He has no wheezes.  GI: Bowel sounds are normal. He exhibits no distension. There is no tenderness.  Round and protuberant.   Musculoskeletal: He exhibits no edema and no tenderness.  Neurological: He is alert and oriented to person, place, and time.   Moderate to severe dysarthria with left facial droop. Follows basic commands without difficulty. LUE>LLE weakness. LUE is trace at biceps, deltoid. LLE is 3-4/5. Senses pinch, general touch  Skin: Skin is warm and dry.  Psychiatric: He has a normal mood and affect. His behavior is normal.    No results found for this or any previous visit (from the past 24 hour(s)). Ct Head Wo Contrast  11/18/2013   CLINICAL DATA:  Evaluate involving the right MCA territory infarcts and intracranial hemorrhage.  EXAM: CT HEAD WITHOUT CONTRAST  TECHNIQUE: Contiguous axial images were obtained from the base of the skull through the vertex without intravenous contrast.  COMPARISON:  Prior CT from 11/17/2013  FINDINGS: Focal hypodensity involving the cortical gray matter and subcortical white matter of the right superior insula/operculum is again seen, similar in size and distribution measuring 1.9 x 3.7 cm. Additional scattered right MCA territory infarcts are similar as well. Hematoma within the right occipital lobe is slightly decreased in size measuring approximately 2.5 x 1.8 cm (previously 3.0 x 1.9 cm). Subarachnoid hemorrhage within the a adjacent right occipital, parietal, and temporal lobes is decreased in conspicuity, likely related to redistribution. Trace left-sided subarachnoid is also decreasing conspicuity. No new intracranial hemorrhage identified. No extra-axial fluid collection. There is no intraventricular hemorrhage.  No  hydrocephalus.  No significant midline shift.  No new large vessel territory infarct.  Calvarium is intact.  Orbits are normal.  Paranasal sinuses mastoid air cells remain clear.  IMPRESSION: 1. Normal expected interval evolution of scattered right MCA territory infarcts, most confluent within the operculum. 2. Interval decrease in size of right occipital hematoma, now measuring approximately 2.5 x 1.8 cm, previously 3.0 x 1.9 cm. Scattered subarachnoid hemorrhage is also decreased in  conspicuity, likely related to redistribution. 3. No new intracranial hemorrhage or other process identified. 4. No midline shift or hydrocephalus.   Electronically Signed   By: Jeannine Boga M.D.   On: 11/18/2013 06:54     Assessment/Plan: Diagnosis: Right MCA infarct/Occipital hematoma 1. Does the need for close, 24 hr/day medical supervision in concert with the patient's rehab needs make it unreasonable for this patient to be served in a less intensive setting? Yes 2. Co-Morbidities requiring supervision/potential complications: htn, copd, cad 3. Due to bladder management, bowel management, safety, skin/wound care, disease management, medication administration and patient education, does the patient require 24 hr/day rehab nursing? Yes 4. Does the patient require coordinated care of a physician, rehab nurse, PT (1-2 hrs/day, 5 days/week), OT (1-2 hrs/day, 5 days/week) and SLP (1-2 hrs/day, 5 days/week) to address physical and functional deficits in the context of the above medical diagnosis(es)? Yes Addressing deficits in the following areas: balance, endurance, locomotion, strength, transferring, bowel/bladder control, bathing, dressing, feeding, grooming, toileting, speech, language, swallowing and psychosocial support 5. Can the patient actively participate in an intensive therapy program of at least 3 hrs of therapy per day at least 5 days per week? Yes 6. The potential for patient to make measurable gains while on inpatient rehab is excellent 7. Anticipated functional outcomes upon discharge from inpatient rehab are mod I to supervision with PT, supervision to min assist with OT, supervision with SLP. 8. Estimated rehab length of stay to reach the above functional goals is: 13-18 days 9. Does the patient have adequate social supports to accommodate these discharge functional goals? Yes 10. Anticipated D/C setting: Home 11. Anticipated post D/C treatments: [place] therapy 12. Overall  Rehab/Functional Prognosis: excellent  RECOMMENDATIONS: This patient's condition is appropriate for continued rehabilitative care in the following setting: CIR Patient has agreed to participate in recommended program. Yes Note that insurance prior authorization may be required for reimbursement for recommended care.  Comment: Rehab RN to follow up.   Meredith Staggers, MD, Mellody Drown     11/19/2013

## 2013-11-19 NOTE — Consult Note (Signed)
ELECTROPHYSIOLOGY CONSULT NOTE  Patient ID: Joshua Horton, MRN: [id number], DOB/AGE: 69/16/1947 69 y.o. Admit date: 11/16/2013 Date of Consult: 11/19/2013  Primary Physician: Donzetta Sprung, MD Primary Cardiologist:    Chief Complaint: stroke   HPI Joshua Horton is a 68 y.o. male  Presented 11/16/13 with acute onset of difficulty with speech. A prior episode occurred a few days before. That event terminated spontaneously. This event was treated with TPA complicated by intracerebral hematoma.  He has residual left hemiparesis  He was recently hospitalized at [facility] for evaluation of dyspnea on exertion. Nothing was found. Myoview scanning [facility] fall 2014 demonstrated normal LV function and no ischemia with a small fixed inferior defect. Echocardiogram 1/15 was normal   Telemetry has been unrevealing.  The patient has no prior history of atrial fibrillation  Thromboembolic risk factors are minimal for age, hypertension, vascular disease and    Past Medical History  Diagnosis Date  . Coronary artery disease   . Wheezing   . Chronic cough   . Peripheral vascular disease   . Hypertension   . Hyperlipidemia   . Reflux   . COPD (chronic obstructive pulmonary disease)   . Myocardial infarction   . CHF (congestive heart failure)       Surgical History:  Past Surgical History  Procedure Laterality Date  . Coronary angioplasty with stent placement  2003  . Cervical disc surgery      Dr Venetia Maxon     Home Meds: Prior to Admission medications   Medication Sig Start Date End Date Taking? Authorizing Provider  nitroGLYCERIN (NITROSTAT) 0.4 MG SL tablet Place 0.4 mg under the tongue every 5 (five) minutes as needed for chest pain.   Yes Historical Provider, MD  omeprazole (PRILOSEC) 20 MG capsule Take 20 mg by mouth daily.   Yes Historical Provider, MD  simvastatin (ZOCOR) 40 MG tablet Take 40 mg by mouth every evening.   Yes Historical Provider, MD    Inpatient  Medications:  . [MAR HOLD] amLODipine  5 mg Oral Daily  . [facility] HOLD] atorvastatin  20 mg Oral q1800  . [MAR HOLD] pantoprazole (PROTONIX) IV  40 mg Intravenous QHS     Allergies:  Allergies  Allergen Reactions  . Ivp Dye [Iodinated Diagnostic Agents]     History   Social History  . Marital Status: Married    Spouse Name: N/A    Number of Children: N/A  . Years of Education: N/A   Occupational History  . Not on file.   Social History Main Topics  . Smoking status: Former Smoker -- 1.00 packs/day    Types: Cigarettes    Quit date: 04/03/2013  . Smokeless tobacco: Never Used  . Alcohol Use: No  . Drug Use: No  . Sexual Activity: Not on file   Other Topics Concern  . Not on file   Social History Narrative  . No narrative on file     Family History  Problem Relation Age of Onset  . Heart disease    . Arthritis    . Cancer    . Kidney disease    . Cancer Mother   . Deep vein thrombosis Father   . Heart disease Father   . Hyperlipidemia Father   . Hypertension Father   . Heart attack Father   . Peripheral vascular disease Father      ROS:  Please see the history of present illness.     All other  systems reviewed and negative.    Physical Exam: Blood pressure 159/84, pulse 88, temperature 98.2 F (36.8 C), temperature source Oral, resp. rate 20, height 5\' 10"  (1.778 m), weight 222 lb 14.2 oz (101.1 kg), SpO2 95.00%. General: Well developed, morbidly obese Caucasian older than age appearing male in no acute distress. Head: Normocephalic, atraumatic, sclera non-icteric, no xanthomas, nares are without discharge. EENT: Edentulous Lymph Nodes:  none Back: without scoliosis/kyphosis , no CVA tendersness Neck: Negative for carotid bruits. JVD not elevated. Lungs: Clear bilaterally to auscultation without wheezes, rales, or rhonchi. Breathing is unlabored. Heart: RRR with S1 S2. No 6 systolic murmur , rubs, or gallops appreciated. Abdomen: Soft, non-tender,  non-distended with normoactive bowel sounds. No hepatomegaly. No rebound/guarding. No obvious abdominal masses. Msk:  Strength and tone appear normal for age. Extremities: No clubbing or cyanosis. No  edema.  Distal pedal pulses are 2+ and equal bilaterally. Skin: Warm and Dry Neuro: Alert and oriented ; speech rather slurred. CN III-XII intact Grossly normal sensory and motor function . Psych:  Responds to questions appropriately with a normal affect.      Labs: Cardiac Enzymes No results found for this basename: CKTOTAL, CKMB, TROPONINI,  in the last 72 hours CBC Lab Results  Component Value Date   WBC 6.4 11/18/2013   HGB 15.3 11/18/2013   HCT 45.9 11/18/2013   MCV 88.4 11/18/2013   PLT 211 11/18/2013   PROTIME: No results found for this basename: LABPROT, INR,  in the last 72 hours Chemistry  Recent Labs Lab 11/16/13 0042  11/18/13 0350  NA 135*  < > 140  K 4.0  < > 3.8  CL 96  < > 103  CO2 25  --  26  BUN 9  < > 13  CREATININE 0.83  < > 0.87  CALCIUM 9.2  --  9.1  PROT 7.5  --   --   BILITOT 0.3  --   --   ALKPHOS 105  --   --   ALT 37  --   --   AST 30  --   --   GLUCOSE 135*  < > 96  < > = values in this interval not displayed. Lipids Lab Results  Component Value Date   CHOL 169 11/16/2013   HDL 32* 11/16/2013   LDLCALC 94 11/16/2013   TRIG 215* 11/16/2013   BNP No results found for this basename: probnp   Miscellaneous No results found for this basename: DDIMER    Radiology/Studies:  Dg Chest 2 View  11/16/2013   CLINICAL DATA:  Shortness of breath.  Hypertension.  EXAM: CHEST  2 VIEW  COMPARISON:  07/12/2013.  FINDINGS: Mediastinum and hilar structures are normal. Heart size is normal. Lungs are clear. Calcified nodule left upper lobe consistent with granuloma. Prior cervical spine fusion . Diffuse thoracic spine osteopenia, degenerative change, and mild vertebral compressions.  IMPRESSION: No acute cardiopulmonary disease.   Electronically Signed   By: Marcello Moores   Register   On: 11/16/2013 12:46    11/17/2013   CLINICAL DATA:  Follow-up status post tPA  EXAM: CT HEAD WITHOUT CONTRAST  TE Intervals 19/09/33 Telemetry: Sinus rhythm  Assessment and Plan:    #1. Ischemic stroke-cryptogenic 2. Dyspnea on exertion 3. Evaluation includes Myoview and echo both of which are normal 4. Likely obstructive sleep apnea #5. Morbid obesity #6. Hypertension   Patient presents with ischemic stroke complicated by a small hematoma following TPA. TEE to be undertaken for source  of emboli. In the event that this is negative will proceed with the proper implantation. I have reviewed risks and benefits with the patient and family. They're agreeable and willing to proceed    Virl Axe

## 2013-11-19 NOTE — CV Procedure (Signed)
Pre op Dx cryptogenic stroke Post op Dx    Procedure  Loop Recorder implantation  After routine prep and drape of the left parasternal area, a small incision was created. A [employer] LINQ Reveal Loop Recorder  Serial Number  [id number] was inserted.    Dermabond was applied.  The patient tolerated the procedure without apparent complication.

## 2013-11-19 NOTE — Progress Notes (Signed)
Stroke Team Progress Note  HISTORY Joshua Horton is an 68 y.o. male who was at home with his wife this evening 11/15/2013. While speaking with his wife had acute onset difficulty with speech. Then noted weakness on his left side as well. EMS was called at that time and the patient was brought in as a code stroke. CBG 118. Initial NIHSS of 5. The patient reports that he had a similar episode on 11/12/2013. His symptoms resolved completely after about 30 minutes and the patient refused transport by EMS. Medical attention was not sought.  Patient was given TPA. He was admitted to the neuro ICU  for further evaluation and treatment.  SUBJECTIVE Wife at the bedside. She feels he is improving.  OBJECTIVE Most recent Vital Signs: Filed Vitals:   11/19/13 0440 11/19/13 0530 11/19/13 0805 11/19/13 0828  BP:   148/78   Pulse:   81   Temp: 98.3 F (36.8 C)   97.7 F (36.5 C)  TempSrc: Oral   Oral  Resp:   20   Height:      Weight:  101.1 kg (222 lb 14.2 oz)    SpO2:   96%    CBG (last 3)  No results found for this basename: GLUCAP,  in the last 72 hours  IV Fluid Intake:   . sodium chloride 75 mL/hr at 11/19/13 0800    MEDICATIONS  . amLODipine  5 mg Oral Daily  . pantoprazole (PROTONIX) IV  40 mg Intravenous QHS   PRN:  acetaminophen, acetaminophen, labetalol, senna-docusate  Diet:  Dysphagia 1 thin liquids Activity:  OOB DVT Prophylaxis:  SCDs   CLINICALLY SIGNIFICANT STUDIES Basic Metabolic Panel:   Recent Labs Lab 11/16/13 0042 11/16/13 0049 11/18/13 0350  NA 135* 135* 140  K 4.0 3.8 3.8  CL 96 100 103  CO2 25  --  26  GLUCOSE 135* 136* 96  BUN 9 8 13   CREATININE 0.83 0.90 0.87  CALCIUM 9.2  --  9.1   Liver Function Tests:   Recent Labs Lab 11/16/13 0042  AST 30  ALT 37  ALKPHOS 105  BILITOT 0.3  PROT 7.5  ALBUMIN 3.9   CBC:   Recent Labs Lab 11/16/13 0042 11/16/13 0049 11/18/13 0350  WBC 7.4  --  6.4  NEUTROABS 3.6  --  3.5  HGB 15.8 17.0 15.3   HCT 46.5 50.0 45.9  MCV 87.6  --  88.4  PLT 216  --  211   Coagulation:   Recent Labs Lab 11/16/13 0042  LABPROT 12.2  INR 0.92   Cardiac Enzymes:   Recent Labs Lab 11/16/13 0042  TROPONINI <0.30   Urinalysis: No results found for this basename: COLORURINE, APPERANCEUR, LABSPEC, [place], GLUCOSEU, HGBUR, BILIRUBINUR, KETONESUR, PROTEINUR, UROBILINOGEN, NITRITE, LEUKOCYTESUR,  in the last 168 hours Lipid Panel    Component Value Date/Time   CHOL 169 11/16/2013 0402   TRIG 215* 11/16/2013 0402   HDL 32* 11/16/2013 0402   CHOLHDL 5.3 11/16/2013 0402   VLDL 43* 11/16/2013 0402   LDLCALC 94 11/16/2013 0402   HgbA1C  Lab Results  Component Value Date   HGBA1C 6.5* 11/16/2013    Urine Drug Screen:   No results found for this basename: labopia,  cocainscrnur,  labbenz,  amphetmu,  thcu,  labbarb    Alcohol Level:   Recent Labs Lab 11/16/13 [place] <11     CT of the brain   11/18/2013    1. Normal expected interval  evolution of scattered right MCA territory infarcts, most confluent within the operculum. 2. Interval decrease in size of right occipital hematoma, now measuring approximately 2.5 x 1.8 cm, previously 3.0 x 1.9 cm. Scattered subarachnoid hemorrhage is also decreased in conspicuity, likely related to redistribution. 3. No new intracranial hemorrhage or other process identified. 4. No midline shift or hydrocephalus.   11/17/2013   1. Similar size and distribution of scattered right MCA territory infarcts, most confluent within the right operculum. 2. Stable size of right occipital lobe hemorrhagic infarction with hematoma, measuring approximately 3.0 x 1.9 cm. 3. Subarachnoid hemorrhage within the posterior right temporal and right occipital lobe, similar to prior. Trace subarachnoid now visible within the right frontal and left frontal lobes, which may be related to redistribution. No intraventricular or definite new intracranial hemorrhage. 4. No significant mass effect, midline  shift, or hydrocephalus at this time. No new infarct.    11/16/2013   Focal low-attenuation change in the deep white matter on the left suggesting age-indeterminate ischemic focus. No mass effect or midline shift. No acute intracranial hemorrhage. Diffuse cerebral atrophy.    MRI of the brain  11/16/2013   1. Scattered acute right MCA infarcts, most confluent at the posterior insula. 2. Superimposed right occipital lobe hemorrhagic infarction with focal hematoma encompassing 30 x 19 x 13 mm. 3. Superimposed small volume of subarachnoid hemorrhage in the right hemisphere. 4. No intraventricular hemorrhage, ventriculomegaly, or significant mass effect at this time.   MRA of the brain  11/16/2013   Right MCA M2 segment occlusions/stenoses. Middle and posterior sylvian branches most affected. Right MCA M1 segment remains patent.  Suspect plaque or thrombus in the supraclinoid right ICA causing mild to moderate stenosis.   2D Echocardiogram  Normal LV size and vigorous systolic function, EF 89-38%. Normal RV size and systolic function. No significant valvular abnormalities.  Carotid Doppler  Findings suggest 40-59% right internal carotid artery stenosis, 1-39% left internal carotid artery stenosis. Vertebral arteries are patent with antegrade flow.  CXR  11/16/2013    No acute cardiopulmonary disease.     EKG  normal sinus rhythm.   Therapy Recommendations CIR  Physical Exam   General Examination:  HEENT- Normocephalic, no lesions, without obvious abnormality. Normal external eye and conjunctiva. Normal external nose, mucus membranes and septum. Normal pharynx.  Neck supple with no masses, nodes, nodules or enlargement.  Cardiovascular - S1, S2 normal  Lungs - chest clear, no wheezing, rales, normal symmetric air entry  Abdomen - soft, non-tender; bowel sounds normal; Extremities - mild edema in lower extremities bilaterally  Neurologic Examination:  Mental Status:  Alert, oriented, thought content  appropriate. Speech fluent but markedly dysarthric. Able to follow 3 step commands without difficulty.  Cranial Nerves:  II: Visual fields grossly normal, pupils equal, round, reactive to light III,IV, VI: ptosis not present, extra-ocular motions intact bilaterally  V,VII: left facial droop, facial light touch sensation decreased on the left side of the face  VIII: hearing normal bilaterally  IX,X: gag reflex reduced  XI: bilateral shoulder shrug  XII: midline tongue extension  Motor:  Right : Upper extremity 5/5 Left: Upper extremity 3/5  Lower extremity 5/5 Lower extremity 5-/5  Tone and bulk:normal tone throughout; no atrophy noted  Sensory: Pinprick and light touch decreased in the left upper extremity  Deep Tendon Reflexes: 1+ and symmetric throughout with absent AJ's bilaterally  Plantars:  Right: downgoing Left: upgoing  Cerebellar:  normal finger-to-nose on the right and normal heel-to-shin testing  bilaterally. Finger to nose difficult on the left due to weakness  Gait: Unable to test  CV: pulses palpable throughout   ASSESSMENT Mr. HERMON ZEA is a 68 y.o. male presenting with difficulty with speech, left sided weakness. S/P IV t-PA 11/16/2013 at 0058. Imaging shows scattered right MCA infarcts, right occipital lobe hemorrhagic infarction with focal hematoma encompassing 30 x 19 x 13 mm with small volume of right subarachnoid hemorrhage. Patient with post-tPA hemorrhagic transformation,clinically asymptomatic. Infarct felt to be embolic secondary to unknown source.  On aspirin 81 mg orally every day prior to admission. Now on no antithrombotic for secondary stroke prevention given hemorrhe. Work up underway.  hypertension Hyperlipidemia, LDL 95, on zocor 40 mg daily PTA, now on no statin, at goal LDL < 100  Hyperglycemia, HgbA1c 6.5  CAD - CABG w/ stent 2003  PVD  Carotid stenosis, R 40-59%  History of DVT  Cigarette smoker  COPD  Hospital day #  3  TREATMENT/PLAN Agree with plans to transfer to the floor, change to telemetry Rehab consult Resume statin TEE to look for embolic source. Arranged with [facility] [place] for tomorrow.  If positive for PFO (patent foramen ovale), check bilateral lower extremity venous dopplers to rule out DVT as possible source of stroke. (I have made patient NPO after midnight tonight). If TEE negative, a [place] electrophysiologist will consult and place implantable loop recorder to evaluate for atrial fibrillation as etiology of stroke. This has been explained to patient/family by Dr. Leonie Man and they are agreeable. D/W wife and answered questions  Burnetta Sabin, MSN, RN, ANVP-BC, ANP-BC, GNP-BC [facility] Pager: [phone] 11/19/2013 9:22 AM  I have personally obtained a history, examined the patient, evaluated imaging results, and formulated the assessment and plan of care. I agree with the above.  Antony Contras, MD

## 2013-11-19 NOTE — Progress Notes (Signed)
Rehab Admissions Coordinator Note:  Patient was screened by Retta Diones for appropriateness for an Inpatient Acute Rehab Consult.  At this time, we are recommending Inpatient Rehab consult.  Jodell Cipro M 11/19/2013, 9:25 AM  I can be reached at [phone].

## 2013-11-19 NOTE — CV Procedure (Signed)
TEE:  3 mg versed and 25 ug fentanyl  Normal LV EF 65% LAE Normal RA/RV Normal AV/MV/TV/PV No ASD  Lipomatous hypertrophy No PFO negative bubble No LAA thrombus Mild mural aortic debris No effusion  Dr Caryl Comes to proceed with ILR implantation  Joshua Horton

## 2013-11-19 NOTE — Progress Notes (Signed)
Speech Language Pathology Treatment: Dysphagia  Patient Details Name: Joshua Horton MRN: [id number] DOB: 1946/01/04 Today's Date: 11/19/2013 Time: 1025-8527 SLP Time Calculation (min): 16 min  Assessment / Plan / Recommendation Clinical Impression  F/u dysphagia treatment revealed generally good tolerance of dysphagia 1 diet with thin liquids. Patient self feeding with independent small bites, slow rate, and double swallow. Moderate clinician cueing for increased awareness to left anterior labial spillage of both liquids and solids utilizing lingual sweep and towel to wipe mouth. Appears to be tolerating diet at this time. Will f/u for potential to advance and cognitive-linguistic evaluation.    HPI HPI: [name] is a 68 y.o. male presenting with difficulty with speech, left sided weakness. S/P IV t-PA 11/16/2013 at 0058. MRI shows Scattered acute right MCA infarcts, most confluent at the posterior insula; Superimposed right occipital lobe hemorrhagic infarction with focal hematoma encompassing 30 x 19 x 13 mm. Superimposed small volume of subarachnoid hemorrhage in the right hemisphere.      SLP Plan  Continue with current plan of care    Recommendations Diet recommendations: Dysphagia 1 (puree);Thin liquid Liquids provided via: Cup Medication Administration: Crushed with puree Supervision: Patient able to self feed;Full supervision/cueing for compensatory strategies Compensations: Slow rate;Small sips/bites;Check for pocketing;Multiple dry swallows after each bite/sip Postural Changes and/or Swallow Maneuvers: Seated upright 90 degrees              General recommendations: Rehab consult Oral Care Recommendations: Oral care before and after PO Follow up Recommendations: Inpatient Rehab Plan: Continue with current plan of care    Shadeland Barnsdall, CCC-SLP [phone]   Joshua Horton 11/19/2013, 3:50 PM

## 2013-11-19 NOTE — Progress Notes (Signed)
  Echocardiogram Echocardiogram Transesophageal has been performed.  Philipp Deputy 11/19/2013, 3:28 PM

## 2013-11-19 NOTE — Progress Notes (Signed)
Pt at procedure, unavailable for OT eval.  Will continue to follow. 11/19/2013 Nestor Lewandowsky, OTR/L Pager: [phone]

## 2013-11-19 NOTE — Progress Notes (Signed)
Physical Therapy Treatment Patient Details Name: Joshua Horton MRN: [id number] DOB: Jul 19, 1946 Today's Date: 11/19/2013 Time: 3382-5053 PT Time Calculation (min): 26 min  PT Assessment / Plan / Recommendation  History of Present Illness Joshua Horton is an 68 y.o. male who was at home with his wife this evening 11/15/2013. While speaking with his wife had acute onset difficulty with speech. Then noted weakness on his left side as well. EMS was called at that time and the patient was brought in as a code stroke. CBG 118. Initial NIHSS of 5.  Patient given TPA.   PT Comments   Pt with improved L UE/LE mvmt however con't to have significant L UE weakness. Pt with improved ambulation tolerance but con't to be at significant falls risk. Pt remains appropriate for CIR upon d/c.  Follow Up Recommendations  CIR     Does the patient have the potential to tolerate intense rehabilitation     Barriers to Discharge        Equipment Recommendations       Recommendations for Other Services Rehab consult  Frequency Min 4X/week   Progress towards PT Goals Progress towards PT goals: Progressing toward goals  Plan Current plan remains appropriate    Precautions / Restrictions Precautions Precautions: Fall Restrictions Weight Bearing Restrictions: No   Pertinent Vitals/Pain Denies pain    Mobility  Bed Mobility Bed Mobility: Supine to Sit;Sitting - Scoot to Edge of Bed Supine to Sit: 2: Max assist;HOB flat;With rails Sitting - Scoot to Edge of Bed: 2: Max assist Details for Bed Mobility Assistance: Verbal cues for technique.   Transfers Transfers: Sit to Stand;Stand to Sit;Stand Pivot Transfers Sit to Stand: 3: Mod assist;With upper extremity assist;From bed Stand to Sit: 3: Mod assist;To bed Details for Transfer Assistance: v/c's for hand placement, assist to place L hand in optimal supportive position Ambulation/Gait Ambulation/Gait Assistance: 3: Mod assist (+1 for chair follow/IV  pole management) Ambulation Distance (Feet): 120 Feet Assistive device: 1 person hand held assist Ambulation/Gait Assistance Details: Pt with noted L LE weakness towards end of ambulation as demo'd by L knee intability and mild buckling Gait Pattern: Step-through pattern;Decreased stride length;Decreased step length - left;Decreased stance time - left;Antalgic;Narrow base of support Gait velocity: slow Stairs: No Modified Rankin (Stroke Patients Only) Pre-Morbid Rankin Score: No significant disability Modified Rankin: Moderately severe disability    Exercises     PT Diagnosis:    PT Problem List:   PT Treatment Interventions:     PT Goals (current goals can now be found in the care plan section)    Visit Information  Last PT Received On: 11/19/13 Assistance Needed: +2 History of Present Illness: Joshua Horton is an 68 y.o. male who was at home with his wife this evening 11/15/2013. While speaking with his wife had acute onset difficulty with speech. Then noted weakness on his left side as well. EMS was called at that time and the patient was brought in as a code stroke. CBG 118. Initial NIHSS of 5.  Patient given TPA.    Subjective Data      Cognition  Cognition Arousal/Alertness: Awake/alert Behavior During Therapy: WFL for tasks assessed/performed Overall Cognitive Status: Within Functional Limits for tasks assessed    Balance  Static Sitting Balance Static Sitting - Balance Support: No upper extremity supported;Feet supported Static Sitting - Level of Assistance: 5: Stand by assistance Static Sitting - Comment/# of Minutes: 3 min Static Standing Balance Static Standing - Balance  Support: Right upper extremity supported Static Standing - Level of Assistance: 3: Mod assist Static Standing - Comment/# of Minutes: marched in place x 10 reps, had an episode of L Knee buckling intitially  End of Session PT - End of Session Equipment Utilized During Treatment: Gait  belt Activity Tolerance: Patient limited by fatigue Patient left: in chair;with call bell/phone within reach;with family/visitor present Nurse Communication: Mobility status   GP     Kingsley Callander 11/19/2013, 10:30 AM  Kittie Plater, PT, DPT Pager #: [phone] Office #: [phone]

## 2013-11-19 NOTE — Interval H&P Note (Signed)
History and Physical Interval Note:  11/19/2013 2:15 PM  Joshua Horton  has presented today for surgery, with the diagnosis of stroke  The various methods of treatment have been discussed with the patient and family. After consideration of risks, benefits and other options for treatment, the patient has consented to  Procedure(s): TRANSESOPHAGEAL ECHOCARDIOGRAM (TEE) (N/A) as a surgical intervention .  The patient's history has been reviewed, patient examined, no change in status, stable for surgery.  I have reviewed the patient's chart and labs.  Questions were answered to the patient's satisfaction.     Jenkins Rouge

## 2013-11-20 ENCOUNTER — Encounter (HOSPITAL_COMMUNITY): Payer: Self-pay | Admitting: Cardiovascular Disease

## 2013-11-20 DIAGNOSIS — I6350 Cerebral infarction due to unspecified occlusion or stenosis of unspecified cerebral artery: Secondary | ICD-10-CM | POA: Diagnosis not present

## 2013-11-20 MED ORDER — PANTOPRAZOLE SODIUM 40 MG PO TBEC
40.0000 mg | DELAYED_RELEASE_TABLET | Freq: Every day | ORAL | Status: DC
  Administered 2013-11-20 – 2013-11-21 (×2): 40 mg via ORAL
  Filled 2013-11-20 (×2): qty 1

## 2013-11-20 NOTE — Progress Notes (Signed)
Speech Language Pathology Treatment: Dysphagia  Patient Details Name: Joshua Horton MRN: [id number] DOB: 02-25-1946 Today's Date: 11/20/2013 Time: 4097-3532 SLP Time Calculation (min): 42 min  Assessment / Plan / Recommendation Clinical Impression  Prior to meal, pt recalled safe swallow strategies to demonstrate understanding. Pt observed with puree meal, pt consumed small bites independently, however required verbal cues for multiple swallows per safe swallowing strategies. Upon checking for pocketing of bolus, clinician discovered a pill pocketed under pt's tongue, pt stated it was a pill that could not be crushed. Removed pill and placed in puree then clinician placed pill in puree at base of pt's tongue, pt swallowed pill with no evidence of difficulty and checked oral cavity to ensure transit of pill. Pt with no overt s/s of aspiration with observed po intake, appears to tolerate current diet and recommend to continue diet with trials of mech soft solids. RN informed of pill and measures taken to re-administer pill.    HPI HPI: Joshua Horton is a 68 y.o. male presenting with difficulty with speech, left sided weakness. S/P IV t-PA 11/16/2013 at 0058. MRI shows Scattered acute right MCA infarcts, most confluent at the posterior insula; Superimposed right occipital lobe hemorrhagic infarction with focal hematoma encompassing 30 x 19 x 13 mm. Superimposed small volume of subarachnoid hemorrhage in the right hemisphere.   Pertinent Vitals No pain.  SLP Plan  Continue with current plan of care    Recommendations Diet recommendations: Dysphagia 1 (puree);Thin liquid Liquids provided via: Cup Medication Administration: Crushed with puree Supervision: Patient able to self feed;Full supervision/cueing for compensatory strategies Compensations: Slow rate;Small sips/bites;Check for pocketing;Multiple dry swallows after each bite/sip Postural Changes and/or Swallow Maneuvers: Seated upright 90  degrees              General recommendations: Rehab consult Oral Care Recommendations: Oral care before and after PO Follow up Recommendations: Inpatient Rehab Plan: Continue with current plan of care    GO     Carola Frost MA [place] 11/20/2013, 6:38 PM

## 2013-11-20 NOTE — PMR Pre-admission (Signed)
PMR Admission Coordinator Pre-Admission Assessment  Patient: Joshua Horton is an 68 y.o., male MRN: [id number] DOB: Dec 23, 1945 Height: 5\' 10"  (177.8 cm) Weight: 101.1 kg (222 lb 14.2 oz)              Insurance Information HMO:  Yes    PPO:        PCP:       IPA:       80/20:       OTHER:   PRIMARY: [employer] medicare complete plan 2     Policy#: [id number]      Subscriber: [name] CM Name: [name]       Phone#:  [phone]       Fax#:   Pre-Cert#: [id number]       Employer: Retired Benefits:  Phone #: [phone]      Name: [name]. Date: 11/15/13     Deduct: $0     Out of Pocket Max: [phone]      Life Max: unlimited CIR:  $430 days 1-4              SNF: $0 days 1-20, $155 days 21-64, $0 days 65-100 Outpatient: w/medical necessity   Co-Pay:  $ 40/visit Home Health: 100%              Co-Pay: None DME: 80%              Co-Pay: 20% Providers: patient's choice   Emergency Contact Information Contact Information   Name Relation Home Work Mobile   Gunnels,[name] [phone]       Current Medical History  Patient Admitting Diagnosis:  Right MCA infarct/Occipital hematoma   History of Present Illness: A 68 y.o. Left handed male with history of CAD, HTN, COPD; who was admitted on 11/16/13 with acute onset of speech difficulty and left sided weakness. Patient reported similar transient symptoms on 11/03/13. CT head without acute changes. Treated with tPA. MRI/MRA brain with scattered acute right MCA infarcts, most confluent at the posterior insula superimposed rigth occipital lobe hemorrhagic infarct with focal hematoma 30 X 19 X 13 mm, trace SAH over posterior right hemisphere, R MCA M2 segment occlusion/stenosis and suspicion of plaque or thrombus supraclinoid R- ICA causing mild to moderate stenosis. Carotid dopplers with 40-59% R--ICA stenosis and 1-39% L-ICA stenosis. 2D echo with EF 65-70% with grade 1 diastolic dysfunction and no wall abnormality.  MBS done on 01/03  showing multifactorial dysphagia with delayed transit and moderate residue therefore placed on D1, thin liquids. 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 TEE done revealing EF 65% and negative for ASD, PFO or LAA thrombus. Loop recorder placed by Dr. Caryl Comes on 11/19/13. No blood thinner for now--neurology recommends repeat CCT in a week to decide on resuming low dose AS and cleared to use lovenox for DVT prophylaxis. Therapies ongoing and rehab team recommended CIR for progression.     Total: 6=NIH  Past Medical History  Past Medical History  Diagnosis Date  . Coronary artery disease   . Wheezing   . Chronic cough   . Peripheral vascular disease   . Hypertension   . Hyperlipidemia   . Reflux   . COPD (chronic obstructive pulmonary disease)   . Myocardial infarction   . CHF (congestive heart failure)     Family History  family history includes Arthritis in an other family member; Cancer in his mother and another family member; Deep vein thrombosis in his  father; Heart attack in his father; Heart disease in his father and another family member; Hyperlipidemia in his father; Hypertension in his father; Kidney disease in an other family member; Peripheral vascular disease in his father.  Prior Rehab/Hospitalizations:  None   Current Medications  Current facility-administered medications:acetaminophen (TYLENOL) suppository 650 mg, 650 mg, Rectal, Q4H PRN, [name], MD;  acetaminophen (TYLENOL) tablet 650 mg, 650 mg, Oral, Q4H PRN, [name], MD;  amLODipine (NORVASC) tablet 5 mg, 5 mg, Oral, Daily, Hulen Luster, DO, 5 mg at 11/21/13 [id number];  atorvastatin (LIPITOR) tablet 20 mg, 20 mg, Oral, q1800, Garvin Fila, MD, 20 mg at 11/20/13 1745 labetalol (NORMODYNE,TRANDATE) injection 10 mg, 10 mg, Intravenous, Q10 min PRN, [name], MD;  pantoprazole (PROTONIX) EC tablet 40 mg, 40 mg, Oral, Daily, Garvin Fila, MD, 40 mg at  11/21/13 [id number];  senna-docusate (Senokot-S) tablet 1 tablet, 1 tablet, Oral, QHS PRN, [name], MD  Patients Current Diet: Dysphagia  Precautions / Restrictions Precautions Precautions: Fall Precaution Comments: risk for subluxation and injury to left hand due to decr ROM and sensation Restrictions Weight Bearing Restrictions: No   Prior Activity Level Community (5-7x/wk): Went out daily.  Was driving.  Home Assistive Devices / Equipment Home Assistive Devices/Equipment: None Home Equipment: None  Prior Functional Level Prior Function Level of Independence: Independent Comments: Drives  Current Functional Level Cognition  Arousal/Alertness: Awake/alert Overall Cognitive Status: Impaired/Different from baseline Orientation Level: Oriented X4 Attention: Divided Safety/Judgment: Appears intact    Extremity Assessment (includes Sensation/Coordination)          ADLs  Grooming: Minimal assistance;Teeth care Where Assessed - Grooming: Unsupported standing (poor recall of sequence using bil UE needs reeducation) Toilet Transfer: Minimal assistance Toilet Transfer Method: Sit to stand Toilet Transfer Equipment: Raised toilet seat with arms (or 3-in-1 over toilet) (requires BIL UE and (A) for LT UE on arm rest) Equipment Used: Gait belt Transfers/Ambulation Related to ADLs: Pt completed sit<>stand from chair transfer. pt requires LT UE (A) to position and provide weight bearing. pt with LT UE positioned on sink for weight bearing during grooming ADL Comments: Pt with no recall of therapist from previous session. Pt reports to wife "she made me do work. " Pt pushed to describe task. Pt states "she had me doing face exercises" Pt reeducated on OT session and using teach back together re discussed positioning Lt uE  for edema managment and importance of weight bearing. Wife reports "he did tell me when i was helping in the bed. Don't pull on that arm." Pt required cues to problem  solve placement of tooth brush in Lt hand with weight bearing on sink to apply paste with RT UE. pt educated on static standing weight bearing shoulder protraction/ retraction exercises usinig baby powder and wash cloth. Pt also completed circumduction with shoulder and abduction/adduction. Pt with wrist flexion and extension during task to help with edema and PROM. Pt returned to sitting in recliner at end of session. Pt educated on supination / pronation exercises x3 per day with wife present. Pt remains with no AROM of wrist.     Mobility  Bed Mobility: Not assessed Rolling Left: 4: Min guard;With rail Left Sidelying to Sit: 3: Mod assist;HOB elevated Supine to Sit: 4: Min guard;With rails Sitting - Scoot to Edge of Bed: 2: Max assist    Transfers  Transfers: Sit to Stand;Stand to [employer] Transfers Sit to Stand: 4: Min assist;With upper extremity assist;From chair/3-in-1 Sit to Stand:  Patient Percentage: 60% Stand to Sit: 4: Min assist;With upper extremity assist;To chair/3-in-1 Stand to Sit: Patient Percentage: 60% Stand Pivot Transfers: 1: +2 Total assist Stand Pivot Transfers: Patient Percentage: 60%    Ambulation / Gait / Stairs / Wheelchair Mobility  Ambulation/Gait Ambulation/Gait Assistance: 3: Mod assist Ambulation Distance (Feet): 200 Feet Assistive device: 1 person hand held assist Ambulation/Gait Assistance Details: Patient with seated rest break. Patient with what appeared to be better clearance of his L foot (per patient). Slightly off balance to L at time but overall great progress and motivation Gait Pattern: Step-through pattern;Decreased stride length;Decreased step length - left;Decreased stance time - left;Antalgic;Narrow base of support Gait velocity: Patient able to practice with and without HHA this session. Worked on dynamic gait activities with Min A for staggering and to maintain balance with dynamic challenges. Patient tends to increased sway and has less  LLE stability with challenges Stairs: No    Posture / Balance Static Sitting Balance Static Sitting - Balance Support: No upper extremity supported;Feet supported Static Sitting - Level of Assistance: 5: Stand by assistance Static Sitting - Comment/# of Minutes: Worked on PNF flexion/extension with assist while sitting EOB Static Standing Balance Static Standing - Balance Support: Right upper extremity supported Static Standing - Level of Assistance: 4: Min assist Static Standing - Comment/# of Minutes: marched in place x 10 reps, had an episode of L Knee buckling intitially High Level Balance High Level Balance Comments: pt with LOb to the left in bathroom static standing. pt states "I am leaning on it" Pt with decr awareness of drift to the left    Special needs/care consideration BiPAP/CPAP No CPM No Continuous Drip IV No Dialysis No         Life Vest No Oxygen No, but patient reports that he chokes at night and it wakes him up Special Bed No Trach Size No Wound Vac (area) No       Skin Has a red, dry scaly skin on face                               Bowel mgmt: Had BM 11/19/13 Bladder mgmt: Voiding in urinal Diabetic mgmt No    Previous Home Environment Living Arrangements: Spouse/significant other  Lives With: Spouse Available Help at Discharge: Family;Available 24 hours/day Type of Home: House Home Layout: One level Home Access: Stairs to enter Entrance Stairs-Rails: Psychiatric nurse of Steps: 2 Bathroom Shower/Tub: Walk-in Sales promotion account executive: Handicapped height Home Care Services: No  Discharge Living Setting Plans for Discharge Living Setting: Patient's home;House;Lives with (comment) (Lives with wife.  ) Type of Home at Discharge: House Discharge Home Layout: Two level;Able to live on main level with bedroom/bathroom Alternate Level Stairs-Number of Steps: Flight (Does not have to go upstairs.) Discharge Home Access: Stairs to  enter Entrance Stairs-Number of Steps: 2 at back entry and 5-6 at front entry Does the patient have any problems obtaining your medications?: No  Social/Family/Support Systems Patient Roles: Spouse;Parent Contact Information: [facility] - spouse Anticipated Caregiver: [facility] - wife Anticipated Caregiver's Contact Information: Lavetta Nielsen [phone] Ability/Limitations of Caregiver: Wife does not work and can provide supervision at home Caregiver Availability: 24/7 Discharge Plan Discussed with Primary Caregiver: Yes Is Caregiver In Agreement with Plan?: Yes Does Caregiver/Family have Issues with Lodging/Transportation while Pt is in Rehab?: No  Goals/Additional Needs Patient/Family Goal for Rehab: PT mod I to S, OT S to  min A, ST supervision goals Expected length of stay: 13-18 days Cultural Considerations: None Dietary Needs: Dys 1, thin liquids Equipment Needs: TBD Pt/Family Agrees to Admission and willing to participate: Yes Program Orientation Provided & Reviewed with Pt/Caregiver Including Roles  & Responsibilities: Yes  Decrease burden of Care through IP rehab admission:  N/A  Possible need for SNF placement upon discharge: Not anticipated  Patient Condition: This patient's medical and functional status has changed since the consult dated: 11/19/13 in which the Rehabilitation Physician determined and documented that the patient's condition is appropriate for intensive rehabilitative care in an inpatient rehabilitation facility. See "History of Present Illness" (above) for medical update. Functional changes are:  Currently ambulating 200 ft min A with and without HHA. Patient's medical and functional status update has been discussed with the Rehabilitation physician and patient remains appropriate for inpatient rehabilitation. Will admit to inpatient rehab today.  Preadmission Screen Completed By:  Retta Diones, 11/21/2013 3:01  PM ______________________________________________________________________   Discussed status with Dr. Naaman Plummer on 11/21/13 at 1521 and received telephone approval for admission today.  Admission Coordinator:  Retta Diones, time1521/Date1/7/15

## 2013-11-20 NOTE — Evaluation (Signed)
Occupational Therapy Evaluation Patient Details Name: Joshua Horton MRN: [id number] DOB: [birth year] Today's Date: 11/20/2013 Time: 9735-3299 OT Time Calculation (min): 49 min  OT Assessment / Plan / Recommendation History of present illness Joshua Horton is an 68 y.o. male who was at home with his wife this evening 11/15/2013. While speaking with his wife had acute onset difficulty with speech. Then noted weakness on his left side as well. EMS was called at that time and the patient was brought in as a code stroke. CBG 118. Initial NIHSS of 5.  Patient given TPA.   Clinical Impression   PT admitted with difficulty with speech. Pt currently with functional limitiations due to the deficits listed below (see OT problem list). MRI (+) Acute Rt coronal radiata infarct Pt will benefit from skilled OT to increase their independence and safety with adls and balance to allow discharge CIR. New onset of left hemiplegia.      OT Assessment  Patient needs continued OT Services    Follow Up Recommendations  CIR    Barriers to Discharge      Equipment Recommendations  Other (comment) (defer to CIR)    Recommendations for Other Services Rehab consult  Frequency  Min 3X/week    Precautions / Restrictions Precautions Precautions: Fall   Pertinent Vitals/Pain None reported     ADL  Grooming: Wash/dry face;Wash/dry hands;Teeth care;Minimal assistance (educated on using LT UE for support and RT UE to open) Where Assessed - Grooming: Supported standing Equipment Used: Gait belt Transfers/Ambulation Related to ADLs: Pt ambulating to bathroom MIN A with left lean. Pt hitting door frame withi LT UE and unaware. Pt with sensation changes in LT UE ADL Comments: Pt supine on arrival. Pt requesting to void bladder and wanting to complete task supine with urinal. Pt with towel placed by OT to assess spillage. Pt with spillage on towel. Pt advised on peri hygiene and decr risk of UTI with daily  bathing. Pt completing supine <>sit with pulling on bed rail. pt static sitting min guard (A)> Pt with LT UE hanging in dependent position. Visual assessment complete with LT end range nystagmus noted. pt completed grooming task. Pt requesting bed level at end of session and transfered to chair instead. Pt verbalized bed but completed chair transfer. Question cognition for executive functioning deficits. Pt likes to joke and during session advised to be serious in responses to therapist so we can truely tell what has changed verse joking. Pt states "okay" Pt with poor left lift closure. Pt reports trouble with meals is due to lip closure not grasp of Rt UE on utensils. Pt is left hand dominant so using rt hand is awkward. Pt demonstrates shoulder flexion abduction, and elbow.     OT Diagnosis: Generalized weakness;Hemiplegia dominant side  OT Problem List: Decreased strength;Decreased range of motion;Decreased activity tolerance;Impaired balance (sitting and/or standing);Decreased safety awareness;Decreased coordination;Decreased knowledge of use of DME or AE;Decreased knowledge of precautions;Impaired UE functional use OT Treatment Interventions: Self-care/ADL training;Therapeutic exercise;Neuromuscular education;DME and/or AE instruction;Therapeutic activities;Patient/family education;Balance training   OT Goals(Current goals can be found in the care plan section) Acute Rehab OT Goals Patient Stated Goal: to go home  OT Goal Formulation: With patient Time For Goal Achievement: 12/04/13 Potential to Achieve Goals: Good  Visit Information  Last OT Received On: 11/20/13 Assistance Needed: +1 History of Present Illness: Joshua Horton is an 68 y.o. male who was at home with his wife this evening 11/15/2013. While speaking with his wife  had acute onset difficulty with speech. Then noted weakness on his left side as well. EMS was called at that time and the patient was brought in as a code stroke. CBG  118. Initial NIHSS of 5.  Patient given TPA.       Prior [place] expects to be discharged to:: Inpatient rehab Living Arrangements: Spouse/significant other Available Help at Discharge: Family;Available 24 hours/day Type of Home: House Home Access: Stairs to enter [employer] of Steps: 2 Entrance Stairs-Rails: Right;Left Home Layout: One level Home Equipment: None Prior Function Level of Independence: Independent Comments: Drives Communication Communication: Expressive difficulties Dominant Hand: Left         Vision/Perception Vision - Assessment Vision Assessment: Vision tested Ocular Range of Motion: Within Functional Limits Tracking/Visual Pursuits: Impaired - to be further tested in functional context Additional Comments: nystagmus noted to the left peripherial field   Cognition  Cognition Arousal/Alertness: Awake/alert Behavior During Therapy: WFL for tasks assessed/performed Overall Cognitive Status: Within Functional Limits for tasks assessed    Extremity/Trunk Assessment Upper Extremity Assessment Upper Extremity Assessment: LUE deficits/detail LUE Deficits / Details: shoulder flexion and abduction 3 - out 5 and elbow flexion 3- out 5. Pt with abscent wrist and digit AROM LUE Sensation: decreased light touch LUE Coordination: decreased fine motor;decreased gross motor Lower Extremity Assessment Lower Extremity Assessment: Defer to PT evaluation     Mobility Bed Mobility Bed Mobility: Supine to Sit;Sitting - Scoot to Edge of Bed Rolling Left: 4: Min guard;With rail Supine to Sit: 4: Min guard;With rails Details for Bed Mobility Assistance: pt using RT side of body to progress to standing Transfers Sit to Stand: 4: Min assist;From bed Stand to Sit: 4: Min assist;To chair/3-in-1 Details for Transfer Assistance: v/c to control descend to chair     Exercise     Balance Static Sitting Balance Static  Sitting - Comment/# of Minutes: Worked on PNF flexion/extension with assist while sitting EOB Static Standing Balance Static Standing - Balance Support: Right upper extremity supported Static Standing - Level of Assistance: 4: Min assist High Level Balance High Level Balance Comments: pt with LOb to the left in bathroom static standing. pt states "I am leaning on it" Pt with decr awareness of drift to the left   End of Session OT - End of Session Activity Tolerance: Patient tolerated treatment well Patient left: in chair;with call bell/phone within reach;with family/visitor present (family advised to notified RN if leaving) Nurse Communication: Mobility status;Precautions  GO     Peri Maris 11/20/2013, 3:49 PM Pager: [phone]

## 2013-11-20 NOTE — H&P (Signed)
Physical Medicine and Rehabilitation Admission H&P    Chief Complaint  Patient presents with  . Left sided weakness and slurred speech.     HPI: Joshua Horton is a 67 y.o. Left handed male with history of CAD, HTN, COPD; who was admitted on 11/16/13 with acute onset of speech difficulty and left sided weakness. Patient reported similar transient symptoms on 11/03/13. CT head without acute changes. Treated with tPA. MRI/MRA brain with scattered acute right MCA infarcts, most confluent at the posterior insula superimposed rigth occipital lobe hemorrhagic infarct with focal hematoma 30 X 19 X 13 mm, trace SAH over posterior right hemisphere, R MCA M2 segment occlusion/stenosis and suspicion of plaque or thrombus supraclinoid R- ICA causing mild to moderate stenosis. Carotid dopplers with 40-59% R--ICA stenosis and 1-39% L-ICA stenosis. 2D echo with EF 65-70% with grade 1 diastolic dysfunction and no wall abnormality.   MBS done on 01/03 showing multifactorial dysphagia with delayed transit and moderate residue therefore placed on D1, thin liquids. 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 TEE done revealing EF 65% and negative for ASD, PFO or LAA thrombus. Loop recorder placed by Dr. Caryl Comes on 11/19/13. NO blood thinner for now--neurology recommends repeat CCT in a week to decide on resuming low dose AS and cleared to use lovenox for DVT prophylaxis.  Therapies ongoing and rehab team recommended CIR for progression. Pt was ultimately admitted today.    Review of Systems  HENT: Positive for hearing loss.   Respiratory: Negative for cough, shortness of breath and wheezing.   Cardiovascular: Negative for chest pain and palpitations.  Gastrointestinal: Positive for heartburn (hiccups after meals. ). Negative for nausea and vomiting.  Genitourinary: Negative for urgency and frequency.  Musculoskeletal: Positive for joint pain (Chronic R>L hip pain. L-RTC  dysfunction--has declined surgery.).  Neurological: Positive for speech change, focal weakness and headaches. Negative for dizziness and tingling.  occasionally getting choked up on solid foods.   Past Medical History  Diagnosis Date  . Coronary artery disease   . Wheezing   . Chronic cough   . Peripheral vascular disease   . Hypertension   . Hyperlipidemia   . Reflux   . COPD (chronic obstructive pulmonary disease)   . Myocardial infarction   . CHF (congestive heart failure)     Past Surgical History  Procedure Laterality Date  . Coronary angioplasty with stent placement  2003  . Cervical disc surgery      Dr Vertell Limber  . Tee without cardioversion N/A 11/19/2013    Procedure: TRANSESOPHAGEAL ECHOCARDIOGRAM (TEE);  Surgeon: Josue Hector, MD;  Location: [facility] ENDOSCOPY;  Service: Cardiovascular;  Laterality: N/A;    Family History  Problem Relation Age of Onset  . Heart disease    . Arthritis    . Cancer    . Kidney disease    . Cancer Mother   . Deep vein thrombosis Father   . Heart disease Father   . Hyperlipidemia Father   . Hypertension Father   . Heart attack Father   . Peripheral vascular disease Father     Social History: Married. Retired from [employer]. He reports that he quit smoking about 7 months ago. His smoking use included Cigarettes. He smoked 1.00 pack per day. He has never used smokeless tobacco. He reports that he does not drink alcohol or use illicit drugs.    Allergies  Allergen Reactions  . Ivp Dye [Iodinated Diagnostic Agents]  Medications Prior to Admission  Medication Sig Dispense Refill  . nitroGLYCERIN (NITROSTAT) 0.4 MG SL tablet Place 0.4 mg under the tongue every 5 (five) minutes as needed for chest pain.      Marland Kitchen omeprazole (PRILOSEC) 20 MG capsule Take 20 mg by mouth daily.      . simvastatin (ZOCOR) 40 MG tablet Take 40 mg by mouth every evening.      . [DISCONTINUED] aspirin 81 MG tablet Take 81 mg by mouth daily.      .  [DISCONTINUED] lisinopril-hydrochlorothiazide (PRINZIDE,ZESTORETIC) 10-12.5 MG per tablet Take 1 tablet by mouth daily.        Home: Home Living Family/patient expects to be discharged to:: Inpatient rehab Living Arrangements: Spouse/significant other Available Help at Discharge: Family;Available 24 hours/day Type of Home: House Home Access: Stairs to enter [employer] of Steps: 2 Entrance Stairs-Rails: Right;Left Home Layout: One level Home Equipment: None   Functional History: Prior Function Comments: Drives  Functional Status:  Mobility: Bed Mobility Bed Mobility: Supine to Sit;Sitting - Scoot to Edge of Bed Rolling Left: 3: Mod assist;With rail Left Sidelying to Sit: 3: Mod assist;HOB elevated Supine to Sit: 2: Max assist;HOB flat;With rails Sitting - Scoot to Edge of Bed: 2: Max assist Transfers Transfers: Sit to Stand;Stand to [employer] Transfers Sit to Stand: 3: Mod assist;With upper extremity assist;From bed Sit to Stand: Patient Percentage: 60% Stand to Sit: 3: Mod assist;To bed Stand to Sit: Patient Percentage: 60% Stand Pivot Transfers: 1: +2 Total assist Stand Pivot Transfers: Patient Percentage: 60% Ambulation/Gait Ambulation/Gait Assistance: 3: Mod assist (+1 for chair follow/IV pole management) Ambulation Distance (Feet): 120 Feet Assistive device: 1 person hand held assist Ambulation/Gait Assistance Details: Pt with noted L LE weakness towards end of ambulation as demo'd by L knee intability and mild buckling Gait Pattern: Step-through pattern;Decreased stride length;Decreased step length - left;Decreased stance time - left;Antalgic;Narrow base of support Gait velocity: slow Stairs: No    ADL:    Cognition: Cognition Overall Cognitive Status: Within Functional Limits for tasks assessed Orientation Level: Oriented X4 Cognition Arousal/Alertness: Awake/alert Behavior During Therapy: WFL for tasks assessed/performed Overall  Cognitive Status: Within Functional Limits for tasks assessed  Physical Exam: Blood pressure 149/70, pulse 89, temperature 97.5 F (36.4 C), temperature source Oral, resp. rate 24, height 5\' 10"  (1.778 m), weight 101.1 kg (222 lb 14.2 oz), SpO2 93.00%. Physical Exam  Nursing note and vitals reviewed. Constitutional: He is oriented to person, place, and time. He appears well-developed and well-nourished.  HENT:  Head: Normocephalic and atraumatic.  Multiple dental caries.   Eyes: Pupils are equal, round, and reactive to light.  Neck: Normal range of motion. Neck supple. No JVD present. No tracheal deviation present. No thyromegaly present.  Cardiovascular: Normal rate and regular rhythm.  Exam reveals friction rub. Exam reveals no gallop.   No murmur heard. Respiratory: Effort normal and breath sounds normal. No respiratory distress. He has no wheezes. He has no rales.  GI: Soft. Bowel sounds are normal. He exhibits no distension. There is no tenderness. There is no rebound.  Musculoskeletal: Normal range of motion. He exhibits no edema and no tenderness.  Lymphadenopathy:    He has no cervical adenopathy.  Neurological: He is alert and oriented to person, place, and time.  Moderate to severe dysarthria with left facial droop--needs cues to slow rate of speech. Left tongue deviation.  Follows basic commands without difficulty.  Sensation 1+/2 left face,arm, leg. LUE is 2+ to 3- deltoid,  bicep, tricep while wrist and HI are 0/5. LLE is 4- HF, 4 KE and 4+ at ankle. DTR's are 1+ bilaterally. Seems to have reasonable insight and awareness   Skin: Skin is warm and dry.  Dry flaky skin in  hair line--beard as well as scalp in addition to extremities   Psychiatric: He has a normal mood and affect. His behavior is normal.    No results found for this or any previous visit (from the past 48 hour(s)). No results found.  Post Admission Physician Evaluation: 1. Functional deficits secondary  to  embolic right MCA infarcts of unclear source. 2. Patient is admitted to receive collaborative, interdisciplinary care between the physiatrist, rehab nursing staff, and therapy team. 3. Patient's level of medical complexity and substantial therapy needs in context of that medical necessity cannot be provided at a lesser intensity of care such as a SNF. 4. Patient has experienced substantial functional loss from his/her baseline which was documented above under the "Functional History" and "Functional Status" headings.  Judging by the patient's diagnosis, physical exam, and functional history, the patient has potential for functional progress which will result in measurable gains while on inpatient rehab.  These gains will be of substantial and practical use upon discharge  in facilitating mobility and self-care at the household level. 5. Physiatrist will provide 24 hour management of medical needs as well as oversight of the therapy plan/treatment and provide guidance as appropriate regarding the interaction of the two. 6. 24 hour rehab nursing will assist with bladder management, bowel management, safety, skin/wound care, disease management, medication administration, pain management and patient education  and help integrate therapy concepts, techniques,education, etc. 7. PT will assess and treat for/with: Lower extremity strength, range of motion, stamina, balance, functional mobility, safety, adaptive techniques and equipment, NMR, education.   Goals are: mod I for locomotion and basic mobility. 8. OT will assess and treat for/with: ADL's, functional mobility, safety, upper extremity strength, adaptive techniques and equipment, NMR, ?splinting and adaptive device, education.   Goals are: mod I to min assist. 9. SLP will assess and treat for/with: speech intelligibility, swallowing, communication, education.  Goals are: mod i to supervision. 10. Case Management and Social Worker will assess and treat for  psychological issues and discharge planning. 11. Team conference will be held weekly to assess progress toward goals and to determine barriers to discharge. 12. Patient will receive at least 3 hours of therapy per day at least 5 days per week. 13. ELOS: 10-12 days       14. Prognosis:  excellent   Medical Problem List and Plan: R-MCA infarcts with hemorrhagic conversion past tPA--embolic due to unknown sourcel 1. DVT Prophylaxis/Anticoagulation: Mechanical: Sequential compression devices, below knee Bilateral lower extremities Pharmaceutical: Lovenox 2. DJD/Pain Management:  Prn tylenol. Will add Voltaren gel additionally to help with symptoms.  3. Mood:  Noted to  have mild underlying anxiety. Team to provide ego support. LCSW to follow for support and evaluation.  4. Neuropsych: This patient is capable of making decisions on his own behalf. 5. HTN: Will monitor BP with bid checks. Continue Norvasc.  Prinizide on hold to avoid hypotension.  Resume as indicated.  6. Dyslipidemia: On lipitor daily 7. Dysphagia: D1, thin diet---needs to chew well and take time with swallowing   Meredith Staggers, MD, [facility]   11/20/2013

## 2013-11-20 NOTE — Progress Notes (Signed)
Rehab admissions - I spoke with wife and she has just informed me that patient has [employer].  I will need OT eval done today if possible.  Then I can get authorization for acute inpatient rehab admission from [place].  I will hold on admit today and follow up tomorrow after I hear back from [place].  Call me for questions.  #[phone]

## 2013-11-20 NOTE — Evaluation (Signed)
Speech Language Pathology Evaluation Patient Details Name: Joshua Horton MRN: [id number] DOB: [birth year] Today's Date: 11/20/2013 Time: 4742-5956 SLP Time Calculation (min): 25 min  Problem List:  Patient Active Problem List   Diagnosis Date Noted  . Unspecified cerebral artery occlusion with cerebral infarction 11/16/2013  . COPD (chronic obstructive pulmonary disease) 08/27/2013  . CAD S/P percutaneous coronary angioplasty 07/26/2013  . HTN (hypertension) 07/26/2013  . Hyperlipemia 07/26/2013  . History of tobacco use in home 07/26/2013  . Rotator cuff strain 10/19/2012  . Atherosclerosis of native arteries of the extremities with intermittent claudication 08/23/2012   Past Medical History:  Past Medical History  Diagnosis Date  . Coronary artery disease   . Wheezing   . Chronic cough   . Peripheral vascular disease   . Hypertension   . Hyperlipidemia   . Reflux   . COPD (chronic obstructive pulmonary disease)   . Myocardial infarction   . CHF (congestive heart failure)    Past Surgical History:  Past Surgical History  Procedure Laterality Date  . Coronary angioplasty with stent placement  2003  . Cervical disc surgery      Dr Vertell Limber  . Tee without cardioversion N/A 11/19/2013    Procedure: TRANSESOPHAGEAL ECHOCARDIOGRAM (TEE);  Surgeon: Josue Hector, MD;  Location: [facility] ENDOSCOPY;  Service: Cardiovascular;  Laterality: N/A;   HPI:  [name] is a 68 y.o. male presenting with difficulty with speech, left sided weakness. S/P IV t-PA 11/16/2013 at 0058. MRI shows Scattered acute right MCA infarcts, most confluent at the posterior insula; Superimposed right occipital lobe hemorrhagic infarction with focal hematoma encompassing 30 x 19 x 13 mm. Superimposed small volume of subarachnoid hemorrhage in the right hemisphere.   Assessment / Plan / Recommendation Clinical Impression  Pt presents with dysarthric speech secondary to left facial, lingual and labial weakness  following CVA. Pt with 100% intelligiblity with 1 syllable words, however with multisyllabic words and phrases pt's intelligibility decreases to approximately 75% secondary to poor articulation. In conversational speech pt approximately 60% intelligible and family reports (familiar listener) that pt is 75% intelligible. Pt with increased intelligibility from previous day per family. Reviewed strategies to improve intellibility with slow pace and over articulation of words. Recommend skilled intervention to increase pt's speech intelligibility with oral motor exercises and compensatory strategies.     SLP Assessment  Patient needs continued Speech Lanaguage Pathology Services    Follow Up Recommendations  Inpatient Rehab    Frequency and Duration min 2x/week  2 weeks   Pertinent Vitals/Pain No Pain   SLP Goals  SLP Goals Potential to Achieve Goals: Good Progress/Goals/Alternative treatment plan discussed with pt/caregiver and they: Agree  SLP Evaluation Prior Functioning  Cognitive/Linguistic Baseline: Within functional limits Type of Home: House  Lives With: Spouse Available Help at Discharge: Family;Available 24 hours/day   Cognition  Overall Cognitive Status: Within Functional Limits for tasks assessed Arousal/Alertness: Awake/alert Orientation Level: Oriented X4 Attention: Divided Safety/Judgment: Appears intact    Comprehension  Auditory Comprehension Overall Auditory Comprehension: Appears within functional limits for tasks assessed Yes/No Questions: Within Functional Limits Commands: Within Functional Limits Conversation: Complex    Expression Verbal Expression Overall Verbal Expression: Appears within functional limits for tasks assessed Initiation: No impairment Repetition: No impairment Written Expression Dominant Hand: Left   Oral / Motor Oral Motor/Sensory Function Overall Oral Motor/Sensory Function: Impaired Labial ROM: Reduced left Labial Symmetry:  Abnormal symmetry left Labial Strength: Reduced Labial Sensation: Reduced Lingual ROM: Reduced left Lingual  Symmetry: Abnormal symmetry left Lingual Strength: Reduced Lingual Sensation: Reduced Facial ROM: Reduced left Facial Symmetry: Left droop Facial Strength: Reduced Facial Sensation: Reduced Velum: Impaired left Mandible: Within Functional Limits Motor Speech Overall Motor Speech: Impaired Respiration: Within functional limits Phonation: Hoarse Articulation: Impaired Level of Impairment: Word (Multisyllabic) Intelligibility: Intelligibility reduced Word: 75-100% accurate Phrase: 50-74% accurate Sentence: 50-74% accurate Conversation: 50-74% accurate Motor Planning: Witnin functional limits Motor Speech Errors: Aware Interfering Components: Inadequate dentition Effective Techniques: Slow rate;Over-articulate   GO     Carola Frost MA CCC-SLP 11/20/2013, 6:35 PM

## 2013-11-20 NOTE — Progress Notes (Signed)
Stroke Team Progress Note  HISTORY Joshua Horton is an 68 y.o. male who was at home with his wife this evening 11/15/2013. While speaking with his wife had acute onset difficulty with speech. Then noted weakness on his left side as well. EMS was called at that time and the patient was brought in as a code stroke. CBG 118. Initial NIHSS of 5. The patient reports that he had a similar episode on 11/12/2013. His symptoms resolved completely after about 30 minutes and the patient refused transport by EMS. Medical attention was not sought.  Patient was given TPA. He was admitted to the neuro ICU  for further evaluation and treatment.  SUBJECTIVE Pt up in geri-chair. No family at bedside.  OBJECTIVE Most recent Vital Signs: Filed Vitals:   11/19/13 2110 11/20/13 0130 11/20/13 0545 11/20/13 1000  BP: 154/77 162/80 149/70 146/74  Pulse: 90 84 89 87  Temp: 98.5 F (36.9 C) 98.2 F (36.8 C) 97.5 F (36.4 C) 98 F (36.7 C)  TempSrc: Oral Oral Oral Oral  Resp: 20 22 24 20   Height:      Weight:      SpO2: 95% 95% 93% 95%   CBG (last 3)  No results found for this basename: GLUCAP,  in the last 72 hours  IV Fluid Intake:   . sodium chloride 75 mL/hr at 11/19/13 1600    MEDICATIONS  . amLODipine  5 mg Oral Daily  . atorvastatin  20 mg Oral q1800  . pantoprazole (PROTONIX) IV  40 mg Intravenous QHS   PRN:  acetaminophen, acetaminophen, labetalol, senna-docusate  Diet:  Dysphagia 1 thin liquids Activity:  OOB DVT Prophylaxis:  SCDs   CLINICALLY SIGNIFICANT STUDIES Basic Metabolic Panel:   Recent Labs Lab 11/16/13 0042 11/16/13 0049 11/18/13 0350  NA 135* 135* 140  K 4.0 3.8 3.8  CL 96 100 103  CO2 25  --  26  GLUCOSE 135* 136* 96  BUN 9 8 13   CREATININE 0.83 0.90 0.87  CALCIUM 9.2  --  9.1   Liver Function Tests:   Recent Labs Lab 11/16/13 0042  AST 30  ALT 37  ALKPHOS 105  BILITOT 0.3  PROT 7.5  ALBUMIN 3.9   CBC:   Recent Labs Lab 11/16/13 0042  11/16/13 0049 11/18/13 0350  WBC 7.4  --  6.4  NEUTROABS 3.6  --  3.5  HGB 15.8 17.0 15.3  HCT 46.5 50.0 45.9  MCV 87.6  --  88.4  PLT 216  --  211   Coagulation:   Recent Labs Lab 11/16/13 0042  LABPROT 12.2  INR 0.92   Cardiac Enzymes:   Recent Labs Lab 11/16/13 0042  TROPONINI <0.30   Urinalysis: No results found for this basename: COLORURINE, APPERANCEUR, LABSPEC, [place], GLUCOSEU, HGBUR, BILIRUBINUR, KETONESUR, PROTEINUR, UROBILINOGEN, NITRITE, LEUKOCYTESUR,  in the last 168 hours Lipid Panel    Component Value Date/Time   CHOL 169 11/16/2013 0402   TRIG 215* 11/16/2013 0402   HDL 32* 11/16/2013 0402   CHOLHDL 5.3 11/16/2013 0402   VLDL 43* 11/16/2013 0402   LDLCALC 94 11/16/2013 0402   HgbA1C  Lab Results  Component Value Date   HGBA1C 6.5* 11/16/2013    Urine Drug Screen:   No results found for this basename: labopia,  cocainscrnur,  labbenz,  amphetmu,  thcu,  labbarb    Alcohol Level:   Recent Labs Lab 11/16/13 0042  ETH <11     CT of the brain  11/18/2013    1. Normal expected interval evolution of scattered right MCA territory infarcts, most confluent within the operculum. 2. Interval decrease in size of right occipital hematoma, now measuring approximately 2.5 x 1.8 cm, previously 3.0 x 1.9 cm. Scattered subarachnoid hemorrhage is also decreased in conspicuity, likely related to redistribution. 3. No new intracranial hemorrhage or other process identified. 4. No midline shift or hydrocephalus.   11/17/2013   1. Similar size and distribution of scattered right MCA territory infarcts, most confluent within the right operculum. 2. Stable size of right occipital lobe hemorrhagic infarction with hematoma, measuring approximately 3.0 x 1.9 cm. 3. Subarachnoid hemorrhage within the posterior right temporal and right occipital lobe, similar to prior. Trace subarachnoid now visible within the right frontal and left frontal lobes, which may be related to redistribution. No  intraventricular or definite new intracranial hemorrhage. 4. No significant mass effect, midline shift, or hydrocephalus at this time. No new infarct.    11/16/2013   Focal low-attenuation change in the deep white matter on the left suggesting age-indeterminate ischemic focus. No mass effect or midline shift. No acute intracranial hemorrhage. Diffuse cerebral atrophy.    MRI of the brain  11/16/2013   1. Scattered acute right MCA infarcts, most confluent at the posterior insula. 2. Superimposed right occipital lobe hemorrhagic infarction with focal hematoma encompassing 30 x 19 x 13 mm. 3. Superimposed small volume of subarachnoid hemorrhage in the right hemisphere. 4. No intraventricular hemorrhage, ventriculomegaly, or significant mass effect at this time.   MRA of the brain  11/16/2013   Right MCA M2 segment occlusions/stenoses. Middle and posterior sylvian branches most affected. Right MCA M1 segment remains patent.  Suspect plaque or thrombus in the supraclinoid right ICA causing mild to moderate stenosis.   2D Echocardiogram  Normal LV size and vigorous systolic function, EF 40-98%. Normal RV size and systolic function. No significant valvular abnormalities.  Carotid Doppler  Findings suggest 40-59% right internal carotid artery stenosis, 1-39% left internal carotid artery stenosis. Vertebral arteries are patent with antegrade flow.  TEE No PFO negative bubble. No LAA thrombus. No source of embolus  CXR  11/16/2013    No acute cardiopulmonary disease.     EKG  normal sinus rhythm.   Therapy Recommendations CIR  Physical Exam   General Examination:  HEENT- Normocephalic, no lesions, without obvious abnormality. Normal external eye and conjunctiva. Normal external nose, mucus membranes and septum. Normal pharynx.  Neck supple with no masses, nodes, nodules or enlargement.  Cardiovascular - S1, S2 normal  Lungs - chest clear, no wheezing, rales, normal symmetric air entry  Abdomen - soft,  non-tender; bowel sounds normal; Extremities - mild edema in lower extremities bilaterally  Neurologic Examination:  Mental Status:  Alert, oriented, thought content appropriate. Speech fluent but markedly dysarthric. Able to follow 3 step commands without difficulty.  Cranial Nerves:  II: Visual fields grossly normal, pupils equal, round, reactive to light III,IV, VI: ptosis not present, extra-ocular motions intact bilaterally  V,VII: left facial droop, facial light touch sensation decreased on the left side of the face  VIII: hearing normal bilaterally  IX,X: gag reflex reduced  XI: bilateral shoulder shrug  XII: midline tongue extension  Motor:  Right : Upper extremity 5/5 Left: Upper extremity 3/5  Lower extremity 5/5 Lower extremity 5-/5  Tone and bulk:normal tone throughout; no atrophy noted  Sensory: Pinprick and light touch decreased in the left upper extremity  Deep Tendon Reflexes: 1+ and symmetric throughout with absent  AJ's bilaterally  Plantars:  Right: downgoing Left: upgoing  Cerebellar:  normal finger-to-nose on the right and normal heel-to-shin testing bilaterally. Finger to nose difficult on the left due to weakness  Gait: Unable to test  CV: pulses palpable throughout   ASSESSMENT [name] is a 68 y.o. male presenting with difficulty with speech, left sided weakness. S/P IV t-PA 11/16/2013 at 0058. Imaging shows scattered right MCA infarcts, right occipital lobe hemorrhagic infarction with focal hematoma encompassing 30 x 19 x 13 mm with small volume of right subarachnoid hemorrhage. Patient with post-tPA hemorrhagic transformation,clinically asymptomatic. Infarct felt to be embolic secondary to unknown source - TEE negative, loop placed.  On aspirin 81 mg orally every day prior to admission. Now on no antithrombotic for secondary stroke prevention given hemorrhe. Work up underway.  hypertension Hyperlipidemia, LDL 95, on zocor 40 mg daily PTA, now on no  statin, at goal LDL < 100  Hyperglycemia, HgbA1c 6.5  CAD - CABG w/ stent 2003  PVD  Carotid stenosis, R 40-59%  History of DVT  Cigarette smoker  COPD  Obesity, Body mass index is 31.98 kg/(m^2).   Hospital day # 4  TREATMENT/PLAN Transfer to rehab today Cardiology to f/u implantable loop recorder [facility] OP obstructive sleep apnea evaluation after rehab discharge Resume home BP meds as appropriate. Discussed with Algis Liming, PA, from rehab. They will address and follow BP there. CT in ~1 week to insure hmg resolution prior to resuming antithrombotic. Patient has a 10-15% risk of having another stroke over the next year, the highest risk is within 2 weeks of the most recent stroke/TIA (risk of having a stroke following a stroke or TIA is the same). Ongoing risk factor control by Primary Care Physician Follow up with Dr. Leonie Man, [facility], in 2 months.  Dr. Leonie Man D/W pt and answered questions  Burnetta Sabin, MSN, RN, ANVP-BC, ANP-BC, GNP-BC [facility] Pager: [phone] 11/20/2013 11:26 AM  I have personally obtained a history, examined the patient, evaluated imaging results, and formulated the assessment and plan of care. I agree with the above. Antony Contras, MD

## 2013-11-20 NOTE — Progress Notes (Signed)
Rehab admissions - Evaluated for possible admission.  I spoke with patient and his brother.  Patient lives with his wife and she can assist after rehab.  Patient is interested in inpatient rehab admission.  Bed available and can admit to acute inpatient rehab today.  Call me for questions.  #[phone]

## 2013-11-20 NOTE — Progress Notes (Signed)
OT NOTE  Patient with pending admission to CIR today. OT will defer evaluation to CIR OT therapist. OT to sign off acutely.   Jeri Modena   OTR/L Pager: [phone] Office: [phone] .

## 2013-11-20 NOTE — Progress Notes (Signed)
Physical Therapy Treatment Patient Details Name: Joshua Horton MRN: [id number] DOB: [birth year] Today's Date: 11/20/2013 Time: 8546-2703 PT Time Calculation (min): 26 min  PT Assessment / Plan / Recommendation  History of Present Illness Joshua Horton is an 68 y.o. male who was at home with his wife this evening 11/15/2013. While speaking with his wife had acute onset difficulty with speech. Then noted weakness on his left side as well. EMS was called at that time and the patient was brought in as a code stroke. CBG 118. Initial NIHSS of 5.  Patient given TPA.   PT Comments   Patient progressing very well today and is very motivated to progress with therapies. Continue to recommend comprehensive inpatient rehab (CIR) for post-acute therapy needs.   Follow Up Recommendations  CIR     Does the patient have the potential to tolerate intense rehabilitation     Barriers to Discharge        Equipment Recommendations       Recommendations for Other Services    Frequency Min 4X/week   Progress towards PT Goals Progress towards PT goals: Progressing toward goals  Plan Current plan remains appropriate    Precautions / Restrictions Precautions Precautions: Fall Restrictions Weight Bearing Restrictions: No   Pertinent Vitals/Pain Denied pain    Mobility  Bed Mobility Supine to Sit: 3: Mod assist;With rails Details for Bed Mobility Assistance: Verbal cues for technique.   Transfers Sit to Stand: 4: Min assist;From chair/3-in-1;From bed;With upper extremity assist;3: Mod assist Sit to Stand: Patient Percentage: 60% Stand to Sit: 4: Min assist;With upper extremity assist;To chair/3-in-1;To bed Details for Transfer Assistance: v/c's for hand placement, assist to place L hand in optimal supportive position and assist for upright posture and anterior weight shift Ambulation/Gait Ambulation/Gait Assistance: 3: Mod assist Ambulation Distance (Feet): 200 Feet Assistive device: 1 person  hand held assist Ambulation/Gait Assistance Details: Patient with seated rest break. Patient with what appeared to be better clearance of his L foot (per patient). Slightly off balance to L at time but overall great progress and motivation Gait Pattern: Step-through pattern;Decreased stride length;Decreased step length - left;Decreased stance time - left;Antalgic;Narrow base of support Gait velocity: slow    Exercises     PT Diagnosis:    PT Problem List:   PT Treatment Interventions:     PT Goals (current goals can now be found in the care plan section)    Visit Information  Last PT Received On: 11/20/13 Assistance Needed: +1 History of Present Illness: Joshua Horton is an 68 y.o. male who was at home with his wife this evening 11/15/2013. While speaking with his wife had acute onset difficulty with speech. Then noted weakness on his left side as well. EMS was called at that time and the patient was brought in as a code stroke. CBG 118. Initial NIHSS of 5.  Patient given TPA.    Subjective Data      Cognition  Cognition Arousal/Alertness: Awake/alert Behavior During Therapy: WFL for tasks assessed/performed Overall Cognitive Status: Within Functional Limits for tasks assessed    Balance  Static Sitting Balance Static Sitting - Comment/# of Minutes: Worked on PNF flexion/extension with assist while sitting EOB Static Standing Balance Static Standing - Balance Support: Right upper extremity supported Static Standing - Level of Assistance: 4: Min assist  End of Session PT - End of Session Equipment Utilized During Treatment: Gait belt Activity Tolerance: Patient tolerated treatment well Patient left: in chair;with call bell/phone  within reach Nurse Communication: Mobility status   GP     Jacqualyn Posey 11/20/2013, 11:54 AM 11/20/2013 Jacqualyn Posey PTA [phone] pager [phone] office

## 2013-11-21 ENCOUNTER — Encounter (HOSPITAL_COMMUNITY): Payer: Self-pay | Admitting: *Deleted

## 2013-11-21 ENCOUNTER — Encounter (HOSPITAL_COMMUNITY): Payer: Self-pay | Admitting: Nurse Practitioner

## 2013-11-21 ENCOUNTER — Inpatient Hospital Stay (HOSPITAL_COMMUNITY)
Admission: RE | Admit: 2013-11-21 | Discharge: 2013-11-28 | DRG: 945 | Disposition: A | Discharge status: Home / Self Care | Payer: Medicare Other | Source: Intra-hospital | Attending: Physical Medicine & Rehabilitation | Admitting: Physical Medicine & Rehabilitation

## 2013-11-21 DIAGNOSIS — K5900 Constipation, unspecified: Secondary | ICD-10-CM | POA: Diagnosis not present

## 2013-11-21 DIAGNOSIS — Z8261 Family history of arthritis: Secondary | ICD-10-CM | POA: Diagnosis not present

## 2013-11-21 DIAGNOSIS — R066 Hiccough: Secondary | ICD-10-CM

## 2013-11-21 DIAGNOSIS — G8929 Other chronic pain: Secondary | ICD-10-CM | POA: Diagnosis not present

## 2013-11-21 DIAGNOSIS — Z7982 Long term (current) use of aspirin: Secondary | ICD-10-CM | POA: Diagnosis not present

## 2013-11-21 DIAGNOSIS — H9190 Unspecified hearing loss, unspecified ear: Secondary | ICD-10-CM

## 2013-11-21 DIAGNOSIS — R1310 Dysphagia, unspecified: Secondary | ICD-10-CM

## 2013-11-21 DIAGNOSIS — I639 Cerebral infarction, unspecified: Secondary | ICD-10-CM | POA: Diagnosis present

## 2013-11-21 DIAGNOSIS — Z8249 Family history of ischemic heart disease and other diseases of the circulatory system: Secondary | ICD-10-CM | POA: Diagnosis not present

## 2013-11-21 DIAGNOSIS — R4789 Other speech disturbances: Secondary | ICD-10-CM

## 2013-11-21 DIAGNOSIS — I6340 Cerebral infarction due to embolism of unspecified cerebral artery: Secondary | ICD-10-CM

## 2013-11-21 DIAGNOSIS — R29898 Other symptoms and signs involving the musculoskeletal system: Secondary | ICD-10-CM | POA: Diagnosis not present

## 2013-11-21 DIAGNOSIS — Z79899 Other long term (current) drug therapy: Secondary | ICD-10-CM | POA: Diagnosis not present

## 2013-11-21 DIAGNOSIS — E785 Hyperlipidemia, unspecified: Secondary | ICD-10-CM | POA: Diagnosis present

## 2013-11-21 DIAGNOSIS — I252 Old myocardial infarction: Secondary | ICD-10-CM | POA: Diagnosis not present

## 2013-11-21 DIAGNOSIS — I509 Heart failure, unspecified: Secondary | ICD-10-CM | POA: Diagnosis not present

## 2013-11-21 DIAGNOSIS — J449 Chronic obstructive pulmonary disease, unspecified: Secondary | ICD-10-CM | POA: Diagnosis not present

## 2013-11-21 DIAGNOSIS — M25559 Pain in unspecified hip: Secondary | ICD-10-CM

## 2013-11-21 DIAGNOSIS — S46019A Strain of muscle(s) and tendon(s) of the rotator cuff of unspecified shoulder, initial encounter: Secondary | ICD-10-CM

## 2013-11-21 DIAGNOSIS — I10 Essential (primary) hypertension: Secondary | ICD-10-CM | POA: Diagnosis not present

## 2013-11-21 DIAGNOSIS — T45615A Adverse effect of thrombolytic drugs, initial encounter: Secondary | ICD-10-CM | POA: Diagnosis not present

## 2013-11-21 DIAGNOSIS — K219 Gastro-esophageal reflux disease without esophagitis: Secondary | ICD-10-CM

## 2013-11-21 DIAGNOSIS — I619 Nontraumatic intracerebral hemorrhage, unspecified: Secondary | ICD-10-CM | POA: Diagnosis not present

## 2013-11-21 DIAGNOSIS — IMO0002 Reserved for concepts with insufficient information to code with codable children: Secondary | ICD-10-CM

## 2013-11-21 DIAGNOSIS — I739 Peripheral vascular disease, unspecified: Secondary | ICD-10-CM | POA: Diagnosis not present

## 2013-11-21 DIAGNOSIS — Z9861 Coronary angioplasty status: Secondary | ICD-10-CM | POA: Diagnosis not present

## 2013-11-21 DIAGNOSIS — Z841 Family history of disorders of kidney and ureter: Secondary | ICD-10-CM | POA: Diagnosis not present

## 2013-11-21 DIAGNOSIS — I2510 Atherosclerotic heart disease of native coronary artery without angina pectoris: Secondary | ICD-10-CM | POA: Diagnosis not present

## 2013-11-21 DIAGNOSIS — Z5189 Encounter for other specified aftercare: Secondary | ICD-10-CM | POA: Diagnosis present

## 2013-11-21 DIAGNOSIS — J4489 Other specified chronic obstructive pulmonary disease: Secondary | ICD-10-CM

## 2013-11-21 DIAGNOSIS — M25551 Pain in right hip: Secondary | ICD-10-CM | POA: Diagnosis present

## 2013-11-21 DIAGNOSIS — Z91041 Radiographic dye allergy status: Secondary | ICD-10-CM

## 2013-11-21 DIAGNOSIS — M25552 Pain in left hip: Secondary | ICD-10-CM

## 2013-11-21 DIAGNOSIS — F411 Generalized anxiety disorder: Secondary | ICD-10-CM | POA: Diagnosis not present

## 2013-11-21 MED ORDER — FLEET ENEMA 7-19 GM/118ML RE ENEM
1.0000 | ENEMA | Freq: Once | RECTAL | Status: AC | PRN
  Filled 2013-11-21: qty 1

## 2013-11-21 MED ORDER — SENNOSIDES-DOCUSATE SODIUM 8.6-50 MG PO TABS
1.0000 | ORAL_TABLET | Freq: Every evening | ORAL | Status: DC | PRN
  Administered 2013-11-23: 1 via ORAL
  Filled 2013-11-21: qty 1

## 2013-11-21 MED ORDER — ONDANSETRON HCL 4 MG/2ML IJ SOLN: 4.0000 mg | Freq: Four times a day (QID) | INTRAMUSCULAR | Status: DC | PRN

## 2013-11-21 MED ORDER — BISACODYL 10 MG RE SUPP: 10.0000 mg | Freq: Every day | RECTAL | Status: DC | PRN

## 2013-11-21 MED ORDER — PANTOPRAZOLE SODIUM 40 MG PO TBEC
40.0000 mg | DELAYED_RELEASE_TABLET | Freq: Every day | ORAL | Status: DC
  Administered 2013-11-22 – 2013-11-28 (×7): 40 mg via ORAL
  Filled 2013-11-21 (×9): qty 1

## 2013-11-21 MED ORDER — GUAIFENESIN-DM 100-10 MG/5ML PO SYRP: 5.0000 mL | ORAL_SOLUTION | Freq: Four times a day (QID) | ORAL | Status: DC | PRN

## 2013-11-21 MED ORDER — SELENIUM SULFIDE 1 % EX LOTN
1.0000 "application " | TOPICAL_LOTION | Freq: Every day | CUTANEOUS | Status: DC
  Administered 2013-11-22 – 2013-11-28 (×7): 1 via TOPICAL
  Filled 2013-11-21: qty 207

## 2013-11-21 MED ORDER — ENOXAPARIN SODIUM 40 MG/0.4ML ~~LOC~~ SOLN
40.0000 mg | SUBCUTANEOUS | Status: DC
  Administered 2013-11-21 – 2013-11-27 (×7): 40 mg via SUBCUTANEOUS
  Filled 2013-11-21 (×8): qty 0.4

## 2013-11-21 MED ORDER — AMLODIPINE BESYLATE 5 MG PO TABS
5.0000 mg | ORAL_TABLET | Freq: Every day | ORAL | Status: DC
  Administered 2013-11-22 – 2013-11-28 (×7): 5 mg via ORAL
  Filled 2013-11-21 (×8): qty 1

## 2013-11-21 MED ORDER — HYDROCERIN EX CREA
TOPICAL_CREAM | Freq: Two times a day (BID) | CUTANEOUS | Status: DC
  Administered 2013-11-21 – 2013-11-27 (×12): via TOPICAL
  Administered 2013-11-28: 1 via TOPICAL
  Filled 2013-11-21: qty 113

## 2013-11-21 MED ORDER — TRAZODONE HCL 50 MG PO TABS: 25.0000 mg | ORAL_TABLET | Freq: Every evening | ORAL | Status: DC | PRN

## 2013-11-21 MED ORDER — ACETAMINOPHEN 325 MG PO TABS: 325.0000 mg | ORAL_TABLET | ORAL | Status: DC | PRN

## 2013-11-21 MED ORDER — DICLOFENAC SODIUM 1 % TD GEL
2.0000 g | Freq: Three times a day (TID) | TRANSDERMAL | Status: DC
  Administered 2013-11-22 – 2013-11-28 (×15): 2 g via TOPICAL
  Filled 2013-11-21 (×2): qty 100

## 2013-11-21 MED ORDER — ATORVASTATIN CALCIUM 20 MG PO TABS
20.0000 mg | ORAL_TABLET | Freq: Every day | ORAL | Status: DC
  Administered 2013-11-21 – 2013-11-27 (×7): 20 mg via ORAL
  Filled 2013-11-21 (×8): qty 1

## 2013-11-21 MED ORDER — ALUM & MAG HYDROXIDE-SIMETH 200-200-20 MG/5ML PO SUSP: 30.0000 mL | ORAL | Status: DC | PRN

## 2013-11-21 MED ORDER — ONDANSETRON HCL 4 MG PO TABS: 4.0000 mg | ORAL_TABLET | Freq: Four times a day (QID) | ORAL | Status: DC | PRN

## 2013-11-21 NOTE — Progress Notes (Addendum)
Pt. Transferred to CIR.  Assessments and vital signs were stable.

## 2013-11-21 NOTE — Progress Notes (Signed)
Stroke Team Progress Note  HISTORY Joshua Horton is an 68 y.o. male who was at home with his wife this evening 11/15/2013. While speaking with his wife had acute onset difficulty with speech. Then noted weakness on his left side as well. EMS was called at that time and the patient was brought in as a code stroke. CBG 118. Initial NIHSS of 5. The patient reports that he had a similar episode on 11/12/2013. His symptoms resolved completely after about 30 minutes and the patient refused transport by EMS. Medical attention was not sought.  Patient was given TPA. He was admitted to the neuro ICU  for further evaluation and treatment.  SUBJECTIVE Pt in bed. Wife in geri-chair at bedside.  OBJECTIVE Most recent Vital Signs: Filed Vitals:   11/20/13 2147 11/21/13 0124 11/21/13 0506 11/21/13 1012  BP: 161/79 142/75 148/76 153/72  Pulse: 85 83 88 76  Temp: 97.5 F (36.4 C) 98.1 F (36.7 C) 98.3 F (36.8 C) 98.5 F (36.9 C)  TempSrc: Oral Oral Oral Oral  Resp: 18 18 18 18   Height:      Weight:      SpO2: 95% 93% 95% 94%   CBG (last 3)  No results found for this basename: GLUCAP,  in the last 72 hours  IV Fluid Intake:      MEDICATIONS  . amLODipine  5 mg Oral Daily  . atorvastatin  20 mg Oral q1800  . pantoprazole  40 mg Oral Daily   PRN:  acetaminophen, acetaminophen, labetalol, senna-docusate  Diet:  Dysphagia 1 thin liquids Activity:  OOB DVT Prophylaxis:  SCDs   CLINICALLY SIGNIFICANT STUDIES Basic Metabolic Panel:   Recent Labs Lab 11/16/13 0042 11/16/13 0049 11/18/13 0350  NA 135* 135* 140  K 4.0 3.8 3.8  CL 96 100 103  CO2 25  --  26  GLUCOSE 135* 136* 96  BUN 9 8 13   CREATININE 0.83 0.90 0.87  CALCIUM 9.2  --  9.1   Liver Function Tests:   Recent Labs Lab 11/16/13 0042  AST 30  ALT 37  ALKPHOS 105  BILITOT 0.3  PROT 7.5  ALBUMIN 3.9   CBC:   Recent Labs Lab 11/16/13 0042 11/16/13 0049 11/18/13 0350  WBC 7.4  --  6.4  NEUTROABS 3.6  --  3.5   HGB 15.8 17.0 15.3  HCT 46.5 50.0 45.9  MCV 87.6  --  88.4  PLT 216  --  211   Coagulation:   Recent Labs Lab 11/16/13 0042  LABPROT 12.2  INR 0.92   Cardiac Enzymes:   Recent Labs Lab 11/16/13 0042  TROPONINI <0.30   Urinalysis: No results found for this basename: COLORURINE, APPERANCEUR, LABSPEC, [place], GLUCOSEU, HGBUR, BILIRUBINUR, KETONESUR, PROTEINUR, UROBILINOGEN, NITRITE, LEUKOCYTESUR,  in the last 168 hours Lipid Panel    Component Value Date/Time   CHOL 169 11/16/2013 0402   TRIG 215* 11/16/2013 0402   HDL 32* 11/16/2013 0402   CHOLHDL 5.3 11/16/2013 0402   VLDL 43* 11/16/2013 0402   LDLCALC 94 11/16/2013 0402   HgbA1C  Lab Results  Component Value Date   HGBA1C 6.5* 11/16/2013    Urine Drug Screen:   No results found for this basename: labopia,  cocainscrnur,  labbenz,  amphetmu,  thcu,  labbarb    Alcohol Level:   Recent Labs Lab 11/16/13 [place] <11     CT of the brain   11/18/2013    1. Normal expected interval evolution of  scattered right MCA territory infarcts, most confluent within the operculum. 2. Interval decrease in size of right occipital hematoma, now measuring approximately 2.5 x 1.8 cm, previously 3.0 x 1.9 cm. Scattered subarachnoid hemorrhage is also decreased in conspicuity, likely related to redistribution. 3. No new intracranial hemorrhage or other process identified. 4. No midline shift or hydrocephalus.   11/17/2013   1. Similar size and distribution of scattered right MCA territory infarcts, most confluent within the right operculum. 2. Stable size of right occipital lobe hemorrhagic infarction with hematoma, measuring approximately 3.0 x 1.9 cm. 3. Subarachnoid hemorrhage within the posterior right temporal and right occipital lobe, similar to prior. Trace subarachnoid now visible within the right frontal and left frontal lobes, which may be related to redistribution. No intraventricular or definite new intracranial hemorrhage. 4. No significant  mass effect, midline shift, or hydrocephalus at this time. No new infarct.    11/16/2013   Focal low-attenuation change in the deep white matter on the left suggesting age-indeterminate ischemic focus. No mass effect or midline shift. No acute intracranial hemorrhage. Diffuse cerebral atrophy.    MRI of the brain  11/16/2013   1. Scattered acute right MCA infarcts, most confluent at the posterior insula. 2. Superimposed right occipital lobe hemorrhagic infarction with focal hematoma encompassing 30 x 19 x 13 mm. 3. Superimposed small volume of subarachnoid hemorrhage in the right hemisphere. 4. No intraventricular hemorrhage, ventriculomegaly, or significant mass effect at this time.   MRA of the brain  11/16/2013   Right MCA M2 segment occlusions/stenoses. Middle and posterior sylvian branches most affected. Right MCA M1 segment remains patent.  Suspect plaque or thrombus in the supraclinoid right ICA causing mild to moderate stenosis.   2D Echocardiogram  Normal LV size and vigorous systolic function, EF [id number]. Normal RV size and systolic function. No significant valvular abnormalities.  Carotid Doppler  Findings suggest 40-59% right internal carotid artery stenosis, 1-39% left internal carotid artery stenosis. Vertebral arteries are patent with antegrade flow.  TEE No PFO negative bubble. No LAA thrombus. No source of embolus  CXR  11/16/2013    No acute cardiopulmonary disease.     EKG  normal sinus rhythm.   Therapy Recommendations CIR  Physical Exam   General Examination:  HEENT- Normocephalic, no lesions, without obvious abnormality. Normal external eye and conjunctiva. Normal external nose, mucus membranes and septum. Normal pharynx.  Neck supple with no masses, nodes, nodules or enlargement.  Cardiovascular - S1, S2 normal  Lungs - chest clear, no wheezing, rales, normal symmetric air entry  Abdomen - soft, non-tender; bowel sounds normal; Extremities - mild edema in lower extremities  bilaterally  Neurologic Examination:  Mental Status:  Alert, oriented, thought content appropriate. Speech fluent but markedly dysarthric. Able to follow 3 step commands without difficulty.  Cranial Nerves:  II: Visual fields grossly normal, pupils equal, round, reactive to light III,IV, VI: ptosis not present, extra-ocular motions intact bilaterally  V,VII: left facial droop, facial light touch sensation decreased on the left side of the face  VIII: hearing normal bilaterally  IX,X: gag reflex reduced  XI: bilateral shoulder shrug  XII: midline tongue extension  Motor:  Right : Upper extremity 5/5 Left: Upper extremity 3/5  Lower extremity 5/5 Lower extremity 5-/5  Tone and bulk:normal tone throughout; no atrophy noted  Sensory: Pinprick and light touch decreased in the left upper extremity  Deep Tendon Reflexes: 1+ and symmetric throughout with absent AJ's bilaterally  Plantars:  Right: downgoing Left: upgoing  Cerebellar:  normal finger-to-nose on the right and normal heel-to-shin testing bilaterally. Finger to nose difficult on the left due to weakness  Gait: Unable to test  CV: pulses palpable throughout   ASSESSMENT Joshua Horton is a 68 y.o. male presenting with difficulty with speech, left sided weakness. S/P IV t-PA 11/16/2013 at 0058. Imaging shows scattered right MCA infarcts, right occipital lobe hemorrhagic infarction with focal hematoma encompassing 30 x 19 x 13 mm with small volume of right subarachnoid hemorrhage. Patient with post-tPA hemorrhagic transformation,clinically asymptomatic. Infarct felt to be embolic secondary to unknown source - TEE negative, loop placed.  On aspirin 81 mg orally every day prior to admission. Now on no antithrombotic for secondary stroke prevention given hemorrhe. Work up completed.  hypertension Hyperlipidemia, LDL 95, on zocor 40 mg daily PTA, now on no statin, at goal LDL < 100  Hyperglycemia, HgbA1c 6.5  CAD - CABG w/ stent  2003  PVD  Carotid stenosis, R 40-59%  History of DVT  Cigarette smoker  COPD  Obesity, Body mass index is 31.98 kg/(m^2).   Hospital day # 5  TREATMENT/PLAN Transfer to rehab once insurance approval received Cardiology to f/u implantable loop recorder via [facility] OP obstructive sleep apnea evaluation after rehab discharge Resume home BP meds as appropriate. Discussed with Algis Liming, PA, from rehab. They will address and follow BP there. CT in ~1 week to insure hmg resolution prior to resuming antithrombotic. Patient has a 10-15% risk of having another stroke over the next year, the highest risk is within 2 weeks of the most recent stroke/TIA (risk of having a stroke following a stroke or TIA is the same). Ongoing risk factor control by Primary Care Physician Follow up with Dr. Leonie Man, [facility], in 2 months.  Dr. Leonie Man D/W pt, wife and answered questions  Burnetta Sabin, MSN, RN, ANVP-BC, ANP-BC, GNP-BC [facility] Pager: [phone] 11/21/2013 11:17 AM  I have personally obtained a history, examined the patient, evaluated imaging results, and formulated the assessment and plan of care. I agree with the above.  Antony Contras, MD

## 2013-11-21 NOTE — Discharge Summary (Signed)
Stroke Discharge Summary  Patient ID: Joshua Horton   MRN: [id number]      DOB: [birth year]  Date of Admission: 11/16/2013 Date of Discharge: 11/21/2013  Attending Physician:  Suzzanne Cloud, MD, Stroke MD  Consulting Physician(s):   Virl Axe, MD (electrophysiology), Alger Simons, MD (Physical Medicine & Rehabtilitation)  Patient's PCP:  Gar Ponto, MD  Discharge Diagnoses:   Embolic cererbral infarcts   S/P IV t-PA   post-tPA hemorrhagic transformation,clinically asymptomatic.  scattered right MCA ischemic infarcts  right occipital lobe hemorrhagic infarction with focal hematoma  small volume of right subarachnoid hemorrhage.   hypertension   Hyperlipidemia  Hyperglycemia  Carotid stenosis  Cigarette smoker   Obesity, Body mass index is 31.98 kg/(m^2).    BMI  Body mass index is 31.98 kg/(m^2).  Past Medical History  Diagnosis Date  . Coronary artery disease   . Wheezing   . Chronic cough   . Peripheral vascular disease   . Hypertension   . Hyperlipidemia   . Reflux   . COPD (chronic obstructive pulmonary disease)   . Myocardial infarction   . CHF (congestive heart failure)    Past Surgical History  Procedure Laterality Date  . Coronary angioplasty with stent placement  2003  . Cervical disc surgery      Dr Vertell Limber  . Tee without cardioversion N/A 11/19/2013    Procedure: TRANSESOPHAGEAL ECHOCARDIOGRAM (TEE);  Surgeon: Josue Hector, MD;  Location: [facility] ENDOSCOPY;  Service: Cardiovascular;  Laterality: N/A;    Medications to be continued on Rehab . amLODipine  5 mg Oral Daily  . atorvastatin  20 mg Oral q1800  . pantoprazole  40 mg Oral Daily    LABORATORY STUDIES CBC    Component Value Date/Time   WBC 6.4 11/18/2013 0350   RBC 5.19 11/18/2013 0350   HGB 15.3 11/18/2013 0350   HCT 45.9 11/18/2013 0350   PLT 211 11/18/2013 0350   MCV 88.4 11/18/2013 0350   MCH 29.5 11/18/2013 0350   MCHC 33.3 11/18/2013 0350   RDW 13.9 11/18/2013 0350    LYMPHSABS 2.0 11/18/2013 0350   MONOABS 0.7 11/18/2013 0350   EOSABS 0.2 11/18/2013 0350   BASOSABS 0.0 11/18/2013 0350   CMP    Component Value Date/Time   NA 140 11/18/2013 0350   K 3.8 11/18/2013 0350   CL 103 11/18/2013 0350   CO2 26 11/18/2013 0350   GLUCOSE 96 11/18/2013 0350   BUN 13 11/18/2013 0350   CREATININE 0.87 11/18/2013 0350   CALCIUM 9.1 11/18/2013 0350   PROT 7.5 11/16/2013 0042   ALBUMIN 3.9 11/16/2013 0042   AST 30 11/16/2013 0042   ALT 37 11/16/2013 0042   ALKPHOS 105 11/16/2013 0042   BILITOT 0.3 11/16/2013 0042   GFRNONAA 87* 11/18/2013 0350   GFRAA >90 11/18/2013 0350   COAGS Lab Results  Component Value Date   INR 0.92 11/16/2013   Lipid Panel    Component Value Date/Time   CHOL 169 11/16/2013 0402   TRIG 215* 11/16/2013 0402   HDL 32* 11/16/2013 0402   CHOLHDL 5.3 11/16/2013 0402   VLDL 43* 11/16/2013 0402   LDLCALC 94 11/16/2013 0402   HgbA1C  Lab Results  Component Value Date   HGBA1C 6.5* 11/16/2013   Cardiac Panel (last 3 results) No results found for this basename: CKTOTAL, CKMB, TROPONINI, RELINDX,  in the last 72 hours Urinalysis No results found for this basename: colorurine, appearanceur, labspec, phurine, glucoseu, hgbur, bilirubinur,  ketonesur, proteinur, urobilinogen, nitrite, leukocytesur   Urine Drug Screen  No results found for this basename: labopia, cocainscrnur, labbenz, amphetmu, thcu, labbarb    Alcohol Level    Component Value Date/Time   [facility] <11 11/16/2013 0042     SIGNIFICANT DIAGNOSTIC STUDIES CT of the brain  11/18/2013 1. Normal expected interval evolution of scattered right MCA territory infarcts, most confluent within the operculum. 2. Interval decrease in size of right occipital hematoma, now measuring approximately 2.5 x 1.8 cm, previously 3.0 x 1.9 cm. Scattered subarachnoid hemorrhage is also decreased in conspicuity, likely related to redistribution. 3. No new intracranial hemorrhage or other process identified. 4. No midline shift or hydrocephalus.   11/17/2013 1. Similar size and distribution of scattered right MCA territory infarcts, most confluent within the right operculum. 2. Stable size of right occipital lobe hemorrhagic infarction with hematoma, measuring approximately 3.0 x 1.9 cm. 3. Subarachnoid hemorrhage within the posterior right temporal and right occipital lobe, similar to prior. Trace subarachnoid now visible within the right frontal and left frontal lobes, which may be related to redistribution. No intraventricular or definite new intracranial hemorrhage. 4. No significant mass effect, midline shift, or hydrocephalus at this time. No new infarct.  11/16/2013 Focal low-attenuation change in the deep white matter on the left suggesting age-indeterminate ischemic focus. No mass effect or midline shift. No acute intracranial hemorrhage. Diffuse cerebral atrophy.  MRI of the brain 11/16/2013 1. Scattered acute right MCA infarcts, most confluent at the posterior insula. 2. Superimposed right occipital lobe hemorrhagic infarction with focal hematoma encompassing 30 x 19 x 13 mm. 3. Superimposed small volume of subarachnoid hemorrhage in the right hemisphere. 4. No intraventricular hemorrhage, ventriculomegaly, or significant mass effect at this time.  MRA of the brain 11/16/2013 Right MCA M2 segment occlusions/stenoses. Middle and posterior sylvian branches most affected. Right MCA M1 segment remains patent. Suspect plaque or thrombus in the supraclinoid right ICA causing mild to moderate stenosis.  2D Echocardiogram Normal LV size and vigorous systolic function, EF 35-00%. Normal RV size and systolic function. No significant valvular abnormalities.  Carotid Doppler Findings suggest 40-59% right internal carotid artery stenosis, 1-39% left internal carotid artery stenosis. Vertebral arteries are patent with antegrade flow.  TEE No PFO negative bubble. No LAA thrombus. No source of embolus  CXR 11/16/2013 No acute cardiopulmonary disease.  EKG normal  sinus rhythm.      History of Present Illness   Joshua Horton is an 68 y.o. male who was at home with his wife this evening 11/15/2013. While speaking with his wife had acute onset difficulty with speech. Then noted weakness on his left side as well. EMS was called at that time and the patient was brought in as a code stroke. CBG 118. Initial NIHSS of 5. The patient reports that he had a similar episode on 11/12/2013. His symptoms resolved completely after about 30 minutes and the patient refused transport by EMS. Medical attention was not sought. Patient was given TPA. He was admitted to the neuro ICU for further evaluation and treatment.   Hospital Course post tPA Imaging shows scattered right MCA infarcts, right occipital lobe hemorrhagic infarction with focal hematoma 30 x 19 x 13 mm with small volume of right subarachnoid hemorrhage. Patient with post-tPA hemorrhagic transformation which was clinically asymptomatic. Infarct felt to be embolic secondary to unknown source - TEE negative, loop recorder placed to evaluate for possible atrial fibrillation. Pt was on aspirin 81 mg orally every day prior to admission.  Now on no antithrombotic for secondary stroke prevention given hemorrhage. Will follow up CT in 1 week prior to resuming antithrombotic.  Patient with vascular risk factors of:  hypertension - home meds discontinued in hospital, new BP medication added, part of home medication will be added on rehab with follow up there. They will resume entire medication based on progress. Discussed with Algis Liming, PA on rehab unit. Hyperlipidemia, LDL 95, on zocor 40 mg daily PTA, statin resumed at discharge, pt at goal LDL < 100  Hyperglycemia, HgbA1c 6.5 CAD - CABG w/ stent 2003  PVD  Carotid stenosis, R 40-59%  Cigarette smoker  Obesity, Body mass index is 31.98 kg/(m^2).   Patient with resultant left hemiparesis. Physical therapy, occupational therapy and speech therapy evaluated patient. All  agreed inpatient rehab is needed. Patient's wife is/are supportive and can provide care at discharge. CIR bed is available today and patient will be transferred there.  Discharge Exam  Blood pressure 138/76, pulse 90, temperature 98.7 F (37.1 C), temperature source Oral, resp. rate 18, height 5\' 10"  (1.778 m), weight 101.1 kg (222 lb 14.2 oz), SpO2 93.00%.  General Examination:  HEENT- Normocephalic, no lesions, without obvious abnormality. Normal external eye and conjunctiva. Normal external nose, mucus membranes and septum. Normal pharynx.  Neck supple with no masses, nodes, nodules or enlargement.  Cardiovascular - S1, S2 normal  Lungs - chest clear, no wheezing, rales, normal symmetric air entry  Abdomen - soft, non-tender; bowel sounds normal;  Extremities - mild edema in lower extremities bilaterally  Neurologic Examination:  Mental Status:  Alert, oriented, thought content appropriate. Speech fluent but markedly dysarthric. Able to follow 3 step commands without difficulty.  Cranial Nerves:  II: Visual fields grossly normal, pupils equal, round, reactive to light  III,IV, VI: ptosis not present, extra-ocular motions intact bilaterally  V,VII: left facial droop, facial light touch sensation decreased on the left side of the face  VIII: hearing normal bilaterally  IX,X: gag reflex reduced  XI: bilateral shoulder shrug  XII: midline tongue extension  Motor:  Right : Upper extremity 5/5 Left: Upper extremity 3/5  Lower extremity 5/5 Lower extremity 5-/5  Tone and bulk:normal tone throughout; no atrophy noted  Sensory: Pinprick and light touch decreased in the left upper extremity  Deep Tendon Reflexes: 1+ and symmetric throughout with absent AJ's bilaterally  Plantars:  Right: downgoing Left: upgoing  Cerebellar:  normal finger-to-nose on the right and normal heel-to-shin testing bilaterally. Finger to nose difficult on the left due to weakness  Gait: Unable to test  CV: pulses  palpable throughout    Discharge Diet  Dysphagia 1 thin liquids  Discharge Plan  Disposition:  Transfer to [facility] for ongoing PT, OT and ST  CT in ~1 week to insure hmg resolution prior to resuming antithrombotic.  Rehab to address home BP meds and follow BP there  Cardiology to f/u implantable loop recorder via [facility]   OP obstructive sleep apnea evaluation after rehab discharge \  Patient has a 10-15% risk of having another stroke over the next year, the highest risk is within 2 weeks of the most recent stroke/TIA (risk of having a stroke following a stroke or TIA is the same).   Ongoing risk factor control by Primary Care Physician   Follow up with Dr. Leonie Man, [facility], in 2 months.  Follow-up with Dr. Antony Contras, Stroke Clinic in 2 months.  45 minutes were spent preparing discharge.  Signed Burnetta Sabin,  AVNP, ANP-BC, GNP-BC [facility] Nurse Practitioner 11/21/2013, 3:00 PM  I have personally examined this patient, reviewed pertinent data and developed the plan of care. I agree with above.  Antony Contras, MD

## 2013-11-21 NOTE — Progress Notes (Signed)
Rehab admissions - I have approval for acute inpatient rehab admission and will admit today.  Call me for questions.  #[phone]

## 2013-11-21 NOTE — Progress Notes (Signed)
Occupational Therapy Treatment Patient Details Name: GEOVANNI RAHMING MRN: [id number] DOB: Apr 27, 1946 Today's Date: 11/21/2013 Time: 6967-8938 OT Time Calculation (min): 27 min  OT Assessment / Plan / Recommendation  History of present illness QUINNTON BURY is an 68 y.o. male who was at home with his wife this evening 11/15/2013. While speaking with his wife had acute onset difficulty with speech. Then noted weakness on his left side as well. EMS was called at that time and the patient was brought in as a code stroke. CBG 118. Initial NIHSS of 5.  Patient given TPA.   OT comments  Pt demonstrates need for continued therapy to help with carry over and reinforce correct LT UE positioning. Pt recalled protection of LT Ue and advised wife not to pull on arm during bed transfer. Pt striking hand on sink and door frame this session. PT states "I got to remember to watch that thing. I just hit it again." Pt remains very strong CIR candidate at this time.   Follow Up Recommendations  CIR    Barriers to Discharge       Equipment Recommendations  Other (comment)    Recommendations for Other Services Rehab consult  Frequency Min 3X/week   Progress towards OT Goals Progress towards OT goals: Progressing toward goals  Plan Discharge plan remains appropriate    Precautions / Restrictions Precautions Precautions: Fall Precaution Comments: risk for subluxation and injury to left hand due to decr ROM and sensation Restrictions Weight Bearing Restrictions: No   Pertinent Vitals/Pain none    ADL  Grooming: Minimal assistance;Teeth care Where Assessed - Grooming: Unsupported standing (poor recall of sequence using bil UE needs reeducation) Toilet Transfer: Minimal assistance Toilet Transfer Method: Sit to stand Toilet Transfer Equipment: Raised toilet seat with arms (or 3-in-1 over toilet) (requires BIL UE and (A) for LT UE on arm rest) Equipment Used: Gait belt Transfers/Ambulation Related to  ADLs: Pt completed sit<>stand from chair transfer. pt requires LT UE (A) to position and provide weight bearing. pt with LT UE positioned on sink for weight bearing during grooming ADL Comments: Pt with no recall of therapist from previous session. Pt reports to wife "she made me do work. " Pt pushed to describe task. Pt states "she had me doing face exercises" Pt reeducated on OT session and using teach back together re discussed positioning Lt uE  for edema managment and importance of weight bearing. Wife reports "he did tell me when i was helping in the bed. Don't pull on that arm." Pt required cues to problem solve placement of tooth brush in Lt hand with weight bearing on sink to apply paste with RT UE. pt educated on static standing weight bearing shoulder protraction/ retraction exercises usinig baby powder and wash cloth. Pt also completed circumduction with shoulder and abduction/adduction. Pt with wrist flexion and extension during task to help with edema and PROM. Pt returned to sitting in recliner at end of session. Pt educated on supination / pronation exercises x3 per day with wife present. Pt remains with no AROM of wrist.     OT Diagnosis:    OT Problem List:   OT Treatment Interventions:     OT Goals(current goals can now be found in the care plan section) Acute Rehab OT Goals Patient Stated Goal: to go home  OT Goal Formulation: With patient Time For Goal Achievement: 12/04/13 Potential to Achieve Goals: Good ADL Goals Pt Will Perform Grooming: with min guard assist;standing Pt Will Transfer  to Toilet: with min guard assist;ambulating;regular height toilet Additional ADL Goal #1: Pt will demonstrate HEP for LT UE Mod I  Visit Information  Last OT Received On: 11/21/13 Assistance Needed: +1 History of Present Illness: SILER MAVIS is an 68 y.o. male who was at home with his wife this evening 11/15/2013. While speaking with his wife had acute onset difficulty with speech. Then  noted weakness on his left side as well. EMS was called at that time and the patient was brought in as a code stroke. CBG 118. Initial NIHSS of 5.  Patient given TPA.    Subjective Data      Prior Functioning       Cognition  Cognition Arousal/Alertness: Awake/alert Behavior During Therapy: WFL for tasks assessed/performed Overall Cognitive Status: Impaired/Different from baseline Area of Impairment: Problem solving;Awareness;Memory Memory: Decreased short-term memory Awareness: Anticipatory Problem Solving: Slow processing;Difficulty sequencing    Mobility  Bed Mobility Bed Mobility: Not assessed Transfers Transfers: Sit to Stand;Stand to Sit Sit to Stand: 4: Min assist;With upper extremity assist;From chair/3-in-1 Stand to Sit: 4: Min assist;With upper extremity assist;To chair/3-in-1 Details for Transfer Assistance: hand over hane to (A) with LT UE placement on chair    Exercises      Balance     End of Session OT - End of Session Activity Tolerance: Patient tolerated treatment well Patient left: in chair;with call bell/phone within reach;with family/visitor present Nurse Communication: Mobility status;Precautions  GO     Peri Maris 11/21/2013, 9:44 AM  Pager: [phone]

## 2013-11-21 NOTE — Plan of Care (Signed)
Problem: Progression Outcomes Goal: Rehab Team goals identified Outcome: Completed/Met Date Met:  11/21/13 Rehab has recommended inpatient rehab as the appropriate setting for maximium recovery from hospitalization.

## 2013-11-21 NOTE — H&P (View-Only) (Signed)
Physical Medicine and Rehabilitation Admission H&P    Chief Complaint  Patient presents with  . Left sided weakness and slurred speech.     HPI: Joshua Horton is a 68 y.o. Left handed male with history of CAD, HTN, COPD; who was admitted on 11/16/13 with acute onset of speech difficulty and left sided weakness. Patient reported similar transient symptoms on 11/03/13. CT head without acute changes. Treated with tPA. MRI/MRA brain with scattered acute right MCA infarcts, most confluent at the posterior insula superimposed rigth occipital lobe hemorrhagic infarct with focal hematoma 30 X 19 X 13 mm, trace SAH over posterior right hemisphere, R MCA M2 segment occlusion/stenosis and suspicion of plaque or thrombus supraclinoid R- ICA causing mild to moderate stenosis. Carotid dopplers with 40-59% R--ICA stenosis and 1-39% L-ICA stenosis. 2D echo with EF 65-70% with grade 1 diastolic dysfunction and no wall abnormality.   MBS done on 01/03 showing multifactorial dysphagia with delayed transit and moderate residue therefore placed on D1, thin liquids. 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 TEE done revealing EF 65% and negative for ASD, PFO or LAA thrombus. Loop recorder placed by Dr. Caryl Comes on 11/19/13. NO blood thinner for now--neurology recommends repeat CCT in a week to decide on resuming low dose AS and cleared to use lovenox for DVT prophylaxis.  Therapies ongoing and rehab team recommended CIR for progression. Pt was ultimately admitted today.    Review of Systems  HENT: Positive for hearing loss.   Respiratory: Negative for cough, shortness of breath and wheezing.   Cardiovascular: Negative for chest pain and palpitations.  Gastrointestinal: Positive for heartburn (hiccups after meals. ). Negative for nausea and vomiting.  Genitourinary: Negative for urgency and frequency.  Musculoskeletal: Positive for joint pain (Chronic R>L hip pain. L-RTC  dysfunction--has declined surgery.).  Neurological: Positive for speech change, focal weakness and headaches. Negative for dizziness and tingling.  occasionally getting choked up on solid foods.   Past Medical History  Diagnosis Date  . Coronary artery disease   . Wheezing   . Chronic cough   . Peripheral vascular disease   . Hypertension   . Hyperlipidemia   . Reflux   . COPD (chronic obstructive pulmonary disease)   . Myocardial infarction   . CHF (congestive heart failure)     Past Surgical History  Procedure Laterality Date  . Coronary angioplasty with stent placement  2003  . Cervical disc surgery      Dr Vertell Limber  . Tee without cardioversion N/A 11/19/2013    Procedure: TRANSESOPHAGEAL ECHOCARDIOGRAM (TEE);  Surgeon: Josue Hector, MD;  Location: [facility] ENDOSCOPY;  Service: Cardiovascular;  Laterality: N/A;    Family History  Problem Relation Age of Onset  . Heart disease    . Arthritis    . Cancer    . Kidney disease    . Cancer Mother   . Deep vein thrombosis Father   . Heart disease Father   . Hyperlipidemia Father   . Hypertension Father   . Heart attack Father   . Peripheral vascular disease Father     Social History: Married. Retired from [employer]. He reports that he quit smoking about 7 months ago. His smoking use included Cigarettes. He smoked 1.00 pack per day. He has never used smokeless tobacco. He reports that he does not drink alcohol or use illicit drugs.    Allergies  Allergen Reactions  . Ivp Dye [Iodinated Diagnostic Agents]  Medications Prior to Admission  Medication Sig Dispense Refill  . nitroGLYCERIN (NITROSTAT) 0.4 MG SL tablet Place 0.4 mg under the tongue every 5 (five) minutes as needed for chest pain.      Marland Kitchen omeprazole (PRILOSEC) 20 MG capsule Take 20 mg by mouth daily.      . simvastatin (ZOCOR) 40 MG tablet Take 40 mg by mouth every evening.      . [DISCONTINUED] aspirin 81 MG tablet Take 81 mg by mouth daily.      .  [DISCONTINUED] lisinopril-hydrochlorothiazide (PRINZIDE,ZESTORETIC) 10-12.5 MG per tablet Take 1 tablet by mouth daily.        Home: Home Living Family/patient expects to be discharged to:: Inpatient rehab Living Arrangements: Spouse/significant other Available Help at Discharge: Family;Available 24 hours/day Type of Home: House Home Access: Stairs to enter [employer] of Steps: 2 Entrance Stairs-Rails: Right;Left Home Layout: One level Home Equipment: None   Functional History: Prior Function Comments: Drives  Functional Status:  Mobility: Bed Mobility Bed Mobility: Supine to Sit;Sitting - Scoot to Edge of Bed Rolling Left: 3: Mod assist;With rail Left Sidelying to Sit: 3: Mod assist;HOB elevated Supine to Sit: 2: Max assist;HOB flat;With rails Sitting - Scoot to Edge of Bed: 2: Max assist Transfers Transfers: Sit to Stand;Stand to [employer] Transfers Sit to Stand: 3: Mod assist;With upper extremity assist;From bed Sit to Stand: Patient Percentage: 60% Stand to Sit: 3: Mod assist;To bed Stand to Sit: Patient Percentage: 60% Stand Pivot Transfers: 1: +2 Total assist Stand Pivot Transfers: Patient Percentage: 60% Ambulation/Gait Ambulation/Gait Assistance: 3: Mod assist (+1 for chair follow/IV pole management) Ambulation Distance (Feet): 120 Feet Assistive device: 1 person hand held assist Ambulation/Gait Assistance Details: Pt with noted L LE weakness towards end of ambulation as demo'd by L knee intability and mild buckling Gait Pattern: Step-through pattern;Decreased stride length;Decreased step length - left;Decreased stance time - left;Antalgic;Narrow base of support Gait velocity: slow Stairs: No    ADL:    Cognition: Cognition Overall Cognitive Status: Within Functional Limits for tasks assessed Orientation Level: Oriented X4 Cognition Arousal/Alertness: Awake/alert Behavior During Therapy: WFL for tasks assessed/performed Overall  Cognitive Status: Within Functional Limits for tasks assessed  Physical Exam: Blood pressure 149/70, pulse 89, temperature 97.5 F (36.4 C), temperature source Oral, resp. rate 24, height 5\' 10"  (1.778 m), weight 101.1 kg (222 lb 14.2 oz), SpO2 93.00%. Physical Exam  Nursing note and vitals reviewed. Constitutional: He is oriented to person, place, and time. He appears well-developed and well-nourished.  HENT:  Head: Normocephalic and atraumatic.  Multiple dental caries.   Eyes: Pupils are equal, round, and reactive to light.  Neck: Normal range of motion. Neck supple. No JVD present. No tracheal deviation present. No thyromegaly present.  Cardiovascular: Normal rate and regular rhythm.  Exam reveals friction rub. Exam reveals no gallop.   No murmur heard. Respiratory: Effort normal and breath sounds normal. No respiratory distress. He has no wheezes. He has no rales.  GI: Soft. Bowel sounds are normal. He exhibits no distension. There is no tenderness. There is no rebound.  Musculoskeletal: Normal range of motion. He exhibits no edema and no tenderness.  Lymphadenopathy:    He has no cervical adenopathy.  Neurological: He is alert and oriented to person, place, and time.  Moderate to severe dysarthria with left facial droop--needs cues to slow rate of speech. Left tongue deviation.  Follows basic commands without difficulty.  Sensation 1+/2 left face,arm, leg. LUE is 2+ to 3- deltoid,  bicep, tricep while wrist and HI are 0/5. LLE is 4- HF, 4 KE and 4+ at ankle. DTR's are 1+ bilaterally. Seems to have reasonable insight and awareness   Skin: Skin is warm and dry.  Dry flaky skin in  hair line--beard as well as scalp in addition to extremities   Psychiatric: He has a normal mood and affect. His behavior is normal.    No results found for this or any previous visit (from the past 48 hour(s)). No results found.  Post Admission Physician Evaluation: 1. Functional deficits secondary  to  embolic right MCA infarcts of unclear source. 2. Patient is admitted to receive collaborative, interdisciplinary care between the physiatrist, rehab nursing staff, and therapy team. 3. Patient's level of medical complexity and substantial therapy needs in context of that medical necessity cannot be provided at a lesser intensity of care such as a SNF. 4. Patient has experienced substantial functional loss from his/her baseline which was documented above under the "Functional History" and "Functional Status" headings.  Judging by the patient's diagnosis, physical exam, and functional history, the patient has potential for functional progress which will result in measurable gains while on inpatient rehab.  These gains will be of substantial and practical use upon discharge  in facilitating mobility and self-care at the household level. 5. Physiatrist will provide 24 hour management of medical needs as well as oversight of the therapy plan/treatment and provide guidance as appropriate regarding the interaction of the two. 6. 24 hour rehab nursing will assist with bladder management, bowel management, safety, skin/wound care, disease management, medication administration, pain management and patient education  and help integrate therapy concepts, techniques,education, etc. 7. PT will assess and treat for/with: Lower extremity strength, range of motion, stamina, balance, functional mobility, safety, adaptive techniques and equipment, NMR, education.   Goals are: mod I for locomotion and basic mobility. 8. OT will assess and treat for/with: ADL's, functional mobility, safety, upper extremity strength, adaptive techniques and equipment, NMR, ?splinting and adaptive device, education.   Goals are: mod I to min assist. 9. SLP will assess and treat for/with: speech intelligibility, swallowing, communication, education.  Goals are: mod i to supervision. 10. Case Management and Social Worker will assess and treat for  psychological issues and discharge planning. 11. Team conference will be held weekly to assess progress toward goals and to determine barriers to discharge. 12. Patient will receive at least 3 hours of therapy per day at least 5 days per week. 13. ELOS: 10-12 days       14. Prognosis:  excellent   Medical Problem List and Plan: R-MCA infarcts with hemorrhagic conversion past tPA--embolic due to unknown sourcel 1. DVT Prophylaxis/Anticoagulation: Mechanical: Sequential compression devices, below knee Bilateral lower extremities Pharmaceutical: Lovenox 2. DJD/Pain Management:  Prn tylenol. Will add Voltaren gel additionally to help with symptoms.  3. Mood:  Noted to  have mild underlying anxiety. Team to provide ego support. LCSW to follow for support and evaluation.  4. Neuropsych: This patient is capable of making decisions on his own behalf. 5. HTN: Will monitor BP with bid checks. Continue Norvasc.  Prinizide on hold to avoid hypotension.  Resume as indicated.  6. Dyslipidemia: On lipitor daily 7. Dysphagia: D1, thin diet---needs to chew well and take time with swallowing   Meredith Staggers, MD, [facility]   11/20/2013

## 2013-11-21 NOTE — Progress Notes (Signed)
UR complete.  Shasha Buchbinder RN, MSN 

## 2013-11-21 NOTE — Progress Notes (Signed)
Patient admitted to room 4MW04 via wheelchair, escorted by nursing staff and spouse.  Patient and wife verbalized understanding of rehab process.  Appears to be in no immediate distress at this time. Brita Romp, RN

## 2013-11-21 NOTE — Interval H&P Note (Signed)
[name] [facility] was admitted today to Inpatient Rehabilitation with the diagnosis of embolic right MCA infarcts.  The patient's history has been reviewed, patient examined, and there is no change in status.  Patient continues to be appropriate for intensive inpatient rehabilitation.  I have reviewed the patient's chart and labs.  Questions were answered to the patient's satisfaction.  Kery Batzel T 11/21/2013, 8:56 PM

## 2013-11-21 NOTE — Progress Notes (Signed)
Physical Therapy Treatment Patient Details Name: Joshua Horton MRN: [id number] DOB: February 12, 1946 Today's Date: 11/21/2013 Time: 4696-2952 PT Time Calculation (min): 27 min  PT Assessment / Plan / Recommendation  History of Present Illness Joshua Horton is an 68 y.o. male who was at home with his wife this evening 11/15/2013. While speaking with his wife had acute onset difficulty with speech. Then noted weakness on his left side as well. EMS was called at that time and the patient was brought in as a code stroke. CBG 118. Initial NIHSS of 5.  Patient given TPA.   PT Comments   Patient continues to be highly motivated. Wife present throughout session today. Continue to recommend comprehensive inpatient rehab (CIR) for post-acute therapy needs.   Follow Up Recommendations  CIR     Does the patient have the potential to tolerate intense rehabilitation     Barriers to Discharge        Equipment Recommendations       Recommendations for Other Services    Frequency Min 4X/week   Progress towards PT Goals Progress towards PT goals: Progressing toward goals  Plan Current plan remains appropriate    Precautions / Restrictions Precautions Precautions: Fall Restrictions Weight Bearing Restrictions: No   Pertinent Vitals/Pain Denied pain    Mobility  Ambulation/Gait Ambulation Distance (Feet): 200 Feet Gait velocity: Patient able to practice with and without HHA this session. Worked on dynamic gait activities with Min A for staggering and to maintain balance with dynamic challenges. Patient tends to increased sway and has less LLE stability with challenges    Exercises     PT Diagnosis:    PT Problem List:   PT Treatment Interventions:     PT Goals (current goals can now be found in the care plan section)    Visit Information  Last PT Received On: 11/21/13 Assistance Needed: +1 History of Present Illness: Joshua Horton is an 68 y.o. male who was at home with his wife this  evening 11/15/2013. While speaking with his wife had acute onset difficulty with speech. Then noted weakness on his left side as well. EMS was called at that time and the patient was brought in as a code stroke. CBG 118. Initial NIHSS of 5.  Patient given TPA.    Subjective Data      Cognition  Cognition Arousal/Alertness: Awake/alert Behavior During Therapy: WFL for tasks assessed/performed Overall Cognitive Status: Within Functional Limits for tasks assessed    Balance     End of Session PT - End of Session Equipment Utilized During Treatment: Gait belt Activity Tolerance: Patient tolerated treatment well Patient left: in chair;with call bell/phone within reach Nurse Communication: Mobility status   GP     Jacqualyn Posey 11/21/2013, 9:03 AM 11/21/2013 Jacqualyn Posey PTA [phone] pager [phone] office

## 2013-11-22 ENCOUNTER — Inpatient Hospital Stay (HOSPITAL_COMMUNITY): Payer: Medicare Other

## 2013-11-22 ENCOUNTER — Inpatient Hospital Stay (HOSPITAL_COMMUNITY): Payer: Medicare Other | Admitting: Occupational Therapy

## 2013-11-22 DIAGNOSIS — M25552 Pain in left hip: Secondary | ICD-10-CM

## 2013-11-22 DIAGNOSIS — G8110 Spastic hemiplegia affecting unspecified side: Secondary | ICD-10-CM

## 2013-11-22 DIAGNOSIS — M25551 Pain in right hip: Secondary | ICD-10-CM | POA: Diagnosis present

## 2013-11-22 DIAGNOSIS — I6340 Cerebral infarction due to embolism of unspecified cerebral artery: Secondary | ICD-10-CM

## 2013-11-22 LAB — COMPREHENSIVE METABOLIC PANEL
ALT: 33 U/L (ref 0–53)
AST: 34 U/L (ref 0–37)
Albumin: 4 g/dL (ref 3.5–5.2)
Alkaline Phosphatase: 89 U/L (ref 39–117)
BILIRUBIN TOTAL: 0.7 mg/dL (ref 0.3–1.2)
BUN: 16 mg/dL (ref 6–23)
CHLORIDE: 103 meq/L (ref 96–112)
CO2: 25 mEq/L (ref 19–32)
Calcium: 9.6 mg/dL (ref 8.4–10.5)
Creatinine, Ser: 0.85 mg/dL (ref 0.50–1.35)
GFR calc Af Amer: >90 mL/min (ref >90)
GFR calc non Af Amer: 88 mL/min — ABNORMAL LOW (ref >90)
Glucose, Bld: 99 mg/dL (ref 70–99)
Potassium: 3.9 mEq/L (ref 3.7–5.3)
Sodium: 145 mEq/L (ref 137–147)
Total Protein: 7.4 g/dL (ref 6.0–8.3)

## 2013-11-22 LAB — CBC WITH DIFFERENTIAL/PLATELET
Basophils Absolute: 0 10*3/uL (ref 0.0–0.1)
Basophils Relative: 0 % (ref 0–1)
Eosinophils Absolute: 0.2 10*3/uL (ref 0.0–0.7)
Eosinophils Relative: 3 % (ref 0–5)
HCT: 46.5 % (ref 39.0–52.0)
Hemoglobin: 15.7 g/dL (ref 13.0–17.0)
Lymphocytes Relative: 32 % (ref 12–46)
Lymphs Abs: 2.1 10*3/uL (ref 0.7–4.0)
MCH: 30.3 pg (ref 26.0–34.0)
MCHC: 33.8 g/dL (ref 30.0–36.0)
MCV: 89.8 fL (ref 78.0–100.0)
MONOS PCT: 11 % (ref 3–12)
Monocytes Absolute: 0.7 10*3/uL (ref 0.1–1.0)
NEUTROS PCT: 55 % (ref 43–77)
Neutro Abs: 3.6 10*3/uL (ref 1.7–7.7)
Platelets: 251 10*3/uL (ref 150–400)
RBC: 5.18 MIL/uL (ref 4.22–5.81)
RDW: 14 % (ref 11.5–15.5)
WBC: 6.6 10*3/uL (ref 4.0–10.5)

## 2013-11-22 NOTE — Evaluation (Signed)
Physical Therapy Assessment and Plan  Patient Details  Name: Joshua Horton MRN: [id number] Date of Birth: 1946-05-12  PT Diagnosis: Difficulty walking, Hemiparesis non-dominant, Impaired sensation and Osteoarthritis bil hips Rehab Potential: Good ELOS: 7-10   Today's Date: 11/22/2013 Time: 0810-0910; [id number] Time Calculation (min): 60 min, 35 min Individual therapy  Problem List:  Patient Active Problem List   Diagnosis Date Noted  . Hip pain, bilateral--chronic 11/22/2013  . CVA (cerebral infarction) 11/21/2013  . Embolic cerebral infarction   . Unspecified cerebral artery occlusion with cerebral infarction 11/16/2013  . COPD (chronic obstructive pulmonary disease) 08/27/2013  . CAD S/P percutaneous coronary angioplasty 07/26/2013  . HTN (hypertension) 07/26/2013  . Hyperlipemia 07/26/2013  . History of tobacco use in home 07/26/2013  . Atherosclerosis of native arteries of the extremities with intermittent claudication 08/23/2012    Past Medical History:  Past Medical History  Diagnosis Date  . Coronary artery disease   . Wheezing   . Chronic cough   . Peripheral vascular disease   . Hypertension   . Hyperlipidemia   . Reflux   . COPD (chronic obstructive pulmonary disease)   . Myocardial infarction   . CHF (congestive heart failure)   . Embolic cerebral infarction     S/P IV t-PA,  post-tPA hemorrhagic transformation,clinically asymptomatic, scattered right MCA ischemic infarcts, right occipital lobe hemorrhagic infarction with focal hematoma, small volume of right subarachnoid hemorrhage    Past Surgical History:  Past Surgical History  Procedure Laterality Date  . Coronary angioplasty with stent placement  2003  . Cervical disc surgery      Dr Venetia Maxon  . Tee without cardioversion N/A 11/19/2013    Procedure: TRANSESOPHAGEAL ECHOCARDIOGRAM (TEE);  Surgeon: Wendall Stade, MD;  Location: [facility] ENDOSCOPY;  Service: Cardiovascular;  Laterality: N/A;  . Loop recorder  implant  11/19/2013    MDT LinQ implanted by Dr Graciela Husbands for cryptogenic stroke    Assessment & Plan Clinical Impression:  Joshua Horton is a 68 y.o. Left handed male with history of CAD, HTN, COPD; who was admitted on 11/16/13 with acute onset of speech difficulty and left sided weakness. Patient reported similar transient symptoms on 11/03/13. CT head without acute changes. Treated with tPA. MRI/MRA brain with scattered acute right MCA infarcts, most confluent at the posterior insula superimposed rigth occipital lobe hemorrhagic infarct with focal hematoma 30 X 19 X 13 mm, trace SAH over posterior right hemisphere, R MCA M2 segment occlusion/stenosis and suspicion of plaque or thrombus supraclinoid R- ICA causing mild to moderate stenosis. Carotid dopplers with 40-59% R--ICA stenosis and 1-39% L-ICA stenosis. 2D echo with EF 65-70% with grade 1 diastolic dysfunction and no wall abnormality. Patient transferred to CIR on 11/21/2013 .   Patient currently requires mod with mobility secondary to muscle weakness and bil hip joint pain, decreased cardiorespiratoy endurance and impaired timing and sequencing, unbalanced muscle activation and decreased coordination.  Prior to hospitalization, patient was independent  with mobility and lived with spouse Spouse in a 2 story House.  Pt and wife do not have to use 2nd story of home.    Home access is 2Stairs to enter, without rail.  Pt's wife reported that a neighbor plans to install a railing at entrance.  Patient will benefit from skilled PT intervention to maximize safe functional mobility, minimize fall risk and decrease caregiver burden for planned discharge home with intermittent assist.  Anticipate patient will benefit from follow up [facility] at discharge.  PT - End  of Session Activity Tolerance: Tolerates < 10 min activity, no significant change in vital signs Endurance Deficit:yes PT Assessment Rehab Potential: Good PT Patient demonstrates impairments in the  following area(s): Balance;Safety;Motor, activity tolerance PT Transfers Functional Problem(s): Bed Mobility;Bed to Chair;Car;Furniture PT Locomotion Functional Problem(s): Ambulation;Stairs PT Plan PT Intensity: Minimum of 1-2 x/day ,45 to 90 minutes PT Frequency: 5 out of 7 days PT Duration Estimated Length of Stay: 7-10 PT Treatment/Interventions: Ambulation/gait training;Balance/vestibular training;Discharge planning;Neuromuscular re-education;Functional mobility training;DME/adaptive equipment instruction;Patient/family education;Psychosocial support;Splinting/orthotics;UE/LE Coordination activities;UE/LE Strength taining/ROM;Therapeutic Exercise;Therapeutic Activities;Stair training;Wheelchair propulsion/positioning PT Transfers Anticipated Outcome(s): supervision basic and car PT Locomotion Anticipated Outcome(s): supervision gait x 150' controlled and home; x 200' community; up/down 5 steps 2 rails  PT Recommendation Follow Up Recommendations: Home health PT;Outpatient PT Patient destination: Home Equipment Recommended: Other (comment) (TBD)  Skilled Therapeutic Intervention  tx 1:    neuromuscular re-education via forced use, tactile and VCS for LUE use to lock w/c brakes, RUE activation and positioning for safety during bed mobility, bil LE use to propel w/c  tx 2:  neuromuscular re-education as above for balance retraining : trunk activation in sitting when reaching out of BOS in all directions, standing during external perturbations in all directions.  Pt demonstrated bil ankle and hip strategies after several reps of external perturbations.  Hamstring stretch bil in 1/2 long sitting, with min assist.  Pt SOBOE during stretching.  RLE appears longer, confirmed by pt.  Pt and wife reported that he had an asymmetrical gait PTA for several years, possibly due to hip pain or LL discrepancy.  Gait x 87', x 140' with min guard assist.  Pt drifts L with each L stance phase, but  recovers independently.   PT Evaluation Precautions/Restrictions Precautions Precautions: Fall Precaution Comments: risk for subluxation and injury to left hand due to decr ROM and sensation Restrictions Weight Bearing Restrictions: No General   Vital SignsTherapy Vitals Pt wheezed audibly intermittently during eval.  Pain- none reported  Home Living/Prior Functioning Home Living Available Help at Discharge: Family;Available 24 hours/day Type of Home: House Home Access: Stairs to enter [employer] of Steps: 2 Entrance Stairs-Rails:  (neighbor plans to install railing to enter) Home Layout: Two level (pt uses only 1st level) Prior Function  Able to Take Stairs?: Yes Driving: Yes Vocation: Retired Leisure: Hobbies-yes (Comment) (yard work, Recruitment consultant) Comments: Drives Vision/Perception  Vision - History Baseline Vision: Wears glasses only for reading Patient Visual Report: No change from baseline  Cognition Arousal/Alertness: Awake/alert Orientation Level: Oriented X4 Sensation Sensation Light Touch: Appears Intact (bil LEs) Proprioception: Appears Intact (bil LEs) Coordination Gross Motor Movements are Fluid and Coordinated: No Heel Shin Test: reduced excursion, speed and accuracy LLE Motor  Motor Motor: Hemiplegia  Mobility Bed Mobility Bed Mobility: rolling L with supervision without cues, rolling R with cues for LUE; supine > sit with supervision Transfers Transfers: Yes Sit to Stand: With upper extremity assist;From bed;4: Min guard Stand to Sit: 4: Min assist;With upper extremity assist Stand Pivot Transfers: 3: Mod assist Stand Pivot Transfer Details (indicate cue type and reason): stand step Locomotion  Ambulation Ambulation: Yes Ambulation/Gait Assistance: 4: Min assist Ambulation Distance (Feet): 100 Feet Assistive device: None Gait Gait: Yes Gait Pattern: Step-through pattern;Decreased hip/knee flexion - left;Lateral hip instability;Narrow  base of support;Decreased trunk rotation;Lateral trunk lean to left;Decreased dorsiflexion - left Gait velocity: decreased Stairs / Additional Locomotion Stairs: Yes Stairs Assistance: 3: Mod assist Stairs Assistance Details: Manual facilitation for weight bearing;Manual facilitation for weight shifting Stairs  Assistance Details (indicate cue type and reason): mod assist when descending; mod assist to advance LUE Stair Management Technique: Two rails Number of Stairs: 5 Height of Stairs: 7 Wheelchair Mobility Wheelchair Mobility: Yes Wheelchair Assistance: 2: Max Technical sales engineer Details: Manual facilitation for placement;Manual facilitation for [employer] bearing Wheelchair Propulsion: Both upper extremities Distance: 5  Trunk/Postural Assessment  Cervical Assessment Cervical Assessment: Within Scientist, physiological Assessment: Within Functional Limits Lumbar Assessment Lumbar Assessment: leans L in standing; LLE appears shorter, confirmed by pt Postural Control Postural Control: Deficits on evaluation Protective Responses: LUE impaired  Balance Balance Balance Assessed: Yes Static Sitting Balance Static Sitting - Balance Support: No upper extremity supported;Feet supported Static Sitting - Level of Assistance: 5: Stand by assistance Dynamic Sitting Balance Dynamic Sitting - Level of Assistance: 5: Stand by assistance Static Standing Balance Static Standing - Balance Support: No upper extremity supported Static Standing - Level of Assistance: 5: Stand by assistance Dynamic Standing Balance Dynamic Standing - Level of Assistance: 4: Min assist Extremity Assessment  RUE Assessment RUE Assessment: Within Functional Limits LUE Assessment LUE Assessment: Exceptions to [facility] LUE AROM (degrees) Left Shoulder Flexion: 170 Degrees Left Elbow Flexion: 90 Left Wrist Extension: 5 Degrees Left Composite Finger Flexion: 25% Left Thumb Opposition:  Digit 2 (50 % of the way) LUE Strength Left Elbow Extension: 3+/5 RLE Assessment RLE Assessment: Within Functional Limits; tight hamstrings LLE Assessment LLE Assessment: Exceptions to [facility] Tight hamstrings LLE Strength LLE Overall Strength Comments: in sitting, grossly 4+/5 hip flexion, knee flex/ext, ankle DF  FIM:  FIM - Locomotion: Wheelchair Distance: 5 FIM - Locomotion: Ambulation Ambulation/Gait Assistance: 4: Min assist   Refer to Care Plan for Long Term Goals  Recommendations for other services: None  Discharge Criteria: Patient will be discharged from PT if patient refuses treatment 3 consecutive times without medical reason, if treatment goals not met, if there is a change in medical status, if patient makes no progress towards goals or if patient is discharged from hospital.  The above assessment, treatment plan, treatment alternatives and goals were discussed and mutually agreed upon: by patient and by family  Aahan Marques 11/22/2013, 12:18 PM

## 2013-11-22 NOTE — Evaluation (Signed)
Speech Language Pathology Assessment and Plan  Patient Details  Name: Joshua Horton MRN: [id number] Date of Birth: 09/07/1946  SLP Diagnosis: Dysarthria;Dysphagia  Rehab Potential: Excellent ELOS: 7-10 days   Today's Date: 11/22/2013 Time: 0973-5329 Time Calculation (min): 60 min  Problem List:  Patient Active Problem List   Diagnosis Date Noted  . Hip pain, bilateral--chronic 11/22/2013  . CVA (cerebral infarction) 11/21/2013  . Embolic cerebral infarction   . Unspecified cerebral artery occlusion with cerebral infarction 11/16/2013  . COPD (chronic obstructive pulmonary disease) 08/27/2013  . CAD S/P percutaneous coronary angioplasty 07/26/2013  . HTN (hypertension) 07/26/2013  . Hyperlipemia 07/26/2013  . History of tobacco use in home 07/26/2013  . Atherosclerosis of native arteries of the extremities with intermittent claudication 08/23/2012   Past Medical History:  Past Medical History  Diagnosis Date  . Coronary artery disease   . Wheezing   . Chronic cough   . Peripheral vascular disease   . Hypertension   . Hyperlipidemia   . Reflux   . COPD (chronic obstructive pulmonary disease)   . Myocardial infarction   . CHF (congestive heart failure)   . Embolic cerebral infarction     S/P IV t-PA,  post-tPA hemorrhagic transformation,clinically asymptomatic, scattered right MCA ischemic infarcts, right occipital lobe hemorrhagic infarction with focal hematoma, small volume of right subarachnoid hemorrhage    Past Surgical History:  Past Surgical History  Procedure Laterality Date  . Coronary angioplasty with stent placement  2003  . Cervical disc surgery      Dr Vertell Limber  . Tee without cardioversion N/A 11/19/2013    Procedure: TRANSESOPHAGEAL ECHOCARDIOGRAM (TEE);  Surgeon: Josue Hector, MD;  Location: [facility] ENDOSCOPY;  Service: Cardiovascular;  Laterality: N/A;  . Loop recorder implant  11/19/2013    MDT LinQ implanted by Dr Caryl Comes for cryptogenic stroke    Assessment  / Plan / Recommendation Clinical Impression  Pt is a 68 y.o. Left handed male with h/o CAD, HTN, COPD who was admitted on 11/16/13 with acute onset of speech difficulty and left sided weakness. Pt reported similar transient symptoms on 11/03/13. CT head without acute changes. Treated with tPA. MRI/MRA brain with scattered acute right MCA infarcts, most confluent at the posterior insula superimposed rigth occipital lobe hemorrhagic infarct with focal hematoma 30 X 19 X 13 mm, trace SAH over posterior right hemisphere, R MCA M2 segment occlusion/stenosis and suspicion of plaque or thrombus supraclinoid R- ICA causing mild to moderate stenosis. Carotid dopplers with 40-59% R--ICA stenosis and 1-39% L-ICA stenosis. 2D echo with EF 65-70% with grade 1 diastolic dysfunction and no wall abnormality. MBS done on 01/03 showing multifactorial dysphagia with delayed transit and moderate residue therefore placed on D1, thin liquids. Pt with post tPA hemorrhagic transformation of embolic stroke due to unknown source. TEE done revealing EF 65% and negative for ASD, PFO or LAA thrombus. Loop recorder placed on 11/19/13. NO blood thinner for now--neurology recommends repeat CCT in a week to decide on resuming low dose AS and cleared to use lovenox for DVT prophylaxis. Therapies ongoing and rehab team recommended CIR for progression. Pt was ultimately admitted 1/7, with SLP bedside-swallow and cognitive-linguistic evals completed 1/8. Pt appears to be at his baseline for cognitive-linguistic skills. He does present with moderate-severe impairments with lingual/labial strength and ROM, which impacts his speech intelligibility and oral control/manipulation for bolus formation and transit. Pt will benefit from SLP services to maximize functional communication and swallowing safety prior to discharge home.  SLP Assessment  Patient will need skilled Speech Lanaguage Pathology Services during CIR admission    Recommendations  Diet  Recommendations: Dysphagia 1 (Puree);Thin liquid Liquid Administration via: Cup;No straw Medication Administration: Crushed with puree Supervision: Patient able to self feed;Full supervision/cueing for compensatory strategies;Trained caregiver to feed patient Compensations: Slow rate;Small sips/bites;Check for pocketing;Multiple dry swallows after each bite/sip Postural Changes and/or Swallow Maneuvers: Seated upright 90 degrees Oral Care Recommendations: Oral care before and after PO Patient destination: Home Follow up Recommendations: Home Health SLP;24 hour supervision/assistance Equipment Recommended: None recommended by SLP    SLP Frequency 5 out of 7 days   SLP Treatment/Interventions Dysphagia/aspiration precaution training;Speech/Language facilitation;Oral motor exercises;Patient/family education;Therapeutic Activities;Therapeutic Exercise    Pain Pain Assessment Pain Assessment: No/denies pain Prior Functioning Cognitive/Linguistic Baseline: Within functional limits Type of Home: House  Lives With: Spouse Available Help at Discharge: Family;Available 24 hours/day Vocation: Retired  Industrial/product designer Term Goals: Week 1: SLP Short Term Goal 1 (Week 1): Pt will consume Dys 1 textures and thin liquids with Supervision level cues for utilization of safe swallowing strategies SLP Short Term Goal 2 (Week 1): Pt will demonstrate adequate masticaiton and oral clearance of Dys 2 textures with supervision level cues SLP Short Term Goal 3 (Week 1): Pt will perform oral motor exercises with Min cues SLP Short Term Goal 4 (Week 1): Pt will utilize speech intelligibility strategies at the phrase level with Min cues  See FIM for current functional status Refer to Care Plan for Long Term Goals  Recommendations for other services: None  Discharge Criteria: Patient will be discharged from SLP if patient refuses treatment 3 consecutive times without medical reason, if treatment goals not met, if there is  a change in medical status, if patient makes no progress towards goals or if patient is discharged from hospital.  The above assessment, treatment plan, treatment alternatives and goals were discussed and mutually agreed upon: by patient and by family  Germain Osgood, M.A. CCC-SLP [phone]   Germain Osgood 11/22/2013, 11:12 AM

## 2013-11-22 NOTE — Progress Notes (Signed)
Patient information reviewed and entered into eRehab system by Tokiko Diefenderfer, RN, CRRN, PPS Coordinator.  Information including medical coding and functional independence measure will be reviewed and updated through discharge.     Per nursing patient was given "Data Collection Information Summary for Patients in Inpatient Rehabilitation Facilities with attached "Privacy Act Statement-Health Care Records" upon admission.  

## 2013-11-22 NOTE — Evaluation (Signed)
Occupational Therapy Assessment and Plan  Patient Details  Name: Joshua Horton MRN: [id number] Date of Birth: 1946/11/07  OT Diagnosis: hemiplegia affecting dominant side Rehab Potential: Rehab Potential: Good ELOS: 7-10 days   Today's Date: 11/22/2013 Time: 7782-4235 Time Calculation (min): 60 min  Problem List:  Patient Active Problem List   Diagnosis Date Noted  . Hip pain, bilateral--chronic 11/22/2013  . CVA (cerebral infarction) 11/21/2013  . Embolic cerebral infarction   . Unspecified cerebral artery occlusion with cerebral infarction 11/16/2013  . COPD (chronic obstructive pulmonary disease) 08/27/2013  . CAD S/P percutaneous coronary angioplasty 07/26/2013  . HTN (hypertension) 07/26/2013  . Hyperlipemia 07/26/2013  . History of tobacco use in home 07/26/2013  . Atherosclerosis of native arteries of the extremities with intermittent claudication 08/23/2012    Past Medical History:  Past Medical History  Diagnosis Date  . Coronary artery disease   . Wheezing   . Chronic cough   . Peripheral vascular disease   . Hypertension   . Hyperlipidemia   . Reflux   . COPD (chronic obstructive pulmonary disease)   . Myocardial infarction   . CHF (congestive heart failure)   . Embolic cerebral infarction     S/P IV t-PA,  post-tPA hemorrhagic transformation,clinically asymptomatic, scattered right MCA ischemic infarcts, right occipital lobe hemorrhagic infarction with focal hematoma, small volume of right subarachnoid hemorrhage    Past Surgical History:  Past Surgical History  Procedure Laterality Date  . Coronary angioplasty with stent placement  2003  . Cervical disc surgery      Dr Venetia Maxon  . Tee without cardioversion N/A 11/19/2013    Procedure: TRANSESOPHAGEAL ECHOCARDIOGRAM (TEE);  Surgeon: Wendall Stade, MD;  Location: [facility] ENDOSCOPY;  Service: Cardiovascular;  Laterality: N/A;  . Loop recorder implant  11/19/2013    MDT LinQ implanted by Dr Graciela Husbands for cryptogenic  stroke    Assessment & Plan Clinical Impression:  WILBURN KEIR is a 68 y.o. Left handed male with history of CAD, HTN, COPD; who was admitted on 11/16/13 with acute onset of speech difficulty and left sided weakness. Patient reported similar transient symptoms on 11/03/13. CT head without acute changes. Treated with tPA. MRI/MRA brain with scattered acute right MCA infarcts, most confluent at the posterior insula superimposed rigth occipital lobe hemorrhagic infarct with focal hematoma 30 X 19 X 13 mm, trace SAH over posterior right hemisphere, R MCA M2 segment occlusion/stenosis and suspicion of plaque or thrombus supraclinoid R- ICA causing mild to moderate stenosis. Carotid dopplers with 40-59% R--ICA stenosis and 1-39% L-ICA stenosis. 2D echo with EF 65-70% with grade 1 diastolic dysfunction and no wall abnormality.   MBS done on 01/03 showing multifactorial dysphagia with delayed transit and moderate residue therefore placed on D1, thin liquids. 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 TEE done revealing EF 65% and negative for ASD, PFO or LAA thrombus. Loop recorder placed by Dr. Graciela Husbands on 11/19/13. NO blood thinner for now--neurology recommends repeat CCT in a week to decide on resuming low dose AS and cleared to use lovenox for DVT prophylaxis.  Therapies ongoing and rehab team recommended CIR for progression  Patient transferred to CIR on 11/21/2013 .    Patient currently requires total with LB dressing and mod assist with bathing and UB dressing with basic self-care skills secondary to abnormal tone and decreased standing balance and hemiplegia.  Prior to hospitalization, patient was independent with self care (except for tying his shoes.  Patient will benefit from skilled intervention to increase independence with basic self-care skills prior to discharge home with care partner.  Anticipate patient will require minimal physical assistance and  follow up outpatient.  OT - End of Session Activity Tolerance: Endurance does not limit participation in activity Endurance Deficit: No OT Assessment Rehab Potential: Good OT Patient demonstrates impairments in the following area(s): Balance;Motor;Sensory;Safety OT Basic ADL's Functional Problem(s): Eating;Grooming;Bathing;Dressing;Toileting OT Transfers Functional Problem(s): Toilet;Tub/Shower OT Additional Impairment(s): Fuctional Use of Upper Extremity OT Plan OT Intensity: Minimum of 1-2 x/day, 45 to 90 minutes OT Frequency: 5 out of 7 days OT Duration/Estimated Length of Stay: 7-10 days OT Treatment/Interventions: Balance/vestibular training;Discharge planning;DME/adaptive equipment instruction;Functional mobility training;Neuromuscular re-education;Patient/family education;Self Care/advanced ADL retraining;Therapeutic Activities;Therapeutic Exercise;UE/LE Strength taining/ROM;UE/LE Coordination activities OT Self Feeding Anticipated Outcome(s): set up OT Basic Self-Care Anticipated Outcome(s): supervision (except for A to tie shoes, unless pt can use elastic laces) OT Toileting Anticipated Outcome(s): supervision OT Bathroom Transfers Anticipated Outcome(s): supervision OT Recommendation Patient destination: Home Follow Up Recommendations: Outpatient OT Equipment Recommended: Tub/shower seat   Skilled Therapeutic Intervention Pt seen for the initial evaluation and ADL retraining to include shower and dressing. Pt was able to ambulate in and out of bathroom and shower with steady HHA. He demonstrated good sitting and fair standing balance while reaching out of BOS. He has difficulty with LB dressing for reaching to his feet.  His main concern is L hand movement. Once self care was completed pt worked on L grasping a/arom with towel scrunches and grasping cones.  Pt's wife observed and returned demonstrated the exercises. Encouraged them to work on the exercises this afternoon. Pt  resting in recliner with call light at the end of the session.  OT Evaluation Precautions/Restrictions  Precautions Precautions: Fall Precaution Comments: risk for subluxation and injury to left hand due to decr ROM and sensation Restrictions Weight Bearing Restrictions: No      Pain Pain Assessment Pain Assessment: No/denies pain Home Living/Prior Functioning Home Living Available Help at Discharge: Family;Available 24 hours/day Type of Home: House Home Access: Stairs to enter [employer] of Steps: 2 Entrance Stairs-Rails:  (neighbor plans to install railing to enter) Home Layout: Two level (pt uses only 1st level)  Lives With: Spouse Prior Function  Able to Take Stairs?: Yes Driving: Yes Vocation: Retired Leisure: Hobbies-yes (Comment) (yard work, TV) Comments: Drives ADL  refer to FIM  Vision/Perception  Vision - History Baseline Vision: Wears glasses only for reading Patient Visual Report: No change from baseline Vision - Assessment Vision Assessment: Vision tested Ocular Range of Motion: Within Functional Limits Tracking/Visual Pursuits: Able to track stimulus in all quads without difficulty Perception Perception: Within Functional Limits Praxis Praxis: Intact  Cognition Overall Cognitive Status: Within Functional Limits for tasks assessed (appears to be at baseline) Arousal/Alertness: Awake/alert Orientation Level: Oriented X4 Sensation Sensation Light Touch: Impaired by gross assessment (in L hand and UE) Stereognosis: Impaired by gross assessment Hot/Cold: Not tested Proprioception: Appears Intact (bil LEs) Coordination Gross Motor Movements are Fluid and Coordinated: No Fine Motor Movements are Fluid and Coordinated: No Finger Nose Finger Test: Pt is not able to fully flex elbow to reach nose. Heel Shin Test: reduced excursion, speed and accuracy LLE Motor  Motor Motor: Hemiplegia Mobility  Bed Mobility Bed Mobility: Not  assessed Transfers Sit to Stand: With upper extremity assist;From bed;4: Min guard Stand to Sit: 4: Min assist;With upper extremity assist  Trunk/Postural Assessment  Cervical Assessment Cervical Assessment: Within Functional Limits Thoracic Assessment Thoracic  Assessment: Within Functional Limits Lumbar Assessment Lumbar Assessment: Within Functional Limits Postural Control Postural Control: Deficits on evaluation Protective Responses: LUE impaired  Balance Balance Balance Assessed: Yes Static Sitting Balance Static Sitting - Balance Support: No upper extremity supported;Feet supported Static Sitting - Level of Assistance: 5: Stand by assistance Dynamic Sitting Balance Dynamic Sitting - Level of Assistance: 5: Stand by assistance Static Standing Balance Static Standing - Balance Support: No upper extremity supported Static Standing - Level of Assistance: 5: Stand by assistance Dynamic Standing Balance Dynamic Standing - Level of Assistance: 4: Min assist Extremity/Trunk Assessment RUE Assessment RUE Assessment: Within Functional Limits LUE Assessment LUE Assessment: Exceptions to WFL LUE AROM (degrees) Left Shoulder Flexion: 170 Degrees Left Elbow Flexion: 90 Left Wrist Extension: 5 Degrees Left Composite Finger Flexion: 25% Left Thumb Opposition: Digit 2 (50 % of the way) LUE Strength Left Elbow Extension: 3+/5  FIM:  FIM - Eating Eating Activity: 4: Helper checks for pocketed food;5: Set-up assist for open containers;5: Needs verbal cues/supervision FIM - Grooming Grooming Steps: Wash, rinse, dry face;Oral care, brush teeth, clean dentures;Brush, comb hair;Wash, rinse, dry hands Grooming: 5: Set-up assist to obtain items FIM - Bathing Bathing Steps Patient Completed: Chest;Abdomen;Front perineal area;Buttocks;Right upper leg;Left upper leg;Right lower leg (including foot) Bathing: 3: Mod-Patient completes 5-7 67f 10 parts or 50-74% FIM - Upper Body  Dressing/Undressing Upper body dressing/undressing steps patient completed: Thread/unthread right sleeve of pullover shirt/dresss;Put head through opening of pull over shirt/dress;Pull shirt over trunk;Pull shirt around back of front closure shirt/dress;Thread/unthread right sleeve of front closure shirt/dress Upper body dressing/undressing: 3: Mod-Patient completed 50-74% of tasks FIM - Lower Body Dressing/Undressing Lower body dressing/undressing: 1: Total-Patient completed less than 25% of tasks FIM - Systems developer Devices: Tub transfer bench;Walk in shower;Grab bars Tub/shower Transfers: 4-Into Tub/Shower: Min A (steadying Pt. > 75%/lift 1 leg);4-Out of Tub/Shower: Min A (steadying Pt. > 75%/lift 1 leg)   Refer to Care Plan for Long Term Goals  Recommendations for other services: None  Discharge Criteria: Patient will be discharged from OT if patient refuses treatment 3 consecutive times without medical reason, if treatment goals not met, if there is a change in medical status, if patient makes no progress towards goals or if patient is discharged from hospital.  The above assessment, treatment plan, treatment alternatives and goals were discussed and mutually agreed upon: by patient and by family  SAGUIER,JULIA 11/22/2013, 12:06 PM

## 2013-11-22 NOTE — Progress Notes (Signed)
68 y.o. Left handed male with history of CAD, HTN, COPD; who was admitted on 11/16/13 with acute onset of speech difficulty and left sided weakness. Patient reported similar transient symptoms on 11/03/13. CT head without acute changes. Treated with tPA. MRI/MRA brain with scattered acute right MCA infarcts, most confluent at the posterior insula superimposed rigth occipital lobe hemorrhagic infarct with focal hematoma 30 X 19 X 13 mm, trace SAH over posterior right hemisphere, R MCA M2 segment occlusion/stenosis and suspicion of plaque or thrombus supraclinoid R- ICA causing mild to moderate stenosis. Carotid dopplers with 40-59% R--ICA stenosis and 1-39% L-ICA stenosis. 2D echo with EF 65-70% with grade 1 diastolic dysfunction and no wall abnormality.  MBS done on 01/03 showing multifactorial dysphagia with delayed transit and moderate residue therefore placed on D1, thin liquids. 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 TEE done revealing EF 65% and negative for ASD, PFO or LAA thrombus. Loop recorder placed by Dr. Caryl Comes on 11/19/13. NO blood thinner for now--neurology recommends repeat CCT in a week to decide on resuming low dose AS and cleared to use lovenox for DVT prophylaxis.   Cold last noc otherwise no issues                 Review of Systems - Negative except constipation and choking on food  Subjective/Complaints:   Objective: Vital Signs: Blood pressure 155/77, pulse 81, temperature 98.5 F (36.9 C), temperature source Oral, resp. rate 16, SpO2 97.00%. No results found. Results for orders placed during the hospital encounter of 11/21/13 (from the past 72 hour(s))  CBC WITH DIFFERENTIAL     Status: None   Collection Time    11/22/13  6:03 AM      Result Value Range   WBC 6.6  4.0 - 10.5 K/uL   RBC 5.18  4.22 - 5.81 MIL/uL   Hemoglobin 15.7  13.0 - 17.0 g/dL   HCT 46.5  39.0 - 52.0 %   MCV 89.8  78.0 - 100.0 fL   MCH 30.3  26.0 -  34.0 pg   MCHC 33.8  30.0 - 36.0 g/dL   RDW 14.0  11.5 - 15.5 %   Platelets 251  150 - 400 K/uL   Neutrophils Relative % 55  43 - 77 %   Neutro Abs 3.6  1.7 - 7.7 K/uL   Lymphocytes Relative 32  12 - 46 %   Lymphs Abs 2.1  0.7 - 4.0 K/uL   Monocytes Relative 11  3 - 12 %   Monocytes Absolute 0.7  0.1 - 1.0 K/uL   Eosinophils Relative 3  0 - 5 %   Eosinophils Absolute 0.2  0.0 - 0.7 K/uL   Basophils Relative 0  0 - 1 %   Basophils Absolute 0.0  0.0 - 0.1 K/uL  COMPREHENSIVE METABOLIC PANEL     Status: Abnormal   Collection Time    11/22/13  6:03 AM      Result Value Range   Sodium 145  137 - 147 mEq/L   Potassium 3.9  3.7 - 5.3 mEq/L   Chloride 103  96 - 112 mEq/L   CO2 25  19 - 32 mEq/L   Glucose, Bld 99  70 - 99 mg/dL   BUN 16  6 - 23 mg/dL   Creatinine, Ser 0.85  0.50 - 1.35 mg/dL   Calcium 9.6  8.4 - 10.5 mg/dL   Total Protein 7.4  6.0 -  8.3 g/dL   Albumin 4.0  3.5 - 5.2 g/dL   AST 34  0 - 37 U/L   ALT 33  0 - 53 U/L   Alkaline Phosphatase 89  39 - 117 U/L   Total Bilirubin 0.7  0.3 - 1.2 mg/dL   GFR calc non Af Amer 88 (*) >90 mL/min   GFR calc Af Amer >90  >90 mL/min   Comment: (NOTE)     The eGFR has been calculated using the CKD EPI equation.     This calculation has not been validated in all clinical situations.     eGFR's persistently <90 mL/min signify possible Chronic Kidney     Disease.     HEENT: poor dentition Cardio: RRR and no murmur Resp: CTA B/L and unlabored GI: BS positive, Distention and NT Extremity:  No Edema Skin:   Intact Neuro: Alert/Oriented, Flat, Cranial Nerve Abnormalities Left central 7, Abnormal Sensory reduced Left hand to LT, Abnormal Motor 3-/5 Left Delt, bi, tri, grip, 4/5 L HF, KE, ADF, Abnormal FMC Ataxic/ dec FMC and Dysarthric Musc/Skel:  Normal Gen NAD   Assessment/Plan: 1. Functional deficits secondary to R MCA distribution infarct , Left HP and Hemisensopry def, no severe neglect which require 3+ hours per day of  interdisciplinary therapy in a comprehensive inpatient rehab setting. Physiatrist is providing close team supervision and 24 hour management of active medical problems listed below. Physiatrist and rehab team continue to assess barriers to discharge/monitor patient progress toward functional and medical goals. FIM:                   Comprehension Comprehension Mode: Auditory Comprehension: 7-Follows complex conversation/direction: With no assist  Expression Expression Mode: Verbal Expression: 6-Expresses complex ideas: With extra time/assistive device  Social Interaction Social Interaction: 7-Interacts appropriately with others - No medications needed.  Problem Solving Problem Solving: 7-Solves complex problems: Recognizes & self-corrects  Memory Memory: 6-More than reasonable amt of time    LOS (Days) 1 A FACE TO FACE EVALUATION WAS PERFORMED Medical Problem List and Plan:  R-MCA infarcts with hemorrhagic conversion past tPA--embolic due to unknown sourcel  1. DVT Prophylaxis/Anticoagulation: Mechanical: Sequential compression devices, below knee Bilateral lower extremities  Pharmaceutical: Lovenox  2. DJD/Pain Management: Prn tylenol. Will add Voltaren gel additionally to help with symptoms.  3. Mood: Noted to have mild underlying anxiety. Team to provide ego support. LCSW to follow for support and evaluation.  4. Neuropsych: This patient is capable of making decisions on his own behalf.  5. HTN: Will monitor BP with bid checks. Continue Norvasc. Prinizide on hold to avoid hypotension. Resume as indicated.  6. Dyslipidemia: On lipitor daily  7. Dysphagia: D1, thin diet---needs to chew well and take time with swallowing  Braylea Brancato E 11/22/2013, 8:38 AM

## 2013-11-23 ENCOUNTER — Inpatient Hospital Stay (HOSPITAL_COMMUNITY): Payer: Medicare Other | Admitting: Occupational Therapy

## 2013-11-23 ENCOUNTER — Inpatient Hospital Stay (HOSPITAL_COMMUNITY): Payer: Medicare Other | Admitting: *Deleted

## 2013-11-23 ENCOUNTER — Inpatient Hospital Stay (HOSPITAL_COMMUNITY): Payer: Medicare Other | Admitting: Speech Pathology

## 2013-11-23 NOTE — Progress Notes (Signed)
Occupational Therapy Session Note  Patient Details  Name: Joshua Horton MRN: [id number] Date of Birth: 1946-06-12  Today's Date: 11/23/2013 Time: 0800-0900 and 1110-1140 Time Calculation (min): 60 min and 30 min  Short Term Goals: No short term goals set  Skilled Therapeutic Interventions/Progress Updates:    Visit 1: No c/o pain. 1:1 Pt seen for ADL retraining of bathing at shower level and dressing with a focus on dynamic balance, adaptive techniques, use of AE and LUE active movement. Pt's wife present and participated in therapies. Pt sat to EOB without A using rails and completed a few BUE arom exercises. He ambulated to shower and stood to remove underwear over feet using one hand support on bar and he used bath mit to wash his RUE actively using L arm. Pt was able to wash feet by crossing one foot over knee with assist to keep L foot stabilized on R knee. He donned clothing over feet by crossing legs, used sock aid with mod a and shoe horn with min A with elastic laces.  Pt ambulated to sink to groom with set up. Resting in chair at end of session with call light in reach.  Visit 2: No c/o pain. Pt ambulated to therapy gym to work on LUE arom with weight bearing, sensory stimulation with vibration, a/arom, and PNF. Pt has improved motor control to maintain wrist in neutral and flex his elbow to bring hand to chin. He is not yet able to smoothly control to bring to mouth for self feeding with adapted utensils. Pt had slightly more active movement in finger and thumb (continues to be trace).  He is only able to squeeze a soft sponge, he is not able to loosely grasp large pegs.  Pt was given a soft sponge block to work on finger flexion over the weekend.Pt ambulated to room with steady assist and sat in arm chair. Pt's wife was in the room and call light in reach.    Therapy Documentation Precautions:  Precautions Precautions: Fall Precaution Comments: risk for subluxation and injury to  left hand due to decr ROM and sensation Restrictions Weight Bearing Restrictions: No     ADL:  See FIM for current functional status  Therapy/Group: Individual Therapy  Wadie Mattie 11/23/2013, 9:23 AM

## 2013-11-23 NOTE — Progress Notes (Signed)
68 y.o. Left handed male with history of CAD, HTN, COPD; who was admitted on 11/16/13 with acute onset of speech difficulty and left sided weakness. Patient reported similar transient symptoms on 11/03/13. CT head without acute changes. Treated with tPA. MRI/MRA brain with scattered acute right MCA infarcts, most confluent at the posterior insula superimposed rigth occipital lobe hemorrhagic infarct with focal hematoma 30 X 19 X 13 mm, trace SAH over posterior right hemisphere, R MCA M2 segment occlusion/stenosis and suspicion of plaque or thrombus supraclinoid R- ICA causing mild to moderate stenosis. Carotid dopplers with 40-59% R--ICA stenosis and 1-39% L-ICA stenosis. 2D echo with EF 65-70% with grade 1 diastolic dysfunction and no wall abnormality.  MBS done on 01/03 showing multifactorial dysphagia with delayed transit and moderate residue therefore placed on D1, thin liquids. 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 TEE done revealing EF 65% and negative for ASD, PFO or LAA thrombus. Loop recorder placed by Dr. Caryl Comes on 11/19/13. NO blood thinner for now--neurology recommends repeat CCT in a week to decide on resuming low dose AS and cleared to use lovenox for DVT prophylaxis.                    Review of Systems - Negative except constipation and choking on food  Subjective/Complaints: Discussed with OT, pt showing little neglect during functional activities Had left hand pain that improved with repositioning  Objective: Vital Signs: Blood pressure 153/77, pulse 86, temperature 97.7 F (36.5 C), temperature source Oral, resp. rate 18, SpO2 94.00%. No results found. Results for orders placed during the hospital encounter of 11/21/13 (from the past 72 hour(s))  CBC WITH DIFFERENTIAL     Status: None   Collection Time    11/22/13  6:03 AM      Result Value Range   WBC 6.6  4.0 - 10.5 K/uL   RBC 5.18  4.22 - 5.81 MIL/uL   Hemoglobin 15.7   13.0 - 17.0 g/dL   HCT 46.5  39.0 - 52.0 %   MCV 89.8  78.0 - 100.0 fL   MCH 30.3  26.0 - 34.0 pg   MCHC 33.8  30.0 - 36.0 g/dL   RDW 14.0  11.5 - 15.5 %   Platelets 251  150 - 400 K/uL   Neutrophils Relative % 55  43 - 77 %   Neutro Abs 3.6  1.7 - 7.7 K/uL   Lymphocytes Relative 32  12 - 46 %   Lymphs Abs 2.1  0.7 - 4.0 K/uL   Monocytes Relative 11  3 - 12 %   Monocytes Absolute 0.7  0.1 - 1.0 K/uL   Eosinophils Relative 3  0 - 5 %   Eosinophils Absolute 0.2  0.0 - 0.7 K/uL   Basophils Relative 0  0 - 1 %   Basophils Absolute 0.0  0.0 - 0.1 K/uL  COMPREHENSIVE METABOLIC PANEL     Status: Abnormal   Collection Time    11/22/13  6:03 AM      Result Value Range   Sodium 145  137 - 147 mEq/L   Potassium 3.9  3.7 - 5.3 mEq/L   Chloride 103  96 - 112 mEq/L   CO2 25  19 - 32 mEq/L   Glucose, Bld 99  70 - 99 mg/dL   BUN 16  6 - 23 mg/dL   Creatinine, Ser 0.85  0.50 - 1.35 mg/dL   Calcium 9.6  8.4 - 10.5 mg/dL   Total Protein 7.4  6.0 - 8.3 g/dL   Albumin 4.0  3.5 - 5.2 g/dL   AST 34  0 - 37 U/L   ALT 33  0 - 53 U/L   Alkaline Phosphatase 89  39 - 117 U/L   Total Bilirubin 0.7  0.3 - 1.2 mg/dL   GFR calc non Af Amer 88 (*) >90 mL/min   GFR calc Af Amer >90  >90 mL/min   Comment: (NOTE)     The eGFR has been calculated using the CKD EPI equation.     This calculation has not been validated in all clinical situations.     eGFR's persistently <90 mL/min signify possible Chronic Kidney     Disease.     HEENT: poor dentition Cardio: RRR and no murmur Resp: CTA B/L and unlabored GI: BS positive, Distention and NT Extremity:  No Edema Skin:   Intact Neuro: Alert/Oriented, Flat, Cranial Nerve Abnormalities Left central 7, Abnormal Sensory reduced Left hand to LT, Abnormal Motor 3-/5 Left Delt, bi, tri, grip, 4/5 L HF, KE, ADF, Abnormal FMC Ataxic/ dec FMC and Dysarthric Musc/Skel:  Normal Gen NAD   Assessment/Plan: 1. Functional deficits secondary to R MCA distribution infarct  , Left HP and Hemisensopry def, no severe neglect which require 3+ hours per day of interdisciplinary therapy in a comprehensive inpatient rehab setting. Physiatrist is providing close team supervision and 24 hour management of active medical problems listed below. Physiatrist and rehab team continue to assess barriers to discharge/monitor patient progress toward functional and medical goals. FIM: FIM - Bathing Bathing Steps Patient Completed: Chest;Abdomen;Front perineal area;Buttocks;Right upper leg;Left upper leg;Right lower leg (including foot) Bathing: 3: Mod-Patient completes 5-7 24f10 parts or 50-74%  FIM - Upper Body Dressing/Undressing Upper body dressing/undressing steps patient completed: Thread/unthread right sleeve of pullover shirt/dresss;Put head through opening of pull over shirt/dress;Pull shirt over trunk;Pull shirt around back of front closure shirt/dress;Thread/unthread right sleeve of front closure shirt/dress Upper body dressing/undressing: 3: Mod-Patient completed 50-74% of tasks FIM - Lower Body Dressing/Undressing Lower body dressing/undressing: 1: Total-Patient completed less than 25% of tasks        FIM - Bed/Chair Transfer Bed/Chair Transfer: 3: Bed > Chair or W/C: Mod A (lift or lower assist);5: Supine > Sit: Supervision (verbal cues/safety issues)  FIM - Locomotion: Wheelchair Distance: 5 Locomotion: Wheelchair: 1: Travels less than 50 ft with maximal assistance (Pt: 25 - 49%) FIM - Locomotion: Ambulation Ambulation/Gait Assistance: 4: Min assist Locomotion: Ambulation: 2: Travels 50 - 149 ft with minimal assistance (Pt.>75%)  Comprehension Comprehension Mode: Auditory Comprehension: 7-Follows complex conversation/direction: With no assist  Expression Expression Mode: Verbal Expression: 4-Expresses basic 75 - 89% of the time/requires cueing 10 - 24% of the time. Needs helper to occlude trach/needs to repeat words.  Social Interaction Social  Interaction: 4-Interacts appropriately 75 - 89% of the time - Needs redirection for appropriate language or to initiate interaction.  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 DVT Prophylaxis/Anticoagulation: Mechanical: Sequential compression devices, below knee Bilateral lower extremities  Pharmaceutical: Lovenox  2. DJD/Pain Management: Prn tylenol. Will add Voltaren gel additionally to help with symptoms.  3. Mood: Noted to have mild underlying anxiety. Team to provide ego support. LCSW to follow for  support and evaluation.  4. Neuropsych: This patient is capable of making decisions on his own behalf.  5. HTN: Will monitor BP with bid checks. Continue Norvasc. Prinizide on hold to avoid hypotension. Resume as indicated.  6. Dyslipidemia: On lipitor daily  7. Dysphagia: D1, thin diet---needs to chew well and take time with swallowing  LOS (Days) 2 A FACE TO FACE EVALUATION WAS PERFORMED   Joshua Horton 11/23/2013, 8:03 AM

## 2013-11-23 NOTE — Progress Notes (Signed)
Speech Language Pathology Daily Session Note  Patient Details  Name: Joshua Horton MRN: [id number] Date of Birth: 1946-02-08  Today's Date: 11/23/2013 Time: 6948-5462 Time Calculation (min): 40 min  Short Term Goals: Week 1: SLP Short Term Goal 1 (Week 1): Pt will consume Dys 1 textures and thin liquids with Supervision level cues for utilization of safe swallowing strategies SLP Short Term Goal 2 (Week 1): Pt will demonstrate adequate masticaiton and oral clearance of Dys 2 textures with supervision level cues SLP Short Term Goal 3 (Week 1): Pt will perform oral motor exercises with Min cues SLP Short Term Goal 4 (Week 1): Pt will utilize speech intelligibility strategies at the phrase level with Min cues  Skilled Therapeutic Interventions: Skilled treatment session focused on addressing dysphagia and dysarthria goals. SLP facilitated session with demonstration of and Min cues to accurately complete oral motor exercises.  SLP also provided verbal education regarding speech intelligibility strategies, patient asked appropriate questions to further his understanding of the concepts.  SLP also facilitated session with set-up and Min verbal cues to recall and utilize safe swallow strategies while consuming trials of Dys.2 textures and thin liquids.  Patient demonstrated no overt s/s of aspiration.     FIM:  Comprehension Comprehension Mode: Auditory Comprehension: 6-Follows complex conversation/direction: With extra time/assistive device Expression Expression Mode: Verbal Expression: 3-Expresses basic 50 - 74% of the time/requires cueing 25 - 50% of the time. Needs to repeat parts of sentences. Social Interaction Social Interaction: 5-Interacts appropriately 90% of the time - Needs monitoring or encouragement for participation or interaction. Problem Solving Problem Solving: 5-Solves basic 90% of the time/requires cueing < 10% of the time Memory Memory: 6-More than reasonable amt of  time FIM - Eating Eating Activity: 4: Helper checks for pocketed food;5: Set-up assist for open containers;5: Needs verbal cues/supervision  Pain Pain Assessment Pain Assessment: No/denies pain  Therapy/Group: Individual Therapy  Carmelia Roller., [place] [phone]  [place] 11/23/2013, 4:14 PM

## 2013-11-23 NOTE — Progress Notes (Signed)
[place] Individual Statement of Services  Patient Name:  Brondon Wann [facility]  Date:  11/23/2013  Welcome to the [place].  Our goal is to provide you with an individualized program based on your diagnosis and situation, designed to meet your specific needs.  With this comprehensive rehabilitation program, you will be expected to participate in at least 3 hours of rehabilitation therapies Monday-Friday, with modified therapy programming on the weekends.  Your rehabilitation program will include the following services:  Physical Therapy (PT), Occupational Therapy (OT), Speech Therapy (ST), 24 hour per day rehabilitation nursing, Therapeutic Recreation (TR), Neuropsychology, Case Management (Social Worker), Rehabilitation Medicine, Nutrition Services and Pharmacy Services  Weekly team conferences will be held on Wednesdays to discuss your progress.  Your Social Worker will talk with you frequently to get your input and to update you on team discussions.  Team conferences with you and your family in attendance may also be held.  Expected length of stay: 7-10 days  Overall anticipated outcome: Supervision  Depending on your progress and recovery, your program may change. Your Social Worker will coordinate services and will keep you informed of any changes. Your Social Worker's name and contact numbers are listed  below.  The following services may also be recommended but are not provided by the [facility] will be made to provide these services after discharge if needed.  Arrangements include referral to agencies that provide these services.  Your insurance has been verified to be:  [employer] Your primary doctor is:  Dr. Gar Ponto  Pertinent information will be shared with your doctor and your insurance  company.  Social Worker:  Alfonse Alpers, LCSW  [phone] or ([phone]  Information discussed with and copy given to patient by: Trey Sailors, 11/23/2013, 1:58 PM

## 2013-11-23 NOTE — IPOC Note (Signed)
Overall Plan of Care [facility]) Patient Details Name: Joshua Horton MRN: [id number] DOB: [birth year]  Admitting Diagnosis: R MCA INFARCT  Hospital Problems: Principal Problem:   CVA (cerebral infarction) Active Problems:   HTN (hypertension)   Hyperlipemia   Hip pain, bilateral--chronic     Functional Problem List: Nursing Motor;Medication Management;Endurance;Sensory  PT Balance;Safety;Motor  OT Balance;Motor;Sensory;Safety  SLP    TR         Basic ADL's: OT Eating;Grooming;Bathing;Dressing;Toileting     Advanced  ADL's: OT       Transfers: PT Bed Mobility;Bed to Chair;Car;Furniture  OT Toilet;Tub/Shower     Locomotion: PT Ambulation;Stairs     Additional Impairments: OT Fuctional Use of Upper Extremity  SLP Swallowing;Communication expression    TR      Anticipated Outcomes Item Anticipated Outcome  Self Feeding set up  Swallowing  Supervision with least restrictive PO   Basic self-care  supervision (except for A to tie shoes, unless pt can use elastic laces)  Toileting  supervision   Bathroom Transfers supervision  Bowel/Bladder  cont of bowel and bladder mod I  Transfers  supervision basic and car  Locomotion  supervision gait x 150' controlled and home; x 200' community; up/down 5 steps 2 rails   Communication  Min  Cognition     Pain  pain less than 3/10  Safety/Judgment  pt will follow safety plan and reduce risk of fall mod I   Therapy Plan: PT Intensity: Minimum of 1-2 x/day ,45 to 90 minutes PT Frequency: 5 out of 7 days PT Duration Estimated Length of Stay: 7-10 OT Intensity: Minimum of 1-2 x/day, 45 to 90 minutes OT Frequency: 5 out of 7 days OT Duration/Estimated Length of Stay: 7-10 days SLP Intensity: Minumum of 1-2 x/day, 30 to 90 minutes SLP Frequency: 5 out of 7 days SLP Duration/Estimated Length of Stay: 7-10 days       Team Interventions: Nursing Interventions Patient/Family Education;Disease  Management/Prevention;Medication Management;Pain Management  PT interventions Ambulation/gait training;Balance/vestibular training;Discharge planning;Neuromuscular re-education;Functional mobility training;DME/adaptive equipment instruction;Patient/family education;Psychosocial support;Splinting/orthotics;UE/LE Coordination activities;UE/LE Strength taining/ROM;Therapeutic Exercise;Therapeutic Activities;Stair training;Wheelchair propulsion/positioning  OT Interventions Publishing copy;Functional mobility training;Neuromuscular re-education;Patient/family education;Self Care/advanced ADL retraining;Therapeutic Activities;Therapeutic Exercise;UE/LE Strength taining/ROM;UE/LE Coordination activities  SLP Interventions Dysphagia/aspiration precaution training;Speech/Language facilitation;Oral motor exercises;Patient/family education;Therapeutic Activities;Therapeutic Exercise  TR Interventions    SW/CM Interventions      Team Discharge Planning: Destination: PT-Home ,OT- Home , SLP-Home Projected Follow-up: PT-Home health PT;Outpatient PT, OT-  Outpatient OT, SLP-Home Health SLP;24 hour supervision/assistance Projected Equipment Needs: PT-Other (comment) (TBD), OT- Tub/shower seat, SLP-None recommended by SLP Equipment Details: PT- , OT-  Patient/family involved in discharge planning: PT- Patient;Family member/caregiver,  OT-Patient;Family member/caregiver, SLP-Patient;Family member/caregiver  MD ELOS: 7-10days Medical Rehab Prognosis:  Good Assessment: 68 y.o. Left handed male with history of CAD, HTN, COPD; who was admitted on 11/16/13 with acute onset of speech difficulty and left sided weakness. Patient reported similar transient symptoms on 11/03/13. CT head without acute changes. Treated with tPA. MRI/MRA brain with scattered acute right MCA infarcts, most confluent at the posterior insula superimposed rigth occipital lobe  hemorrhagic infarct with focal hematoma 30 X 19 X 13 mm, trace SAH over posterior right hemisphere, R MCA M2 segment occlusion/stenosis and suspicion of plaque or thrombus supraclinoid R- ICA causing mild to moderate stenosis. Carotid dopplers with 40-59% R--ICA stenosis and 1-39% L-ICA stenosis. 2D echo with EF 65-70% with grade 1 diastolic dysfunction and no wall abnormality.  MBS done on 01/03 showing multifactorial dysphagia  with delayed transit and moderate residue therefore placed on D1, thin liquids. 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 TEE done revealing EF 65% and negative for ASD, PFO or LAA thrombus. Loop recorder placed by Dr. Caryl Comes on 11/19/13. NO blood thinner for now--neurology recommends repeat CCT in a week   Now requiring 24/7 Rehab RN,MD, as well as CIR level PT, OT and SLP.  Treatment team will focus on ADLs and mobility with goals set at [place]   See Team Conference Notes for weekly updates to the plan of care

## 2013-11-23 NOTE — Progress Notes (Addendum)
Social Work Assessment and Plan  Patient Details  Name: Joshua Horton MRN: [id number] Date of Birth: 1946-06-28  Today's Date: 11/23/2013  Problem List:  Patient Active Problem List   Diagnosis Date Noted  . Hip pain, bilateral--chronic 11/22/2013  . CVA (cerebral infarction) 11/21/2013  . Embolic cerebral infarction   . Unspecified cerebral artery occlusion with cerebral infarction 11/16/2013  . COPD (chronic obstructive pulmonary disease) 08/27/2013  . CAD S/P percutaneous coronary angioplasty 07/26/2013  . HTN (hypertension) 07/26/2013  . Hyperlipemia 07/26/2013  . History of tobacco use in home 07/26/2013  . Atherosclerosis of native arteries of the extremities with intermittent claudication 08/23/2012   Past Medical History:  Past Medical History  Diagnosis Date  . Coronary artery disease   . Wheezing   . Chronic cough   . Peripheral vascular disease   . Hypertension   . Hyperlipidemia   . Reflux   . COPD (chronic obstructive pulmonary disease)   . Myocardial infarction   . CHF (congestive heart failure)   . Embolic cerebral infarction     S/P IV t-PA,  post-tPA hemorrhagic transformation,clinically asymptomatic, scattered right MCA ischemic infarcts, right occipital lobe hemorrhagic infarction with focal hematoma, small volume of right subarachnoid hemorrhage    Past Surgical History:  Past Surgical History  Procedure Laterality Date  . Coronary angioplasty with stent placement  2003  . Cervical disc surgery      Dr Venetia Maxon  . Tee without cardioversion N/A 11/19/2013    Procedure: TRANSESOPHAGEAL ECHOCARDIOGRAM (TEE);  Surgeon: Wendall Stade, MD;  Location: [facility] ENDOSCOPY;  Service: Cardiovascular;  Laterality: N/A;  . Loop recorder implant  11/19/2013    MDT LinQ implanted by Dr Graciela Husbands for cryptogenic stroke   Social History:  reports that he quit smoking about 7 months ago. His smoking use included Cigarettes. He smoked 1.00 pack per day. He has never used smokeless  tobacco. He reports that he does not drink alcohol or use illicit drugs.  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 Guardian/Conservator: N/A   Abuse/Neglect Physical Abuse: Denies Verbal Abuse: Denies Sexual Abuse: Denies Exploitation of patient/patient's resources: Denies Self-Neglect: Denies  Emotional Status Pt's affect, behavior and adjustment status: Pt is motivated and upbeat Recent Psychosocial Issues: none reported Pyschiatric History: none reported Substance Abuse History: none reported  Patient / Family Perceptions, Expectations & Goals Pt/Family understanding of illness & functional limitations: Pt feels he has a good understanding of his condition and his questions have been answered by the team. Premorbid pt/family roles/activities: Pt likes to fish, spend time with family, be outside. Anticipated changes in roles/activities/participation: Pt and wife share household chores. Pt/family expectations/goals: "I want to get my left arm working and get home."  [employer]: None Premorbid Home Care/DME Agencies: None Transportation available at discharge: wife Resource referrals recommended: Neuropsychology;Support group (specify)  Discharge Planning Living Arrangements: Spouse/significant other Support Systems: Spouse/significant  other;Children;Friends/neighbors Type of Residence: Private residence Insurance Resources: [employer] Surveyor, quantity Resources: Tree surgeon;Other (Comment) (pension from [employer]) Financial Screen Referred:  No Living Expenses: Own Money Management: Patient;Spouse Does the patient have any problems obtaining your medications?: No Home Management: Pt/wife share household chores. Patient/Family Preliminary Plans: Wife can provide 24/7 supervision and some assistance, as needed. Barriers to Discharge: Steps Social Work Anticipated Follow Up Needs: HH/OP Expected length of stay: 7-10 days  Clinical Impression CSW met with pt and his stepson to introduce self and CSW role, as well as complete assessment.  Pt was upbeat and he and his stepson get along very well.  Pt's wife will be able to assist pt and will provide 24/7 supervision.  Pt did not have any questions/concerns for CSW to address at this time.  CSW explained that CSW will continue to follow and will assist with any needs as they arise and make sure he has what he needs prior to d/c.  Pt and stepson were appreciative of CSW visit.  Joshua Horton, Joshua Horton 11/23/2013, 2:11 PM

## 2013-11-23 NOTE — Progress Notes (Signed)
Physical Therapy Session Note  Patient Details  Name: Joshua Horton MRN: [id number] Date of Birth: 12/13/1945  Today's Date: 11/23/2013 Time: 9:30-10:00 (30) and 13:00-13:45 (110min)   Skilled Therapeutic Interventions/Progress Updates:  1:2 Tx focused on safety with mobility, dynamic gait and dynamic balance as well as stairs.  Pt sitting up by window with wife present.  Min-guard A for all transfers.  Min guard for gait in controlled setting with LUE support for increased proprioception. Gait marked by decreased L heel strike and somewhat erratic foot placement, asymmetry noted.  Dynamic gait including side stepping, tandem walking with up to mod A for balance with decreased BOS. Dynamic standing balance: step-taps in forward and lateral directions to number circles in random and alternating order for NMR and step taps to 4" step x20 with min A  Passive HS stretch x68min each side. C/o min hip pain, needing no intervention per pt. Stairs x5 with min A without rail. Pt left up in recliner with wife present.  2:2 Tx focused on dynamic gait, NMR for LUE/LE activation, activity tolerance, and education on CVA risk reduction and lifestyle modification.  Close S for transfers, gait x200' in controlled setting with min-guard and LUE support. Gait challenged with box kicking 2x100' and gait on carpet with turns and obstacle course with min-guard/S.  Dynamic balance with obstacle course for cone weaving, step overs, walking on platform, and on compliant surface with close S only 4x20' Nustep x70min with bil LE and LUE with grip for UE activation and timing. Pt challenged to increase pace to 45spm.  Steps x10 with close S and 1 rail with cues for safest strategy.  Pt left up in recliner with wife present, discussed lifestyle adjustments for optimal health at DC.      Therapy Documentation Precautions:  Precautions Precautions: Fall Precaution Comments: risk for subluxation and injury to left  hand due to decr ROM and sensation Restrictions Weight Bearing Restrictions: No General:   Vital Signs: Therapy Vitals Pulse Rate: 101 Oxygen Therapy SpO2: 97 % O2 Device: None (Room air) Pain: Pain Assessment Pain Assessment: No/denies pain    Locomotion : Ambulation Ambulation/Gait Assistance: 4: Min assist   See FIM for current functional status  Therapy/Group: Individual Therapy Kennieth Rad, PT, DPT   Kennieth Rad M 11/23/2013, 9:47 AM

## 2013-11-24 ENCOUNTER — Inpatient Hospital Stay (HOSPITAL_COMMUNITY): Payer: Medicare Other

## 2013-11-24 ENCOUNTER — Inpatient Hospital Stay (HOSPITAL_COMMUNITY): Payer: Medicare Other | Admitting: Speech Pathology

## 2013-11-24 ENCOUNTER — Encounter (HOSPITAL_COMMUNITY): Payer: Medicare Other | Admitting: Occupational Therapy

## 2013-11-24 DIAGNOSIS — I6350 Cerebral infarction due to unspecified occlusion or stenosis of unspecified cerebral artery: Secondary | ICD-10-CM

## 2013-11-24 DIAGNOSIS — Z9861 Coronary angioplasty status: Secondary | ICD-10-CM

## 2013-11-24 DIAGNOSIS — I10 Essential (primary) hypertension: Secondary | ICD-10-CM

## 2013-11-24 DIAGNOSIS — J449 Chronic obstructive pulmonary disease, unspecified: Secondary | ICD-10-CM

## 2013-11-24 DIAGNOSIS — I2510 Atherosclerotic heart disease of native coronary artery without angina pectoris: Secondary | ICD-10-CM

## 2013-11-24 NOTE — Progress Notes (Signed)
Joshua Horton is a 68 y.o. male 1946-02-12 [id number]  Subjective: No new complaints. No new problems. Slept well. Feeling OK.  Objective: Vital signs in last 24 hours: Temp:  [97.4 F (36.3 C)-97.5 F (36.4 C)] 97.5 F (36.4 C) (01/10 0551) Pulse Rate:  [84-101] 84 (01/10 0551) Resp:  [18-20] 20 (01/10 0551) BP: (151-161)/(87-90) 151/87 mmHg (01/10 0551) SpO2:  [92 %-97 %] 92 % (01/10 0551) Weight change:  Last BM Date: 11/23/13  Intake/Output from previous day: 01/09 0701 - 01/10 0700 In: 480 [P.O.:480] Out: -  Last cbgs: CBG (last 3)  No results found for this basename: GLUCAP,  in the last 72 hours   Physical Exam General: No apparent distress    HEENT: moist mucosa Lungs: Normal effort. Lungs clear to auscultation, no crackles or wheezes. Cardiovascular: Regular rate and rhythm, no edema Abdomen: S/NT/ND; BS(+) Musculoskeletal:  No change from before Neurological: No new neurological deficits Wounds: N/A    Skin: clear Alert, cooperative   Lab Results: BMET    Component Value Date/Time   NA 145 11/22/2013 0603   K 3.9 11/22/2013 0603   CL 103 11/22/2013 0603   CO2 25 11/22/2013 0603   GLUCOSE 99 11/22/2013 0603   BUN 16 11/22/2013 0603   CREATININE 0.85 11/22/2013 0603   CALCIUM 9.6 11/22/2013 0603   GFRNONAA 88* 11/22/2013 0603   GFRAA >90 11/22/2013 0603   CBC    Component Value Date/Time   WBC 6.6 11/22/2013 0603   RBC 5.18 11/22/2013 0603   HGB 15.7 11/22/2013 0603   HCT 46.5 11/22/2013 0603   PLT 251 11/22/2013 0603   MCV 89.8 11/22/2013 0603   MCH 30.3 11/22/2013 0603   MCHC 33.8 11/22/2013 0603   RDW 14.0 11/22/2013 0603   LYMPHSABS 2.1 11/22/2013 0603   MONOABS 0.7 11/22/2013 0603   EOSABS 0.2 11/22/2013 0603   BASOSABS 0.0 11/22/2013 0603    Studies/Results: No results found.  Medications: I have reviewed the patient's current medications.  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 DVT  Prophylaxis/Anticoagulation: Mechanical: Sequential compression devices, below knee Bilateral lower extremities  Pharmaceutical: Lovenox  2. DJD/Pain Management: Prn tylenol. Will add Voltaren gel additionally to help with symptoms.  3. Mood: Noted to have mild underlying anxiety. Team to provide ego support. LCSW to follow for support and evaluation.  4. Neuropsych: This patient is capable of making decisions on his own behalf.  5. HTN: Will monitor BP with bid checks. Continue Norvasc. Prinizide on hold to avoid hypotension. Resume as indicated.  6. Dyslipidemia: On lipitor daily  7. Dysphagia: D1, thin diet---needs to chew well and take time with swallowing  Cont Rx    Length of stay, days: 3  Walker Kehr , MD 11/24/2013, 9:21 AM

## 2013-11-24 NOTE — Progress Notes (Signed)
Speech Language Pathology Daily Session Note  Patient Details  Name: Joshua Horton MRN: [id number] Date of Birth: 01/30/1946  Today's Date: 11/24/2013 Time: 1430-1500 Time Calculation (min): 30 min  Short Term Goals: Week 1: SLP Short Term Goal 1 (Week 1): Pt will consume Dys 1 textures and thin liquids with Supervision level cues for utilization of safe swallowing strategies SLP Short Term Goal 2 (Week 1): Pt will demonstrate adequate masticaiton and oral clearance of Dys 2 textures with supervision level cues SLP Short Term Goal 3 (Week 1): Pt will perform oral motor exercises with Min cues SLP Short Term Goal 4 (Week 1): Pt will utilize speech intelligibility strategies at the phrase level with Min cues  Skilled Therapeutic Interventions: Treatment focus on speech goals. SLP facilitated session by providing Min verbal and visual cues for adequate performance of oral-motor exercises. Pt recalled speech intelligibility strategies with Mod I but required Mod multimodal cueing to utilize them at the sentence level throughout a functional conversation.  Pt consumed thin liquids via cup with overt cough X 1 throughout the session, suspect due to large sip. Continue plan of care.    FIM:  Comprehension Comprehension Mode: Auditory Comprehension: 6-Follows complex conversation/direction: With extra time/assistive device Expression Expression Mode: Verbal Expression: 3-Expresses basic 50 - 74% of the time/requires cueing 25 - 50% of the time. Needs to repeat parts of sentences. Social Interaction Social Interaction: 5-Interacts appropriately 90% of the time - Needs monitoring or encouragement for participation or interaction. Problem Solving Problem Solving: 5-Solves complex 90% of the time/cues < 10% of the time Memory Memory: 6-More than reasonable amt of time  Pain Pain Assessment Pain Assessment: No/denies pain  Therapy/Group: Individual Therapy  Joshua Horton, [name] 11/24/2013, 3:32  PM

## 2013-11-24 NOTE — Progress Notes (Signed)
Physical Therapy Session Note  Patient Details  Name: Joshua Horton MRN: [id number] Date of Birth: [birth year]  Today's Date: 11/24/2013 Time: 8676-1950 Time Calculation (min): 45 min  Skilled Therapeutic Interventions/Progress Updates:  Pt seated EOB with wife present upon PT entering room. Pt agreeable to therapy. Pt unable to propel wheel chair due to Bilateral LE unable to reach the floor and decreased grip in the LUE. PT propelled pt to the gym. Pt ambulated in hallways without an AD at close supervision level with cues for safety awareness, upright posture and to decrease gait cadence. Pt also instructed to promote increased heel strike, hip and knee flexion of the LLE. Pt able to ambulate 250 feet prior to returning to the gym. Pt negotiated up/down 20 steps without railings and negotiate a curb step without an AD at close supervision. The curb and stairs set-up as an obstacle course to promote increased proprioception and stability without use of bilateral UE. Pt completed there ex in standing, including heel raises, toe raises, hip abd, mini-squats, hamstring curls and hip ext; 1x15. PT performed manual stretching in BLE heel cords and hamstrings; 2x30 sec. Pt ambulated back to his room and requested to sit EOB. Pt's wife present and nsg notified. All needs within reach.   Therapy Documentation Precautions:  Precautions Precautions: Fall Precaution Comments: risk for subluxation and injury to left hand due to decr ROM and sensation Restrictions Weight Bearing Restrictions: No Pain: Pain Assessment Pain Assessment: No/denies pain  See FIM for current functional status  Therapy/Group: Individual Therapy  Lauralee Waters R 11/24/2013, 3:08 PM

## 2013-11-25 ENCOUNTER — Inpatient Hospital Stay (HOSPITAL_COMMUNITY): Payer: Medicare Other | Admitting: Occupational Therapy

## 2013-11-25 ENCOUNTER — Inpatient Hospital Stay (HOSPITAL_COMMUNITY): Payer: Medicare Other | Admitting: Physical Therapy

## 2013-11-25 ENCOUNTER — Encounter (HOSPITAL_COMMUNITY): Payer: Medicare Other | Admitting: Occupational Therapy

## 2013-11-25 DIAGNOSIS — I6340 Cerebral infarction due to embolism of unspecified cerebral artery: Secondary | ICD-10-CM

## 2013-11-25 NOTE — Progress Notes (Signed)
Joshua Horton is a 68 y.o. male [birth year] [id number]  Subjective: No new complaints. No new problems. Slept well. Feeling OK. He doesn't like his food consistency...  Objective: Vital signs in last 24 hours: Temp:  [97.7 F (36.5 C)-97.9 F (36.6 C)] 97.7 F (36.5 C) (01/11 0517) Pulse Rate:  [83-90] 83 (01/11 0517) Resp:  [20-22] 20 (01/11 0517) BP: (137-144)/(80-84) 144/80 mmHg (01/11 0517) SpO2:  [92 %-94 %] 94 % (01/11 0517) Weight change:  Last BM Date: 11/23/13  Intake/Output from previous day: 01/10 0701 - 01/11 0700 In: 720 [P.O.:720] Out: -  Last cbgs: CBG (last 3)  No results found for this basename: GLUCAP,  in the last 72 hours   Physical Exam General: No apparent distress    HEENT: moist mucosa Lungs: Normal effort. Lungs clear to auscultation, no crackles or wheezes. Cardiovascular: Regular rate and rhythm, no edema Abdomen: S/NT/ND; BS(+) Musculoskeletal:  No change from before Neurological: No new neurological deficits Wounds: N/A    Skin: clear Alert, cooperative   Lab Results: BMET    Component Value Date/Time   NA 145 11/22/2013 0603   K 3.9 11/22/2013 0603   CL 103 11/22/2013 0603   CO2 25 11/22/2013 0603   GLUCOSE 99 11/22/2013 0603   BUN 16 11/22/2013 0603   CREATININE 0.85 11/22/2013 0603   CALCIUM 9.6 11/22/2013 0603   GFRNONAA 88* 11/22/2013 0603   GFRAA >90 11/22/2013 0603   CBC    Component Value Date/Time   WBC 6.6 11/22/2013 0603   RBC 5.18 11/22/2013 0603   HGB 15.7 11/22/2013 0603   HCT 46.5 11/22/2013 0603   PLT 251 11/22/2013 0603   MCV 89.8 11/22/2013 0603   MCH 30.3 11/22/2013 0603   MCHC 33.8 11/22/2013 0603   RDW 14.0 11/22/2013 0603   LYMPHSABS 2.1 11/22/2013 0603   MONOABS 0.7 11/22/2013 0603   EOSABS 0.2 11/22/2013 0603   BASOSABS 0.0 11/22/2013 0603    Studies/Results: No results found.  Medications: I have reviewed the patient's current medications.  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 DVT Prophylaxis/Anticoagulation: Mechanical: Sequential compression devices, below knee Bilateral lower extremities  Pharmaceutical: Lovenox  2. DJD/Pain Management: Prn tylenol. Will add Voltaren gel additionally to help with symptoms.  3. Mood: Noted to have mild underlying anxiety. Team to provide ego support. LCSW to follow for support and evaluation.  4. Neuropsych: This patient is capable of making decisions on his own behalf.  5. HTN: Will monitor BP with bid checks. Continue Norvasc. Prinizide on hold to avoid hypotension. Resume as indicated.  6. Dyslipidemia: On lipitor daily  7. Dysphagia: D1, thin diet---needs to chew well and take time with swallowing  Cont Rx    Length of stay, days: 4  Walker Kehr , MD 11/25/2013, 8:56 AM

## 2013-11-25 NOTE — Progress Notes (Signed)
Occupational Therapy Session Notes  Patient Details  Name: Joshua Horton MRN: [id number] Date of Birth: 11-28-1945  Today's Date: 11/25/2013 Time: 0900-0955 and 300-330 Time Calculation (min): 55 min and 30 min  Short Term Goals: Week 1:  OT Short Term Goal 1 (Week 1): STGs = LTGs due to short LOS  Skilled Therapeutic Interventions/Progress Updates:  1)  Patient sitting EOB with wife at his side upon arrival.  Self care retraining to include shower, dress and groom.  Focused session on LUE management/positioning, weight bearing and forced use, activity tolerance, use of sock aid, not holding his breath while working hard, and dynamic balance.  Patient used bath mitt on left hand and washed all that he could with his left hand before proceeding with his right hand.  Used left hand as a stabilizer and gross assist when brushing his teeth, and using his LUE to assist with sit><stand.    2) Patient sleeping in bed upon arrival. Engaged in donning shoes seated EOB with adaptive techniques and cues not to hold his breath. Ambulated while bearing weight through left hand with arm extended and encouraged to pick up the pace. NMR activity to include heavy weight bearing into LLE and extended LUE with weight shifts to left while seated as well as in a squat position. Practiced bed postioning on his affected left side to include scapula flat on the bed, slight rotation in the trunk and with hand resting up on pillow near head. Hand off to PT.   Therapy Documentation Precautions:  Precautions Precautions: Fall Precaution Comments: risk for subluxation and injury to left hand due to decr ROM and sensation Restrictions Weight Bearing Restrictions: No Pain: Denies pain ADL: See FIM for current functional status  Therapy/Group: Individual Therapy both sessions  [place], [place] 11/25/2013, 11:34 AM

## 2013-11-25 NOTE — Progress Notes (Signed)
Physical Therapy Note  Patient Details  Name: Joshua Horton MRN: [id number] Date of Birth: Jan 31, 1946 Today's Date: 11/25/2013  1000 - 1055 (55 minutes) individual Pain: no reported pain  Focus of treatment: Neuro re-ed left extremities; gait training;  Treatment: Pt in recliner upon arrival; gait to/from room to gym close SBA 120 feet ; Nustep Level 4 x 10 minutes with hand orthosis on left; seated/standing bilateral UE elbow flexion/extension , trunk rotation and ball toss using lettered therapy ball; D1 PNF patterns focused on flexion/extension of fingers on left ( counting to decrease holding breathe); gait while kicking box to challenge balance SBA; returned to room with wife present.    1530-1610 (40 minutes) individual Pain : no complaint of pain Focus of treatment: Therapeutic activity focused on closed chain left knee control / standing balance; gait training (obstacle course) Treatment: pt in gym finishing OT session; Kinetron in standing 3 X 15 with/ without UE assist for support with tactile cues for left knee extension control; gait over obstacle course ( 4 inch step, stepping over obstacles, changing directions) close SBA for safety ; returned to room with bed alarm activated.    Arlone Lenhardt,JIM 11/25/2013, 10:03 AM

## 2013-11-26 ENCOUNTER — Inpatient Hospital Stay (HOSPITAL_COMMUNITY): Payer: Medicare Other | Admitting: Occupational Therapy

## 2013-11-26 ENCOUNTER — Inpatient Hospital Stay (HOSPITAL_COMMUNITY): Payer: Medicare Other

## 2013-11-26 ENCOUNTER — Inpatient Hospital Stay (HOSPITAL_COMMUNITY): Payer: Medicare Other | Admitting: Speech Pathology

## 2013-11-26 ENCOUNTER — Encounter (HOSPITAL_COMMUNITY): Payer: Self-pay | Admitting: *Deleted

## 2013-11-26 MED ORDER — HYDROCORTISONE ACETATE 25 MG RE SUPP
25.0000 mg | Freq: Two times a day (BID) | RECTAL | Status: DC
  Administered 2013-11-26 – 2013-11-27 (×2): 25 mg via RECTAL
  Filled 2013-11-26 (×6): qty 1

## 2013-11-26 NOTE — Progress Notes (Signed)
Physical Therapy Note  Patient Details  Name: Joshua Horton MRN: [id number] Date of Birth: 29-Sep-1946 Today's Date: 11/26/2013 0800-0900, 60 min individual 1335-1405, 30 min individual  tx 1: Pain 2/10 bil posterior hips/pelvic areas, premedicated  focused on neuromuscular re-education LUE and LLE via forced use, demo, tactile cues for:   L hand gross assist in bil hand activities, with improving control of release, grip.  Berg balance assessment- patient demonstrates increased fall risk as noted by score of 36 /56 on Berg Balance Scale.  (<36= high risk for falls, close to 100%; 37-45 significant >80%; 46-51 moderate >50%; 52-55 lower >25%). Discussed score and implications with pt.  Floor transfer to floor mat with contact guard x 1, supervision x 1, safely.  Discussed fall recovery and calling 911 PRN.  Fall prevention I exs: calf raises/toe raises, 10 x 1.  External perturbations in standing, L hip/ankle strategies elicited.  R stepping strategy elicited.  Hamstrings and heel cord bil stretches in sitting, with VCs.  Instructed pt and wife in self stretching.  Gait without AD, x 200' on level tile and carpet, supervision.  71 m timed walking test: with cue, able to increase velocity, to 2.98'/second.  Gait in room with supervision, nearly bumping obstacle on L as he entered, supervision.  tx 2:  No pain reported.  Gait to/from room x 150' without AD, supervision. Up/down 5 steps 1 rail, x 1, with L foot catching on one step recovered independently, step to technique.  Up/down 3  (5") steps x 3, 1 rail, step through technique, safely with supervision.  neuromuscular re-education via forced use, LUE splint on NuStep, bil UEs, bil LEs at level 5, x 7 minutes, rated 12 on effort scale, HR 92 with ex, O2 sats 98%.  Standing on compliant Airex mat during M-L and A-P wt shifts, close supervision.  Hip and ankle strategies noted bil, delayed on L.  Gait in room with supervision,  bumping an obstacle on L as he entered, no LOB, supervision.  Pt stated he knew obstacle was there.  He did not recognize an safety hazard.  Pt left in room folding wash cloths with L hand, wife present.  All needs in reach.  Parish Dubose 11/26/2013, 7:58 AM

## 2013-11-26 NOTE — Progress Notes (Signed)
Occupational Therapy Session Note  Patient Details  Name: Joshua Horton MRN: [id number] Date of Birth: 03/09/1946  Today's Date: 11/26/2013 Time: 1000-1100 Time Calculation (min): 60 min  Short Term Goals: No short term goals set  Skilled Therapeutic Interventions/Progress Updates:      Pt seen this session for LUE neuro re-ed with a focus on finger/thumb active movement. Pt engaged in several different activities including assisted ball holds, cone/ cup stacking, grasping objects, assisted dowel bar holds and isolated a/arom exercises. Pt has improved forearm control and developing wrist flex/ext.  Towards end of session, pt began to close fingers about 50% of the way and began to flex thumb 25% of the way.  Pt ambulated back to room to rest prior to next session. Pt's wife was present.    Therapy Documentation Precautions:  Precautions Precautions: Fall Precaution Comments: risk for subluxation and injury to left hand due to decr ROM and sensation Restrictions Weight Bearing Restrictions: No    Pain: Pain Assessment Pain Assessment: No/denies pain ADL:  See FIM for current functional status  Therapy/Group: Individual Therapy  [place] 11/26/2013, 12:03 PM

## 2013-11-26 NOTE — Progress Notes (Signed)
68 y.o. Left handed male with history of CAD, HTN, COPD; who was admitted on 11/16/13 with acute onset of speech difficulty and left sided weakness. Patient reported similar transient symptoms on 11/03/13. CT head without acute changes. Treated with tPA. MRI/MRA brain with scattered acute right MCA infarcts, most confluent at the posterior insula superimposed rigth occipital lobe hemorrhagic infarct with focal hematoma 30 X 19 X 13 mm, trace SAH over posterior right hemisphere, R MCA M2 segment occlusion/stenosis and suspicion of plaque or thrombus supraclinoid R- ICA causing mild to moderate stenosis. Carotid dopplers with 40-59% R--ICA stenosis and 1-39% L-ICA stenosis. 2D echo with EF 65-70% with grade 1 diastolic dysfunction and no wall abnormality.  MBS done on 01/03 showing multifactorial dysphagia with delayed transit and moderate residue therefore placed on D1, thin liquids. 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 TEE done revealing EF 65% and negative for ASD, PFO or LAA thrombus. Loop recorder placed by Dr. Caryl Comes on 11/19/13. NO blood thinner for now--neurology recommends repeat CCT in a week to decide on resuming low dose AS and cleared to use lovenox for DVT prophylaxis.   Pt was on 81mg  ASA at home with intermittent stomach upset  Discussed repeat CT head today                 Review of Systems - Negative except constipation and choking on food  Subjective/Complaints: Discussed with OT, pt showing little neglect during functional activities Had left hand pain that improved with repositioning  Objective: Vital Signs: Blood pressure 149/74, pulse 94, temperature 97.3 F (36.3 C), temperature source Oral, resp. rate 20, SpO2 93.00%. No results found. No results found for this or any previous visit (from the past 72 hour(s)).   HEENT: poor dentition Cardio: RRR and no murmur Resp: CTA B/L and unlabored GI: BS positive, Distention and  NT Extremity:  No Edema Skin:   Intact Neuro: Alert/Oriented, Flat, Cranial Nerve Abnormalities Left central 7, Abnormal Sensory reduced Left hand to LT, Abnormal Motor 3-/5 Left Delt, bi, tri, grip, 4/5 L HF, KE, ADF, Abnormal FMC Ataxic/ dec FMC and Dysarthric Musc/Skel:  Normal Gen NAD   Assessment/Plan: 1. Functional deficits secondary to R MCA distribution infarct , Left HP and Hemisensopry def, no severe neglect which require 3+ hours per day of interdisciplinary therapy in a comprehensive inpatient rehab setting. Physiatrist is providing close team supervision and 24 hour management of active medical problems listed below. Physiatrist and rehab team continue to assess barriers to discharge/monitor patient progress toward functional and medical goals. FIM: FIM - Bathing Bathing Steps Patient Completed: Chest;Abdomen;Front perineal area;Buttocks;Right upper leg;Left upper leg;Right lower leg (including foot);Left Arm;Right Arm;Left lower leg (including foot) (used bath mit) Bathing:  (refused)  FIM - Upper Body Dressing/Undressing Upper body dressing/undressing steps patient completed: Thread/unthread right sleeve of pullover shirt/dresss;Put head through opening of pull over shirt/dress;Pull shirt over trunk;Pull shirt around back of front closure shirt/dress;Thread/unthread right sleeve of front closure shirt/dress;Thread/unthread left sleeve of pullover shirt/dress Upper body dressing/undressing: 4: Min-Patient completed 75 plus % of tasks FIM - Lower Body Dressing/Undressing Lower body dressing/undressing steps patient completed: Thread/unthread left underwear leg;Pull underwear up/down;Thread/unthread right pants leg;Thread/unthread left pants leg;Pull pants up/down;Don/Doff right sock;Don/Doff right shoe (sock aid and shoe horn) Lower body dressing/undressing: 3: Mod-Patient completed 50-74% of tasks  FIM - Toileting Toileting steps completed by patient: Adjust clothing prior to  toileting;Performs perineal hygiene;Adjust clothing after toileting Toileting Assistive Devices: Grab bar  or rail for support Toileting: 5: Supervision: Safety issues/verbal cues  FIM - Air cabin crew Transfers: 4-From toilet/BSC: Min A (steadying Pt. > 75%);4-To toilet/BSC: Min A (steadying Pt. > 75%)  FIM - Bed/Chair Transfer Bed/Chair Transfer Assistive Devices: Arm rests Bed/Chair Transfer: 5: Bed > Chair or W/C: Supervision (verbal cues/safety issues);5: Chair or W/C > Bed: Supervision (verbal cues/safety issues)  FIM - Locomotion: Wheelchair Distance: 5 Locomotion: Wheelchair: 0: Activity did not occur FIM - Locomotion: Ambulation Locomotion: Ambulation Assistive Devices: Other (comment) (none) Ambulation/Gait Assistance: 5: Supervision (close) Locomotion: Ambulation: 5: Travels 150 ft or more with supervision/safety issues  Comprehension Comprehension Mode: Auditory Comprehension: 6-Follows complex conversation/direction: With extra time/assistive device  Expression Expression Mode: Verbal Expression: 4-Expresses basic 75 - 89% of the time/requires cueing 10 - 24% of the time. Needs helper to occlude trach/needs to repeat words.  Social Interaction Social Interaction: 5-Interacts appropriately 90% of the time - Needs monitoring or encouragement for participation or interaction.  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 DVT Prophylaxis/Anticoagulation: Mechanical: Sequential compression devices, below knee Bilateral lower extremities  Pharmaceutical: Lovenox  2. DJD/Pain Management: Prn tylenol. Will add Voltaren gel additionally to help with symptoms.  3. Mood: Noted to have mild underlying anxiety. Team to provide ego support. LCSW to follow for support and  evaluation.  4. Neuropsych: This patient is capable of making decisions on his own behalf.  5. HTN: Will monitor BP with bid checks. Continue Norvasc. Prinizide on hold to avoid hypotension. Resume as indicated.  6. Dyslipidemia: On lipitor daily  7. Dysphagia: D1, thin diet---needs to chew well and take time with swallowing  LOS (Days) 5 A FACE TO FACE EVALUATION WAS PERFORMED   Jhane Lorio E 11/26/2013, 7:30 AM

## 2013-11-26 NOTE — Progress Notes (Signed)
Speech Language Pathology Daily Session Note  Patient Details  Name: Joshua Horton MRN: [id number] Date of Birth: [birth year]  Today's Date: 11/26/2013 Time: 6256-3893 Time Calculation (min): 40 min  Short Term Goals: Week 1: SLP Short Term Goal 1 (Week 1): Pt will consume Dys 1 textures and thin liquids with Supervision level cues for utilization of safe swallowing strategies SLP Short Term Goal 2 (Week 1): Pt will demonstrate adequate masticaiton and oral clearance of Dys 2 textures with supervision level cues SLP Short Term Goal 3 (Week 1): Pt will perform oral motor exercises with Min cues SLP Short Term Goal 4 (Week 1): Pt will utilize speech intelligibility strategies at the phrase level with Min cues  Skilled Therapeutic Interventions: Skilled treatment session focused on addressing dysphagia goals.  SLP facilitated session with set-up of Dys.2 texture trials and Supervision level verbal cues to utilize safe swallow strategies.  Patient demonstrated intermittent throat clears and cough x1 which appeared to effectively clear suspected penetrates.  Recommend diet advancement to Dys.2 textures with continued thin liquids and full supervision.  SLP also provided education regarding management of buccal pocketing with lingual or manual weep as needed.   FIM:  Comprehension Comprehension Mode: Auditory Comprehension: 6-Follows complex conversation/direction: With extra time/assistive device Expression Expression Mode: Verbal Expression: 4-Expresses basic 75 - 89% of the time/requires cueing 10 - 24% of the time. Needs helper to occlude trach/needs to repeat words. Social Interaction Social Interaction: 6-Interacts appropriately with others with medication or extra time (anti-anxiety, antidepressant). Problem Solving Problem Solving: 6-Solves complex problems: With extra time Memory Memory: 6-More than reasonable amt of time FIM - Eating Eating Activity: 4: Helper checks for pocketed  food;5: Supervision/cues;5: Set-up assist for open containers  Pain Pain Assessment Pain Assessment: No/denies pain  Therapy/Group: Individual Therapy  Carmelia Roller., CCC-SLP [phone]  [place] 11/26/2013, 11:58 AM

## 2013-11-27 ENCOUNTER — Inpatient Hospital Stay (HOSPITAL_COMMUNITY): Payer: Medicare Other | Admitting: Speech Pathology

## 2013-11-27 ENCOUNTER — Inpatient Hospital Stay (HOSPITAL_COMMUNITY): Payer: Medicare Other

## 2013-11-27 ENCOUNTER — Inpatient Hospital Stay (HOSPITAL_COMMUNITY): Payer: Medicare Other | Admitting: Occupational Therapy

## 2013-11-27 DIAGNOSIS — I6340 Cerebral infarction due to embolism of unspecified cerebral artery: Secondary | ICD-10-CM

## 2013-11-27 DIAGNOSIS — G8110 Spastic hemiplegia affecting unspecified side: Secondary | ICD-10-CM

## 2013-11-27 DIAGNOSIS — I10 Essential (primary) hypertension: Secondary | ICD-10-CM

## 2013-11-27 MED ORDER — ASPIRIN EC 81 MG PO TBEC
81.0000 mg | DELAYED_RELEASE_TABLET | Freq: Every day | ORAL | Status: DC
  Administered 2013-11-27 – 2013-11-28 (×2): 81 mg via ORAL
  Filled 2013-11-27 (×3): qty 1

## 2013-11-27 NOTE — Progress Notes (Signed)
Speech Language Pathology Daily Session Note  Patient Details  Name: Joshua Horton MRN: [id number] Date of Birth: Mar 25, 1946  Today's Date: 11/27/2013 Time: 2355-7322 Time Calculation (min): 55 min  Short Term Goals: Week 1: SLP Short Term Goal 1 (Week 1): Pt will consume Dys 1 textures and thin liquids with Supervision level cues for utilization of safe swallowing strategies SLP Short Term Goal 2 (Week 1): Pt will demonstrate adequate masticaiton and oral clearance of Dys 2 textures with supervision level cues SLP Short Term Goal 3 (Week 1): Pt will perform oral motor exercises with Min cues SLP Short Term Goal 4 (Week 1): Pt will utilize speech intelligibility strategies at the phrase level with Min cues  Skilled Therapeutic Interventions: Skilled treatment session focused on addressing dysphagia and dysarthria goals.  Upon SLP entering room patient was consuming breakfast with wife supervising; patient required increased wait time and Supervision level verbal cues to utilize safe swallow strategies.  Patient consumed Dys.2 textures and thin liquids via cup with no overt s/s of aspiration.  SLP also facilitated session with set-up of regular textures and Supervision level verbal cues to utilize safe swallow strategies, again no overt s/s of aspiration were observed.  Barrier task addressing use of speech intelligibility strategies with an unknown context was used; patient required Min cues for overall intelligibility. However, patient and wife report he is close to his baseline function for speech intelligibility.  Continue with current plan of care.      FIM:  Comprehension Comprehension Mode: Auditory Comprehension: 6-Follows complex conversation/direction: With extra time/assistive device Expression Expression Mode: Verbal Expression: 4-Expresses basic 75 - 89% of the time/requires cueing 10 - 24% of the time. Needs helper to occlude trach/needs to repeat words. Social  Interaction Social Interaction: 6-Interacts appropriately with others with medication or extra time (anti-anxiety, antidepressant). Problem Solving Problem Solving: 6-Solves complex problems: With extra time Memory Memory: 6-More than reasonable amt of time FIM - Eating Eating Activity: 4: Helper checks for pocketed food;5: Supervision/cues;5: Set-up assist for open containers  Pain Pain Assessment Pain Assessment: No/denies pain Pain Score: 2   Therapy/Group: Individual Therapy  Carmelia Roller., CCC-SLP [phone]  [place] 11/27/2013, 12:04 PM

## 2013-11-27 NOTE — Progress Notes (Signed)
Occupational Therapy Session Note  Patient Details  Name: Joshua Horton MRN: [id number] Date of Birth: 1946/09/18  Today's Date: 11/27/2013 Time: 1030-1127 Time Calculation (min): 57 min  Short Term Goals: Week 1:  OT Short Term Goal 1 (Week 1): STGs = LTGs due to short LOS  Skilled Therapeutic Interventions/Progress Updates:    Pt seen for 1:1 OT with focus on LUE NM re-ed with a focus on finger/thumb active movement. Pt donned shoes seated at EOB with increased time due to not using long handled shoe horn.  Ambulated to therapy gym with close supervision.  Engaged in WB through [place] with focus on weight shifting across midline to promote weight bearing through Lt hand and wrist.  Followed WB with several different activities including cone/ cup stacking, grasping objects and sustained grasp of various sized objects, and shoulder flexion/extension with sustained grasp on dowel rod. Pt has improved forearm control and developing wrist flex/ext. Towards end of session, pt began to close fingers about 50% of the way and began to flex thumb 25% of the way, continues with decreased extension. Pt ambulated to family room to join wife.  Therapy Documentation Precautions:  Precautions Precautions: Fall Precaution Comments: risk for subluxation and injury to left hand due to decr ROM and sensation Restrictions Weight Bearing Restrictions: No Pain: Pain Assessment Pain Assessment: No/denies pain Pain Score: 2   See FIM for current functional status  Therapy/Group: Individual Therapy  Simonne Come 11/27/2013, 12:14 PM

## 2013-11-27 NOTE — Progress Notes (Signed)
Physical Therapy Note  Patient Details  Name: MAX NUNO MRN: [id number] Date of Birth: 1946-11-13 Today's Date: 11/27/2013  0930-1015, 45 min individual therapy 1408-1438, 30 min individual therapy  Tx 1:  No pain reported  Family ed with wife for basic and car transfers, gait on level terrain iwthout AD, all with supervision..  Gait x 160' iwthout AD, supervision.  Up/down 4 steps x 3 without rails, supervision/min guard assist for strengthening and motor control for alternating feet ascending and descending.  Advanced gait /obstacle course of stepping over and around obstacles; pt initially did not see 1 item at end of course; performed x 3 without encountering any obstacles, ambulating forward, sideways L or R.  neuromuscular re-education via forced use, demo, VCs for eccentric control when sitting without use of UEs.  Tx 2:  No pain reported.  neuromuscular re-education via forced use, demo, tactile cues for : full wt shifting in standing on Kinetron, without UE support,  20 cycles, x 4; LUE grasp/release of rings, kicking box while ambulating, tall kneeling>< squat kneeling 10 x 1, tall kneeling during LUE find motor task with hand over hand assistance.    Pt able to kick box with either foot while counting backwards from 20, or name days of week, without LOB or stumble.  Heel lift (1/4") placed in L shoe on trial.  Pt felt no difference with lift.  Family/pt ed for bed mobility in flat bed, without rails, set at home height of bed, with supervision and cues for best technique, incorporating LUE. Pt demonstrated spontaneous LUE extension during supine > sit from L sidelying. [place] 11/27/2013, 7:57 AM

## 2013-11-27 NOTE — Progress Notes (Signed)
69 y.o. Left handed male with history of CAD, HTN, COPD; who was admitted on 11/16/13 with acute onset of speech difficulty and left sided weakness. Patient reported similar transient symptoms on 11/03/13. CT head without acute changes. Treated with tPA. MRI/MRA brain with scattered acute right MCA infarcts, most confluent at the posterior insula superimposed rigth occipital lobe hemorrhagic infarct with focal hematoma 30 X 19 X 13 mm, trace SAH over posterior right hemisphere, R MCA M2 segment occlusion/stenosis and suspicion of plaque or thrombus supraclinoid R- ICA causing mild to moderate stenosis. Carotid dopplers with 40-59% R--ICA stenosis and 1-39% L-ICA stenosis. 2D echo with EF 65-70% with grade 1 diastolic dysfunction and no wall abnormality.  MBS done on 01/03 showing multifactorial dysphagia with delayed transit and moderate residue therefore placed on D1, thin liquids. 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 TEE done revealing EF 65% and negative for ASD, PFO or LAA thrombus. Loop recorder placed by Dr. Caryl Comes on 11/19/13. NO blood thinner for now--neurology recommends repeat CCT in a week to decide on resuming low dose AS and cleared to use lovenox for DVT prophylaxis.                    Review of Systems - Negative except constipation and choking on food  Subjective/Complaints:  Pt was on 81mg  ASA at home with intermittent stomach upset  Went over results of repeat CT head today Objective: Vital Signs: Blood pressure 123/66, pulse 79, temperature 97.6 F (36.4 C), temperature source Oral, resp. rate 17, SpO2 96.00%. Ct Head Wo Contrast  11/26/2013   CLINICAL DATA:  Follow-up intracranial hemorrhage. History of recent right middle cerebral artery distribution infarcts and right occipital hemorrhage post tPA.  EXAM: CT HEAD WITHOUT CONTRAST  TECHNIQUE: Contiguous axial images were obtained from the base of the skull through the vertex  without intravenous contrast.  COMPARISON:  11/18/2013, 11/17/2013 and 11/16/2013.  FINDINGS: Acute/subacute right middle cerebral artery distribution infarct most prominent right frontal operculum/ corona radiata region once again noted.  Interval further red cell lysis is of hematoma involving the posterior right temporal-occipital lobe. Central aspect of the hematoma now measures up to 1 cm versus prior 2.5 cm. Mild surrounding vasogenic edema.  Previously noted subarachnoid blood is less conspicuous although not completely cleared.  No new intracranial hemorrhage noted.  Remote left basal ganglia/ corona radiata infarct.  Small vessel disease type changes.  Mild atrophy without hydrocephalus.  No intracranial mass lesion noted on this unenhanced exam.  IMPRESSION: Acute/subacute right middle cerebral artery distribution infarct most prominent right frontal operculum/ corona radiata region once again noted.  Interval further red cell lysis is of hematoma involving the posterior right temporal-occipital lobe. Central aspect of the hematoma now measures up to 1 cm versus prior 2.5 cm. Mild surrounding vasogenic edema.  Previously noted subarachnoid blood is less conspicuous although not completely cleared.  No new intracranial hemorrhage noted.   Electronically Signed   By: Chauncey Cruel M.D.   On: 11/26/2013 15:14   No results found for this or any previous visit (from the past 72 hour(s)).   HEENT: poor dentition Cardio: RRR and no murmur Resp: CTA B/L and unlabored GI: BS positive, Distention and NT Extremity:  No Edema Skin:   Intact Neuro: Alert/Oriented, Flat, Cranial Nerve Abnormalities Left central 7, Abnormal Sensory reduced Left hand to LT, Abnormal Motor 3-/5 Left Delt, bi, tri, grip, 4/5 L HF, KE, ADF, Abnormal [facility]  Ataxic/ dec FMC and Dysarthric Musc/Skel:  Normal Gen NAD   Assessment/Plan: 1. Functional deficits secondary to R MCA distribution infarct , Left HP and Hemisensopry def, no  severe neglect which require 3+ hours per day of interdisciplinary therapy in a comprehensive inpatient rehab setting. Physiatrist is providing close team supervision and 24 hour management of active medical problems listed below. Physiatrist and rehab team continue to assess barriers to discharge/monitor patient progress toward functional and medical goals. FIM: FIM - Bathing Bathing Steps Patient Completed: Chest;Abdomen;Front perineal area;Buttocks;Right upper leg;Left upper leg;Right lower leg (including foot);Left Arm;Right Arm;Left lower leg (including foot) (used bath mit) Bathing:  (refused)  FIM - Upper Body Dressing/Undressing Upper body dressing/undressing steps patient completed: Thread/unthread right sleeve of pullover shirt/dresss;Put head through opening of pull over shirt/dress;Pull shirt over trunk;Pull shirt around back of front closure shirt/dress;Thread/unthread right sleeve of front closure shirt/dress;Thread/unthread left sleeve of pullover shirt/dress Upper body dressing/undressing: 4: Min-Patient completed 75 plus % of tasks FIM - Lower Body Dressing/Undressing Lower body dressing/undressing steps patient completed: Thread/unthread left underwear leg;Pull underwear up/down;Thread/unthread right pants leg;Thread/unthread left pants leg;Pull pants up/down;Don/Doff right sock;Don/Doff right shoe (sock aid and shoe horn) Lower body dressing/undressing: 3: Mod-Patient completed 50-74% of tasks  FIM - Toileting Toileting steps completed by patient: Adjust clothing prior to toileting;Performs perineal hygiene;Adjust clothing after toileting Toileting Assistive Devices: Grab bar or rail for support Toileting: 5: Supervision: Safety issues/verbal cues  FIM - Air cabin crew Transfers: 4-From toilet/BSC: Min A (steadying Pt. > 75%);4-To toilet/BSC: Min A (steadying Pt. > 75%)  FIM - Bed/Chair Transfer Bed/Chair Transfer Assistive Devices: Arm rests Bed/Chair Transfer:  5: Bed > Chair or W/C: Supervision (verbal cues/safety issues);5: Chair or W/C > Bed: Supervision (verbal cues/safety issues)  FIM - Locomotion: Wheelchair Distance: 5 Locomotion: Wheelchair: 0: Activity did not occur FIM - Locomotion: Ambulation Locomotion: Ambulation Assistive Devices: Other (comment) (none) Ambulation/Gait Assistance: 5: Supervision Locomotion: Ambulation: 5: Travels 150 ft or more with supervision/safety issues  Comprehension Comprehension Mode: Auditory Comprehension: 6-Follows complex conversation/direction: With extra time/assistive device  Expression Expression Mode: Verbal Expression: 4-Expresses basic 75 - 89% of the time/requires cueing 10 - 24% of the time. Needs helper to occlude trach/needs to repeat words.  Social Interaction Social Interaction: 6-Interacts appropriately with others with medication or extra time (anti-anxiety, antidepressant).  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 DVT Prophylaxis/Anticoagulation: Mechanical: Sequential compression devices, below knee Bilateral lower extremities  Pharmaceutical: Lovenox  2. DJD/Pain Management: Prn tylenol. Will add Voltaren gel additionally to help with symptoms.  3. Mood: Noted to have mild underlying anxiety. Team to provide ego support. LCSW to follow for support and evaluation.  4. Neuropsych: This patient is capable of making decisions on his own behalf.  5. HTN: Will monitor BP with bid checks. Continue Norvasc. Prinizide on hold to avoid hypotension. Resume as indicated.  6. Dyslipidemia: On lipitor daily  7. Dysphagia: D1, thin diet---needs to chew well and take time with swallowing  LOS (Days) 6 A FACE TO FACE EVALUATION WAS PERFORMED   Joshua Horton  E 11/27/2013, 7:30 AM

## 2013-11-27 NOTE — Progress Notes (Signed)
Nutrition Brief Note  RD consulted for "Food Choices."  Pt was recently on a Dysphagia 1 diet and eating poorly, per wife, was sending foods back 2/2 consistency. Now that pt has been upgraded by SLP to Dysphagia 2 diet, pt is now eating at least 75% of meals. Pt is pleased with his appetite and denies any significant changes PTA. States that his usual weight is 225 lb, which is consistent with his current weight. Declined oral nutrition supplements.  Wife and pt note that over the past "several months" they have made some significant improvements to their diet. They are now eating more fruits and vegetables, whole grains, and using their [employer] to grill meats.  Pt does reveal that he drinks 7-8 regular [employer] cans daily in addition to 1/2 a gallon of sweet tea. Pt with elevated HgbA1c below:  Lab Results  Component Value Date   HGBA1C 6.5* 11/16/2013   Lipid Panel     Component Value Date/Time   CHOL 169 11/16/2013 0402   TRIG 215* 11/16/2013 0402   HDL 32* 11/16/2013 0402   CHOLHDL 5.3 11/16/2013 0402   VLDL 43* 11/16/2013 0402   LDLCALC 94 11/16/2013 0402    RD provided "Heart Healthy Nutrition Therapy" handout from the Academy of [employer]. Reviewed patient's dietary recall. Provided examples on ways to decrease sodium and fat intake in diet. Discouraged intake of processed foods and use of salt shaker. Encouraged fresh fruits and vegetables as well as whole grain sources of carbohydrates to maximize fiber intake. Teach back method used.  Expect good compliance.  Current diet order is Dysphagia 2, patient is consuming approximately 75% of meals at this time. Labs and medications reviewed. No further nutrition interventions warranted at this time. RD contact information provided. If additional nutrition issues arise, please re-consult RD.  Inda Coke MS, RD, LDN Pager: [phone] After-hours pager: [phone]

## 2013-11-28 ENCOUNTER — Inpatient Hospital Stay (HOSPITAL_COMMUNITY): Payer: Medicare Other | Admitting: Occupational Therapy

## 2013-11-28 ENCOUNTER — Inpatient Hospital Stay (HOSPITAL_COMMUNITY): Payer: Medicare Other

## 2013-11-28 ENCOUNTER — Encounter (HOSPITAL_COMMUNITY): Payer: Medicare Other

## 2013-11-28 LAB — CREATININE, SERUM
Creatinine, Ser: 0.93 mg/dL (ref 0.50–1.35)
GFR calc Af Amer: >90 mL/min
GFR calc non Af Amer: 84 mL/min — ABNORMAL LOW

## 2013-11-28 LAB — BASIC METABOLIC PANEL WITH GFR
BUN: 15 mg/dL (ref 6–23)
CO2: 24 meq/L (ref 19–32)
Calcium: 9.9 mg/dL (ref 8.4–10.5)
Chloride: 103 meq/L (ref 96–112)
Creatinine, Ser: 0.88 mg/dL (ref 0.50–1.35)
GFR calc Af Amer: >90 mL/min
GFR calc non Af Amer: 86 mL/min — ABNORMAL LOW
Glucose, Bld: 116 mg/dL — ABNORMAL HIGH (ref 70–99)
Potassium: 4.3 meq/L (ref 3.7–5.3)
Sodium: 142 meq/L (ref 137–147)

## 2013-11-28 MED ORDER — DICLOFENAC SODIUM 1 % TD GEL: 2.0000 g | Freq: Three times a day (TID) | TRANSDERMAL | Status: DC

## 2013-11-28 MED ORDER — HYDROCORTISONE ACETATE 25 MG RE SUPP
25.0000 mg | Freq: Two times a day (BID) | RECTAL | Status: DC
Start: 2013-11-28 — End: 2014-02-05

## 2013-11-28 MED ORDER — AMLODIPINE BESYLATE 5 MG PO TABS: 5.0000 mg | ORAL_TABLET | Freq: Every day | ORAL | Status: DC

## 2013-11-28 MED ORDER — ASPIRIN 81 MG PO TBEC: 81.0000 mg | DELAYED_RELEASE_TABLET | Freq: Every day | ORAL | Status: AC

## 2013-11-28 MED ORDER — HYDROCERIN EX CREA: 1.0000 "application " | TOPICAL_CREAM | Freq: Two times a day (BID) | CUTANEOUS | Status: DC

## 2013-11-28 NOTE — Progress Notes (Signed)
Physical Therapy Discharge Summary  Patient Details  Name: Joshua Horton MRN: [id number] Date of Birth: [birth year]  Today's Date: 11/28/2013  Patient has met 11 of 11 long term goals due to improved activity tolerance, improved balance, improved postural control, increased strength, increased range of motion, ability to compensate for deficits, functional use of  left upper extremity and left lower extremity, improved awareness and improved coordination.  Patient to discharge at an ambulatory level Supervision.   Patient's care partner is independent to provide the necessary cognitive assistance at discharge.  Reasons goals not met: n/a  Recommendation:  Patient will benefit from ongoing skilled PT services in outpatient setting to continue to advance safe functional mobility, address ongoing impairments in high level balance, coordination, muscular endurance, awareness, and minimize fall risk.  Equipment: No equipment provided  Reasons for discharge: treatment goals met and discharge from hospital  Patient/family agrees with progress made and goals achieved: Yes  PT Discharge Precautions/Restrictions Precautions Precautions: Fall Precaution Comments: risk for subluxation and injury to left hand due to decr ROM and sensation Restrictions Weight Bearing Restrictions: No   Pain Pain Assessment Pain Assessment: No/denies pain Vision/Perception  Vision - History Baseline Vision: Wears glasses only for reading Patient Visual Report: No change from baseline Vision - Assessment Ocular Range of Motion: Within Functional Limits Tracking/Visual Pursuits: Able to track stimulus in all quads without difficulty Perception Perception: Within Functional Limits Praxis Praxis: Intact  Cognition Overall Cognitive Status: Within Functional Limits for tasks assessed Arousal/Alertness: Awake/alert Orientation Level: Oriented X4 Attention: Divided Safety/Judgment: Appears  intact Sensation Sensation Light Touch: Appears Intact ( bil LEs) Stereognosis: Impaired by gross assessment Hot/Cold: Appears Intact Proprioception: Appears Intact (bil LEs) Coordination Fine Motor Movements are Fluid and Coordinated: No Coordination and Movement Description: Pt is developing a gross grasp in L hand. Finger Nose Finger Test: Pt can now touch hand to nose but does not have isolated finger movement.  Heel Shin Test: reduced excursion, speed and accuracy LLE, but improved over admission Motor  Motor Motor: Hemiplegia Motor - Discharge Observations: improved motor control LLE  Mobility Bed Mobility Bed Mobility:  (modified independent for all) Transfers Transfers: Yes Stand Pivot Transfers: 5: Supervision Locomotion  Ambulation Ambulation: Yes Ambulation/Gait Assistance: 5: Supervision Ambulation Distance (Feet): 300 Feet Assistive device: None Gait Gait: Yes Gait Pattern: Impaired Gait Pattern: Lateral hip instability;Decreased dorsiflexion - left (slight overshift to L?/ pt reported leg length discrepancy, L shorter) Gait velocity: 3.28'/sec; decreased functional efficiency High Level Ambulation High Level Ambulation: Side stepping;Backwards walking;Direction changes;Sudden stops;Head turns (supervision for all).  Gait in congested halls, community simulation, 150', supervision. Stairs / Additional Locomotion Stairs: Yes Stairs Assistance: 5: Supervision Stair Management Technique: One rail Right;Alternating pattern Number of Stairs: 20 Height of Stairs: 7 Also up/down 3 (7") steps without railing Neighbor has installed railings at 2 STE at home. Wheelchair Mobility Wheelchair Mobility: No  Trunk/Postural Assessment  Cervical Assessment Cervical Assessment: Within Functional Limits Thoracic Assessment Thoracic Assessment: Within Functional Limits Lumbar Assessment Lumbar Assessment: Within Functional Limits Postural Control Postural Control: Within  Functional Limits (L ankle  and hip strategies noted with external perturbations) Protective Responses: slight protective extension elicited LUE with posterior external perturbation  Balance Balance Balance Assessed: Yes Standardized Balance Assessment Standardized Balance Assessment: Berg Balance Test;Dynamic Gait Index Berg Balance Test Sit to Stand: Able to stand without using hands and stabilize independently Standing Unsupported: Able to stand safely 2 minutes Sitting with Back Unsupported but Feet Supported on Floor or Stool:  Able to sit safely and securely 2 minutes Stand to Sit: Sits safely with minimal use of hands Transfers: Able to transfer safely, minor use of hands Standing Unsupported with Eyes Closed: Able to stand 10 seconds with supervision Standing Ubsupported with Feet Together: Able to place feet together independently and stand 1 minute safely From Standing, Reach Forward with Outstretched Arm: Can reach forward >12 cm safely (5") From Standing Position, Pick up Object from Floor: Able to pick up shoe safely and easily From Standing Position, Turn to Look Behind Over each Shoulder: Looks behind from both sides and weight shifts well Turn 360 Degrees: Able to turn 360 degrees safely one side only in 4 seconds or less Standing Unsupported, Alternately Place Feet on Step/Stool: Able to stand independently and safely and complete 8 steps in 20 seconds Standing Unsupported, One Foot in Front: Loses balance while stepping or standing Standing on One Leg: Tries to lift leg/unable to hold 3 seconds but remains standing independently Total Score: 46 Dynamic Gait Index Level Surface: Mild Impairment Change in Gait Speed: Mild Impairment Gait with Horizontal Head Turns: Mild Impairment Gait with Vertical Head Turns: Mild Impairment Gait and Pivot Turn: Normal Step Over Obstacle: Normal Step Around Obstacles: Normal Steps: Mild Impairment Total Score: 19 Static Sitting  Balance Static Sitting - Level of Assistance: 7: Independent Dynamic Sitting Balance Dynamic Sitting - Level of Assistance: 7: Independent Static Standing Balance Static Standing - Level of Assistance: 6: Modified independent (Device/Increase time) Dynamic Standing Balance Dynamic Standing - Level of Assistance: 5: Stand by assistance Extremity Assessment  RUE Assessment RUE Assessment: Within Functional Limits LUE AROM (degrees) Left Shoulder Flexion: 170 Degrees Left Elbow Flexion: 150 Left Wrist Extension: 35 Degrees Left Composite Finger Flexion: 50% Left Thumb Opposition: Digit 2 LUE Strength Left Elbow Extension: 4/5 RLE Assessment RLE Assessment: Within Functional Limits LLE Assessment LLE Assessment: Within Functional Limits LLE Strength LLE Overall Strength Comments: grossly 4+/5- 5/5  throughout  See FIM for current functional status  Oluwadara Gorman 11/28/2013, 2:38 PM

## 2013-11-28 NOTE — Progress Notes (Signed)
Physical Therapy Note  Patient Details  Name: Joshua Horton MRN: [id number] Date of Birth: 20-Jul-1946 Today's Date: 11/28/2013 1000-1030, 30 min individual therapy 1330-1400, 30 min individual therapy  tx 1:  No pain reported  Repeated Berg.  Patient demonstrates increased fall risk as noted by score of  46 /56 on Berg Balance Scale.  (<36= high risk for falls, close to 100%; 37-45 significant >80%; 46-51 moderate >50%; 52-55 lower >25%).  Discussed findings with pt.  Issued 2 Fall Prevention II exs hand outs: tandem standing, and SLS.  neuromuscular re-education via demo, VCs  performed with LLE to increase strength for functional mobility.  Gait to/from therapy, cues to increase velocity, successfully without catching L foot, no AD.  tx 2  No pain reported.  DGI= 19/24.  Score discussed with pt and wife.  Wife and pt trained in HEP; hand outs provided.  HEP of self stretching bil HS and HC, in sitting, using stool.  Standing Fall Prevention II exs- tandem standing and SLS.  Gait up/down 2 flights of 10 steps; R rail, supervision, step through pattern ascending and descending.  Pt d/c'd after therapies today.  Monteen Toops 11/28/2013, 10:35 AM

## 2013-11-28 NOTE — Progress Notes (Signed)
Occupational Therapy Session Note  Patient Details  Name: [name] MRN: [id number] Date of Birth: 1946-01-25  Today's Date: 11/28/2013 Time: 0805-0900 Time Calculation (min): 55 min  Short Term Goals: No short term goals set  Skilled Therapeutic Interventions/Progress Updates:      Pt seen for BADL retraining of toileting, bathing, and dressing with a focus on use of AE and LUE as a gross assist. Pt did well ambulating into shower and completing all self care with min verbal cues with AE and supervision. Pt used bath mit, sock aid, shoe horn, and elastic laces. Pt's wife purchased hip kit for him. Pt's wife has been present and participating in all sessions and is I with providing supervision to her spouse.  Reviewed L hand exercises to do at home. Pt is ready for discharge as goals have been met.  Therapy Documentation Precautions:  Precautions Precautions: Fall Precaution Comments: risk for subluxation and injury to left hand due to decr ROM and sensation Restrictions Weight Bearing Restrictions: No   Pain: Pain Assessment Pain Assessment: No/denies pain Pain Score: 0-No pain Faces Pain Scale: No hurt ADL:  See FIM for current functional status  Therapy/Group: Individual Therapy  Devondre Guzzetta 11/28/2013, 11:17 AM

## 2013-11-28 NOTE — Progress Notes (Signed)
Occupational Therapy Discharge Summary  Patient Details  Name: Joshua Horton MRN: [id number] Date of Birth: 1946/02/21  Today's Date: 11/28/2013  Patient has met 9 of 9 long term goals due to improved balance, ability to compensate for deficits, functional use of  LEFT upper extremity and improved coordination.  Patient to discharge at overall Supervision level.  Patient's care partner is independent to provide the necessary physical assistance at discharge.    Reasons goals not met: n/a  Recommendation:  Patient will benefit from ongoing skilled OT services in outpatient setting to continue to advance functional skills in the area of BADL and iADL with a focus on left hand functional use.  Equipment: shower chair, wife purchased a hip kit  Reasons for discharge: treatment goals met  Patient/family agrees with progress made and goals achieved: Yes  OT Discharge ADL ADL Eating: Supervision/safety Grooming: Modified independent Where Assessed-Grooming: Standing at sink Upper Body Bathing: Supervision/safety Where Assessed-Upper Body Bathing: Shower Lower Body Bathing: Supervision/safety Where Assessed-Lower Body Bathing: Shower Upper Body Dressing: Supervision/safety Where Assessed-Upper Body Dressing: Chair Lower Body Dressing: Supervision/safety Where Assessed-Lower Body Dressing: Chair Toileting: Supervision/safety Where Assessed-Toileting: Glass blower/designer: Close supervision Toilet Transfer Method: Magazine features editor: Close supervision Social research officer, government Method: Heritage manager: Civil engineer, contracting with back Vision/Perception  Vision - History Baseline Vision: Wears glasses only for reading Patient Visual Report: No change from baseline Vision - Assessment Ocular Range of Motion: Within Functional Limits Tracking/Visual Pursuits: Able to track stimulus in all quads without difficulty Perception Perception: Within Functional  Limits Praxis Praxis: Intact  Cognition Overall Cognitive Status: Within Functional Limits for tasks assessed Arousal/Alertness: Awake/alert Orientation Level: Oriented X4 Sensation Sensation Light Touch: Appears Intact ( bil LEs) Stereognosis: Impaired by gross assessment Hot/Cold: Appears Intact Proprioception: Appears Intact (bil LEs) Coordination Fine Motor Movements are Fluid and Coordinated: No Coordination and Movement Description: Pt is developing a gross grasp in L hand. Finger Nose Finger Test: Pt can now touch hand to nose but does not have isolated finger movement.  Heel Shin Test: reduced excursion, speed and accuracy LLE, but improved over admission Motor  Motor Motor: Hemiplegia Motor - Discharge Observations: improved motor control LLE Mobility    supervision with transfers Trunk/Postural Assessment  Cervical Assessment Cervical Assessment: Within Functional Limits Thoracic Assessment Thoracic Assessment: Within Functional Limits Lumbar Assessment Lumbar Assessment: Within Functional Limits Postural Control Postural Control: Within Functional Limits Protective Responses: slight protective extension elicited LUE with posterior external perturbation  Balance Balance Balance Assessed: Yes Standardized Balance Assessment Standardized Balance Assessment: Berg Balance Test Berg Balance Test Sit to Stand: Able to stand without using hands and stabilize independently Standing Unsupported: Able to stand safely 2 minutes Sitting with Back Unsupported but Feet Supported on Floor or Stool: Able to sit safely and securely 2 minutes Stand to Sit: Sits safely with minimal use of hands Transfers: Able to transfer safely, minor use of hands Standing Unsupported with Eyes Closed: Able to stand 10 seconds with supervision Standing Ubsupported with Feet Together: Able to place feet together independently and stand 1 minute safely From Standing, Reach Forward with  Outstretched Arm: Can reach forward >12 cm safely (5") From Standing Position, Pick up Object from Floor: Able to pick up shoe safely and easily From Standing Position, Turn to Look Behind Over each Shoulder: Looks behind from both sides and weight shifts well Turn 360 Degrees: Able to turn 360 degrees safely one side only in 4 seconds or less Standing Unsupported,  Alternately Place Feet on Step/Stool: Able to stand independently and safely and complete 8 steps in 20 seconds Standing Unsupported, One Foot in Front: Loses balance while stepping or standing Standing on One Leg: Tries to lift leg/unable to hold 3 seconds but remains standing independently Total Score: 46 Static Sitting Balance Static Sitting - Level of Assistance: 7: Independent Dynamic Sitting Balance Dynamic Sitting - Level of Assistance: 7: Independent Static Standing Balance Static Standing - Level of Assistance: 6: Modified independent (Device/Increase time) Dynamic Standing Balance Dynamic Standing - Level of Assistance: 5: Stand by assistance Extremity/Trunk Assessment RUE Assessment RUE Assessment: Within Functional Limits LUE AROM (degrees) Left Shoulder Flexion: 170 Degrees Left Elbow Flexion: 150 Left Wrist Extension: 35 Degrees Left Composite Finger Flexion: 50% Left Thumb Opposition: Digit 2 LUE Strength Left Elbow Extension: 4/5  See FIM for current functional status  SAGUIER,JULIA 11/28/2013, 11:24 AM

## 2013-11-28 NOTE — Progress Notes (Signed)
68 y.o. Left handed male with history of CAD, HTN, COPD; who was admitted on 11/16/13 with acute onset of speech difficulty and left sided weakness. Patient reported similar transient symptoms on 11/03/13. CT head without acute changes. Treated with tPA. MRI/MRA brain with scattered acute right MCA infarcts, most confluent at the posterior insula superimposed rigth occipital lobe hemorrhagic infarct with focal hematoma 30 X 19 X 13 mm, trace SAH over posterior right hemisphere, R MCA M2 segment occlusion/stenosis and suspicion of plaque or thrombus supraclinoid R- ICA causing mild to moderate stenosis. Carotid dopplers with 40-59% R--ICA stenosis and 1-39% L-ICA stenosis. 2D echo with EF 65-70% with grade 1 diastolic dysfunction and no wall abnormality.  MBS done on 01/03 showing multifactorial dysphagia with delayed transit and moderate residue therefore placed on D1, thin liquids. 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 TEE done revealing EF 65% and negative for ASD, PFO or LAA thrombus. Loop recorder placed by Dr. Caryl Comes on 11/19/13. NO blood thinner for now--neurology recommends repeat CCT in a week to decide on resuming low dose AS and cleared to use lovenox for DVT prophylaxis.                    Review of Systems - Negative except constipation and choking on food  Subjective/Complaints: slept ok No pain C/Os  Review of Systems - Negative except weakness left hand Objective: Vital Signs: Blood pressure 105/62, pulse 67, temperature 98 F (36.7 C), temperature source Oral, resp. rate 18, weight 91.808 kg (202 lb 6.4 oz), SpO2 92.00%. Ct Head Wo Contrast  11/26/2013   CLINICAL DATA:  Follow-up intracranial hemorrhage. History of recent right middle cerebral artery distribution infarcts and right occipital hemorrhage post tPA.  EXAM: CT HEAD WITHOUT CONTRAST  TECHNIQUE: Contiguous axial images were obtained from the base of the skull through the  vertex without intravenous contrast.  COMPARISON:  11/18/2013, 11/17/2013 and 11/16/2013.  FINDINGS: Acute/subacute right middle cerebral artery distribution infarct most prominent right frontal operculum/ corona radiata region once again noted.  Interval further red cell lysis is of hematoma involving the posterior right temporal-occipital lobe. Central aspect of the hematoma now measures up to 1 cm versus prior 2.5 cm. Mild surrounding vasogenic edema.  Previously noted subarachnoid blood is less conspicuous although not completely cleared.  No new intracranial hemorrhage noted.  Remote left basal ganglia/ corona radiata infarct.  Small vessel disease type changes.  Mild atrophy without hydrocephalus.  No intracranial mass lesion noted on this unenhanced exam.  IMPRESSION: Acute/subacute right middle cerebral artery distribution infarct most prominent right frontal operculum/ corona radiata region once again noted.  Interval further red cell lysis is of hematoma involving the posterior right temporal-occipital lobe. Central aspect of the hematoma now measures up to 1 cm versus prior 2.5 cm. Mild surrounding vasogenic edema.  Previously noted subarachnoid blood is less conspicuous although not completely cleared.  No new intracranial hemorrhage noted.   Electronically Signed   By: Chauncey Cruel M.D.   On: 11/26/2013 15:14   Results for orders placed during the hospital encounter of 11/21/13 (from the past 72 hour(s))  CREATININE, SERUM     Status: Abnormal   Collection Time    11/28/13  5:57 AM      Result Value Range   Creatinine, Ser 0.93  0.50 - 1.35 mg/dL   GFR calc non Af Amer 84 (*) >90 mL/min   GFR calc Af Amer >90  >90  mL/min   Comment: (NOTE)     The eGFR has been calculated using the CKD EPI equation.     This calculation has not been validated in all clinical situations.     eGFR's persistently <90 mL/min signify possible Chronic Kidney     Disease.     HEENT: poor dentition Cardio: RRR  and no murmur Resp: CTA B/L and unlabored GI: BS positive, Distention and NT Extremity:  No Edema Skin:   Intact Neuro: Alert/Oriented, Flat, Cranial Nerve Abnormalities Left central 7, Abnormal Sensory reduced Left hand to LT, Abnormal Motor 3-/5 Left Delt, bi, tri, grip, 4/5 L HF, KE, ADF, Abnormal FMC Ataxic/ dec FMC and Dysarthric Musc/Skel:  Normal Gen NAD   Assessment/Plan: 1. Functional deficits secondary to R MCA distribution infarct , Left HP and Hemisensopry def, no severe neglect which require 3+ hours per day of interdisciplinary therapy in a comprehensive inpatient rehab setting. Physiatrist is providing close team supervision and 24 hour management of active medical problems listed below. Physiatrist and rehab team continue to assess barriers to discharge/monitor patient progress toward functional and medical goals. Team conference today please see physician documentation under team conference tab, met with team face-to-face to discuss problems,progress, and goals. Formulized individual treatment plan based on medical history, underlying problem and comorbidities.  FIM: FIM - Bathing Bathing Steps Patient Completed: Chest;Abdomen;Front perineal area;Buttocks;Right upper leg;Left upper leg;Right lower leg (including foot);Left Arm;Right Arm;Left lower leg (including foot) (used bath mit) Bathing:  (refused)  FIM - Upper Body Dressing/Undressing Upper body dressing/undressing steps patient completed: Thread/unthread right sleeve of pullover shirt/dresss;Put head through opening of pull over shirt/dress;Pull shirt over trunk;Pull shirt around back of front closure shirt/dress;Thread/unthread right sleeve of front closure shirt/dress;Thread/unthread left sleeve of pullover shirt/dress Upper body dressing/undressing: 4: Min-Patient completed 75 plus % of tasks FIM - Lower Body Dressing/Undressing Lower body dressing/undressing steps patient completed: Thread/unthread left underwear  leg;Pull underwear up/down;Thread/unthread right pants leg;Thread/unthread left pants leg;Pull pants up/down;Don/Doff right sock;Don/Doff right shoe (sock aid and shoe horn) Lower body dressing/undressing: 3: Mod-Patient completed 50-74% of tasks  FIM - Toileting Toileting steps completed by patient: Adjust clothing prior to toileting;Performs perineal hygiene;Adjust clothing after toileting Toileting Assistive Devices: Grab bar or rail for support Toileting: 5: Supervision: Safety issues/verbal cues  FIM - Air cabin crew Transfers: 4-From toilet/BSC: Min A (steadying Pt. > 75%);4-To toilet/BSC: Min A (steadying Pt. > 75%)  FIM - Bed/Chair Transfer Bed/Chair Transfer Assistive Devices: Arm rests Bed/Chair Transfer: 5: Bed > Chair or W/C: Supervision (verbal cues/safety issues);5: Chair or W/C > Bed: Supervision (verbal cues/safety issues)  FIM - Locomotion: Wheelchair Distance: 5 Locomotion: Wheelchair: 0: Activity did not occur FIM - Locomotion: Ambulation Locomotion: Ambulation Assistive Devices: Other (comment) (none) Ambulation/Gait Assistance: 5: Supervision Locomotion: Ambulation: 5: Travels 150 ft or more with supervision/safety issues  Comprehension Comprehension Mode: Auditory Comprehension: 6-Follows complex conversation/direction: With extra time/assistive device  Expression Expression Mode: Verbal Expression: 4-Expresses basic 75 - 89% of the time/requires cueing 10 - 24% of the time. Needs helper to occlude trach/needs to repeat words.  Social Interaction Social Interaction: 6-Interacts appropriately with others with medication or extra time (anti-anxiety, antidepressant).  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 DVT  Prophylaxis/Anticoagulation: Mechanical: Sequential compression devices, below knee Bilateral lower extremities  Pharmaceutical:  Lovenox  2. DJD/Pain Management: Prn tylenol. Will add Voltaren gel additionally to help with symptoms.  3. Mood: Noted to have mild underlying anxiety. Team to provide ego support. LCSW to follow for support and evaluation.  4. Neuropsych: This patient is capable of making decisions on his own behalf.  5. HTN: Will monitor BP with bid checks. Continue Norvasc. Prinizide on hold to avoid hypotension. Resume as indicated.  6. Dyslipidemia: On lipitor daily  7. Dysphagia: D1, thin diet---needs to chew well and take time with swallowing  LOS (Days) 7 A FACE TO FACE EVALUATION WAS PERFORMED   KIRSTEINS,ANDREW E 11/28/2013, 7:14 AM

## 2013-11-28 NOTE — Progress Notes (Signed)
Speech Language Pathology Daily Session Note  Patient Details  Name: Joshua Horton MRN: [id number] Date of Birth: [birth year]  Today's Date: 11/28/2013 Time: 1130-1215 Time Calculation (min): 45 min  Short Term Goals: Week 1: SLP Short Term Goal 1 (Week 1): Pt will consume Dys 1 textures and thin liquids with Supervision level cues for utilization of safe swallowing strategies SLP Short Term Goal 2 (Week 1): Pt will demonstrate adequate masticaiton and oral clearance of Dys 2 textures with supervision level cues SLP Short Term Goal 3 (Week 1): Pt will perform oral motor exercises with Min cues SLP Short Term Goal 4 (Week 1): Pt will utilize speech intelligibility strategies at the phrase level with Min cues  Skilled Therapeutic Interventions: Skilled treatment focused on speech and dysphagia goals in group setting, as well as focus on education prior to discharge in individual setting. SLP facilitated session with skilled observation of lunch meal consisting of Dys 2 textures and thin liquids. Pt utilized safe swallowing strategies with supervision level cueing, with gentle cough observed x2 throughout meal. SLP provided education to patient/wife regarding current swallowing/speech function and recommendations upon discharge. They verbalized and demonstrated their understanding of all information provided.   FIM:  Comprehension Comprehension Mode: Auditory Comprehension: 6-Follows complex conversation/direction: With extra time/assistive device Expression Expression Mode: Verbal Expression: 4-Expresses basic 75 - 89% of the time/requires cueing 10 - 24% of the time. Needs helper to occlude trach/needs to repeat words. Social Interaction Social Interaction: 6-Interacts appropriately with others with medication or extra time (anti-anxiety, antidepressant). Problem Solving Problem Solving: 6-Solves complex problems: With extra time Memory Memory: 6-More than reasonable amt of time FIM -  Eating Eating Activity: 5: Supervision/cues;5: Set-up assist for open containers  Pain Pain Assessment Pain Assessment: No/denies pain Pain Score: 0-No pain Faces Pain Scale: No hurt  Therapy/Group: Individual Therapy and Group Therapy   Joshua Horton, M.A. CCC-SLP [phone]   Joshua Horton 11/28/2013, 1:24 PM

## 2013-11-28 NOTE — Progress Notes (Signed)
Dr. Leonie Man has evaluated CT head and cleared patient for low dose ASA as films show much improvement.

## 2013-11-28 NOTE — Discharge Summary (Signed)
Physician Discharge Summary  Patient ID: Joshua Horton MRN: [id number] DOB/AGE: Jun 21, 1946 68 y.o.  Admit date: 11/21/2013 Discharge date: 11/28/2013  Discharge Diagnoses:  Principal Problem:   CVA (cerebral infarction) Active Problems:   HTN (hypertension)   Hyperlipemia   Hip pain, bilateral--chronic   Discharged Condition: Stable  Significant Diagnostic Studies: Ct Head Wo Contrast  11/26/2013   CLINICAL DATA:  Follow-up intracranial hemorrhage. History of recent right middle cerebral artery distribution infarcts and right occipital hemorrhage post tPA.  EXAM: CT HEAD WITHOUT CONTRAST  TECHNIQUE: Contiguous axial images were obtained from the base of the skull through the vertex without intravenous contrast.  COMPARISON:  11/18/2013, 11/17/2013 and 11/16/2013.  FINDINGS: Acute/subacute right middle cerebral artery distribution infarct most prominent right frontal operculum/ corona radiata region once again noted.  Interval further red cell lysis is of hematoma involving the posterior right temporal-occipital lobe. Central aspect of the hematoma now measures up to 1 cm versus prior 2.5 cm. Mild surrounding vasogenic edema.  Previously noted subarachnoid blood is less conspicuous although not completely cleared.  No new intracranial hemorrhage noted.  Remote left basal ganglia/ corona radiata infarct.  Small vessel disease type changes.  Mild atrophy without hydrocephalus.  No intracranial mass lesion noted on this unenhanced exam.  IMPRESSION: Acute/subacute right middle cerebral artery distribution infarct most prominent right frontal operculum/ corona radiata region once again noted.  Interval further red cell lysis is of hematoma involving the posterior right temporal-occipital lobe. Central aspect of the hematoma now measures up to 1 cm versus prior 2.5 cm. Mild surrounding vasogenic edema.  Previously noted subarachnoid blood is less conspicuous although not completely cleared.  No new  intracranial hemorrhage noted.   Electronically Signed   By: Chauncey Cruel M.D.   On: 11/26/2013 15:14    Labs:  Basic Metabolic Panel:  Recent Labs Lab 11/28/13 0557 11/28/13 0910  NA  --  142  K  --  4.3  CL  --  103  CO2  --  24  GLUCOSE  --  116*  BUN  --  15  CREATININE 0.93 0.88  CALCIUM  --  9.9    CBC: No results found for this basename: WBC, NEUTROABS, HGB, HCT, MCV, PLT,  in the last 168 hours    Brief HPI:   Joshua Horton is a 68 y.o. Left handed male with history of CAD, HTN, COPD; who was admitted on 11/16/13 with acute onset of speech difficulty and left sided weakness. CT head without acute changes and he ws treated with tPA. MRI/MRA brain with scattered acute right MCA infarcts, most confluent at the posterior insula superimposed rigth occipital lobe hemorrhagic infarct with focal hematoma 30 X 19 X 13 mm, trace SAH over posterior right hemisphere, R MCA M2 segment occlusion/stenosis and suspicion of plaque or thrombus supraclinoid R- ICA causing mild to moderate stenosis.  MBS done on 01/03 showing multifactorial dysphagia with delayed transit and moderate residue therefore placed on D1, thin liquids. 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 TEE done revealing EF 65% and negative for ASD, PFO or LAA thrombus. Loop recorder placed by Dr. Caryl Comes on 11/19/13. Rehab team recommended CIR for progression.     Hospital Course: Joshua Horton was admitted to rehab 11/21/2013 for inpatient therapies to consist of PT, ST and OT at least three hours five days a week. Past admission physiatrist, therapy team and rehab RN have worked together to provide customized  collaborative inpatient rehab. Blood pressures have been well controlled on Norvasc alone.  Follow up CCT done a week later showed decrease in bleed and he was cleared to start low dose ASA. He has had complaints of chronic bilateral hip pain and Voltaren gel has been  effective in providing pain management.  He was educated on carb modified diet due to evidence of pre diabetes.  He was advanced to dysphagia 2 diet and was tolerating this without difficulty. He has had improvement in LLE weakness and is showing improvement in left shoulder and elbow flexion as well as left wrist flexion with 50% finger flexion. He has made good progress during his stay and is at supervision level overall. Wife is supportive and has been present for most therapy sessions and has completed family education.  He will continue to receive outpatient PT,OT,ST at  [facility] course: During patient's stay in rehab weekly team conferences were held to monitor patient's progress, set goals and discuss barriers to discharge. Patient has had improvement in activity tolerance, balance, postural control, as well as ability to compensate for deficits. He has had improvement in functional use LUE  And LLE as well as improved awareness. He has been advanced to Dysphagia 2 textures and thin liquids and requires supervision level cueing for utilization of safe swallowing strategies. He is utilizing speech intelligibility strategies with Min cues, although he and his wife feel as though his speech is close to baseline. He is at supervision level for bathing and dressing tasks. He is able to ambulate 300 feet with supervision.  BERG balance score 46/56.  DGI score 19/24. HEP plan was given to patient and wife to help work on balance past discharge.    Disposition: 01-Home or Self Care  Diet: Chopped food (dysphagia 2). Monitor sweet and starch intake.   Special Instructions: 1. Outpatient therapy: [facility] 12/03/13 at 1:00 pm.        Future Appointments Provider Department Dept Phone   12/03/2013 1:00 PM Ephraim Hamburger, [place] [phone]   Joint Appt Camila Li, [place] [phone]   12/25/2013  10:00 AM Andrez Grime, PA-C [place] [phone]   12/27/2013 10:00 AM Bary Leriche, PA-C [facility] [phone]   01/23/2014 10:00 AM [facility] [place] [phone]   01/23/2014 10:30 AM Angelia Mould, MD [facility] [phone]   02/14/2014 2:15 PM Garvin Fila, MD [facility] [phone]       Medication List         amLODipine 5 MG tablet  Commonly known as:  NORVASC  Take 1 tablet (5 mg total) by mouth daily. For blood pressure     aspirin 81 MG EC tablet  Take 1 tablet (81 mg total) by mouth daily.     diclofenac sodium 1 % Gel  Commonly known as:  VOLTAREN  Apply 2 g topically 3 (three) times daily before meals. To bilateral hips.     hydrocerin Crea  Apply 1 application topically 2 (two) times daily.     hydrocortisone 25 MG suppository  Commonly known as:  ANUSOL-HC  Place 1 suppository (25 mg total) rectally 2 (two) times daily.     nitroGLYCERIN 0.4 MG SL tablet  Commonly known as:  NITROSTAT  Place 0.4 mg under the tongue every 5 (five) minutes  as needed for chest pain.     omeprazole 20 MG capsule  Commonly known as:  PRILOSEC  Take 20 mg by mouth daily.     simvastatin 40 MG tablet  Commonly known as:  ZOCOR  Take 40 mg by mouth every evening.       Follow-up Information   Follow up with Bary Leriche, PA-C On 12/27/2013. (Be there at 9:30  for 10 am  appointment)    Specialty:  Physical Medicine and Rehabilitation   Contact information:   [place] [address] [phone]       Follow up with Forbes Cellar, MD. Call today. (for follow up appointment in 4- 6 weeks. )    Specialties:  Neurology, Radiology   Contact information:   [place] [address] [phone]       Follow up with Virl Axe, MD. Call today. (for follow up in  2-3 weeks. )    Specialty:  Cardiology   Contact information:   [address] [place] [address] [phone]       Follow up with Gar Ponto, MD On 12/12/2013. (@ 12:00 PM)    Specialty:  Family Medicine   Contact information:   [address] [place] [address] [phone]       Signed: Bary Leriche 11/30/2013, 7:11 PM

## 2013-11-28 NOTE — Progress Notes (Signed)
Speech Language Pathology Discharge Summary  Patient Details  Name: Joshua Horton MRN: [id number] Date of Birth: 10/07/1946  Today's Date: 11/28/2013  Patient has met 3 of 3 long term goals.  Patient to discharge at overall Supervision;Min level.  Reasons goals not met: N/A   Clinical Impression/Discharge Summary: Pt has met 3 out of 3 LTGs during this admission with improvements in speech and swallowing skills. Pt is discharging on Dys 2 textures and thin liquids with supervision level cueing for utilization of safe swallowing strategies. He utilizes speech intelligibility strategies with Min cues, although he and his wife feel as though his speech is close to baseline. Education has been completed with patient and wife. Pt is scheduled to discharge home today with family support. He will benefit from continued, brief SLP services to advance diet textures and maximize swallowing safety.  Care Partner:  Caregiver Able to Provide Assistance: Yes     Recommendation:  Outpatient SLP  Rationale for SLP Follow Up: Maximize swallowing safety   Equipment:  None recommended by SLP  Reasons for discharge: Treatment goals met;Discharged from hospital   Patient/Family Agrees with Progress Made and Goals Achieved: Yes   See FIM for current functional status   Germain Osgood, M.A. CCC-SLP [phone]   Germain Osgood 11/28/2013, 4:52 PM

## 2013-11-28 NOTE — Progress Notes (Signed)
Social Work Discharge Note  The overall goal for the admission was met for:   Discharge location: Yes - home  Length of Stay: Yes - 7 days  Discharge activity level: Yes - Supervision  Home/community participation: Yes  Services provided included: MD, RD, PT, OT, SLP, CM, TR and Pharmacy  Financial Services: Private Insurance: [employer] Medicare  Follow-up services arranged: Outpatient: PT, OT, ST, DME: Shower chair and Patient/Family request agency Therapy: [facility], DME: [facility]  Comments (or additional information):  Patient/Family verbalized understanding of follow-up arrangements: Yes  Individual responsible for coordination of the follow-up plan: pt and wife  Confirmed correct DME delivered: ,  [facility] 11/28/2013    ,  [facility] 

## 2013-11-28 NOTE — Patient Care Conference (Signed)
Inpatient RehabilitationTeam Conference and Plan of Care Update Date: 11/28/2013   Time: 10:35 AM    Patient Name: Joshua Horton [facility]      Medical Record Number: [id number]  Date of Birth: 02-Aug-1946 Sex: Male         Room/Bed: 4M04C/4M04C-01 Payor Info: Payor: [employer] / Plan: AARP MEDICARE COMPLETE / Product Type: *No Product type* /    Admitting Diagnosis: R MCA INFARCT  Admit Date/Time:  11/21/2013  6:18 PM Admission Comments: No comment available   Primary Diagnosis:  CVA (cerebral infarction) Principal Problem: CVA (cerebral infarction)  Patient Active Problem List   Diagnosis Date Noted  . Hip pain, bilateral--chronic 11/22/2013  . CVA (cerebral infarction) 11/21/2013  . Embolic cerebral infarction   . Unspecified cerebral artery occlusion with cerebral infarction 11/16/2013  . COPD (chronic obstructive pulmonary disease) 08/27/2013  . CAD S/P percutaneous coronary angioplasty 07/26/2013  . HTN (hypertension) 07/26/2013  . Hyperlipemia 07/26/2013  . History of tobacco use in home 07/26/2013  . Atherosclerosis of native arteries of the extremities with intermittent claudication 08/23/2012    Expected Discharge Date: Expected Discharge Date: 11/28/13  Team Members Present: Physician leading conference: Dr. Alysia Penna Social Worker Present: Ovidio Kin, LCSW;Jenny [place] Jarquin, LCSW Nurse Present: Other (comment) Henri Medal, RN) PT Present: Other (comment);Georjean Mode, PT ([address] [place], [place]) OT Present: Forde Radon, Roland Earl, OT SLP Present: Germain Osgood, SLP PPS Coordinator present : Daiva Nakayama, RN, CRRN     Current Status/Progress Goal Weekly Team Focus  Medical   Sup fxnl, still with dysphagia  Stabilize for D/C  family training   Bowel/Bladder   continent of B/B; LBM 1/12  Continent of bowel and bladder  Remain continent of bowel and bladder   Swallow/Nutrition/ Hydration   DIII diet thin liquids; full supervision; wife  signed off  least restrictive p.o. intake   trials of Dys.3   ADL's   supervision  supervision  ready for discharge   Mobility   Mod I bed mobiltiy and transfers, S gait >150' and S flight of stairs  Mod I bed mobility, S transfers, gait and stairs  Pt DC'd with goals met   Communication   Min assist   Min assist   education   Safety/Cognition/ Behavioral Observations  WFL         Pain   denies  <2  monitor q shift for pain   Skin   Dry skin with eucerin in use  No new skin breakdown  Continue to use eucering to dry areas    Rehab Goals Patient on target to meet rehab goals: Yes Rehab Goals Revised: None *See Care Plan and progress notes for long and short-term goals.  Barriers to Discharge: none except above    Possible Resolutions to Barriers:  D/C wi9thin 24 hours    Discharge Planning/Teaching Needs:  Pt to d/c to home with wife 1/14  Family is here most of the time with pt and can participate in family education at anytime.   Team Discussion:  Pt is medically stable and ready for d/c.  Pt is independent in his room with his wife.  Pt will need follow up outpatient therapies and a shower chair.    Revisions to Treatment Plan:  Pt is ready for d/c today after therapies.   Continued Need for Acute Rehabilitation Level of Care: The patient requires daily medical management by a physician with specialized training in physical medicine and rehabilitation for the following conditions: Daily  direction of a multidisciplinary physical rehabilitation program to ensure safe treatment while eliciting the highest outcome that is of practical value to the patient.: Yes Daily medical management of patient stability for increased activity during participation in an intensive rehabilitation regime.: Yes Daily analysis of laboratory values and/or radiology reports with any subsequent need for medication adjustment of medical intervention for : Neurological problems  Allison Silva, Silvestre Mesi 11/30/2013, 9:17 AM

## 2013-11-28 NOTE — Discharge Instructions (Signed)
Inpatient Rehab Discharge Instructions  [place] Discharge date and time:  11/28/13  Activities/Precautions/ Functional Status: Activity: activity as tolerated Diet: cardiac diet--Chopped foods. No straws. Check for left cheek pocketing. Medications crushed in puree.  Wound Care: none needed  Functional status:  ___ No restrictions     ___ Walk up steps independently ___ 24/7 supervision/assistance   ___ Walk up steps with assistance _X__ Intermittent supervision/assistance  ___ Bathe/dress independently ___ Walk with walker     ___ Bathe/dress with assistance ___ Walk Independently    ___ Shower independently ___ Walk with assistance    ___ Shower with assistance _X__ No alcohol     ___ Return to work/school ________  COMMUNITY REFERRALS UPON DISCHARGE:    Outpatient: PT      OT       ST  Agency:  [facility] Phone:  [phone]  Appointment Date/Time:  12-03-13 @ 1:00 PM (for Physical and Speech Therapy.  You will get your Occupational Therapy appointment that day.) Medical Equipment/Items Ordered:  Shower chair             Agency:  [facility]     Phone:  [phone]   GENERAL COMMUNITY RESOURCES FOR PATIENT/FAMILY: Support Groups:  [facility] Stroke Support Group  Special Instructions: 1. No driving.  2. Wash incision with soap and water --keep clean and dry.   STROKE/TIA DISCHARGE INSTRUCTIONS SMOKING Cigarette smoking nearly doubles your risk of having a stroke & is the single most alterable risk factor  If you smoke or have smoked in the last 12 months, you are advised to quit smoking for your health.  Most of the excess cardiovascular risk related to smoking disappears within a year of stopping.  Ask you doctor about anti-smoking medications  [place] Quit Line: 1-800-QUIT NOW  Free Smoking Cessation Classes [phone]  CHOLESTEROL Know your levels; limit fat & cholesterol in your diet  Lipid Panel     Component  Value Date/Time   CHOL 169 11/16/2013 0402   TRIG 215* 11/16/2013 0402   HDL 32* 11/16/2013 0402   CHOLHDL 5.3 11/16/2013 0402   VLDL 43* 11/16/2013 0402   LDLCALC 94 11/16/2013 0402      Many patients benefit from treatment even if their cholesterol is at goal.  Goal: Total Cholesterol (CHOL) less than 160  Goal:  Triglycerides (TRIG) less than 150  Goal:  HDL greater than 40  Goal:  LDL (LDLCALC) less than 100   BLOOD PRESSURE American Stroke Association blood pressure target is less that 120/80 mm/Hg  Your discharge blood pressure is:  BP: 105/62 mmHg  Monitor your blood pressure  Limit your salt and alcohol intake  Many individuals will require more than one medication for high blood pressure  DIABETES (A1c is a blood sugar average for last 3 months) Goal HGBA1c is under 7% (HBGA1c is blood sugar average for last 3 months)  Diabetes: Pre diabetic    Lab Results  Component Value Date   HGBA1C 6.5* 11/16/2013     Your HGBA1c can be lowered with medications, healthy diet, and exercise.  Check your blood sugar as directed by your physician  Call your physician if you experience unexplained or low blood sugars.  PHYSICAL ACTIVITY/REHABILITATION Goal is 30 minutes at least 4 days per week  Activity: No driving, Therapies: See above Return to work: N/A  Activity decreases your risk of heart attack and stroke and makes your heart  stronger.  It helps control your weight and blood pressure; helps you relax and can improve your mood.  Participate in a regular exercise program.  Talk with your doctor about the best form of exercise for you (dancing, walking, swimming, cycling).  DIET/WEIGHT Goal is to maintain a healthy weight  Your discharge diet is: Dysphagia thin liquids Your height is:  5'10" Your current weight is: 96.5 kg Your Body Mass Index (BMI) is:  30.5  Following the type of diet specifically designed for you will help prevent another stroke.  Your goal weight range is:  173.9 lbs  Your goal Body Mass Index (BMI) is 19-24.  Healthy food habits can help reduce 3 risk factors for stroke:  High cholesterol, hypertension, and excess weight.  RESOURCES Stroke/Support Group:  Call [phone]   STROKE EDUCATION PROVIDED/REVIEWED AND GIVEN TO PATIENT Stroke warning signs and symptoms How to activate emergency medical system (call 911). Medications prescribed at discharge. Need for follow-up after discharge. Personal risk factors for stroke. Pneumonia vaccine given:  Flu vaccine given:  My questions have been answered, the writing is legible, and I understand these instructions.  I will adhere to these goals & educational materials that have been provided to me after my discharge from the hospital.       My questions have been answered and I understand these instructions. I will adhere to these goals and the provided educational materials after my discharge from the hospital.  Patient/Caregiver Signature _______________________________ Date __________  Clinician Signature _______________________________________ Date __________  Please bring this form and your medication list with you to all your follow-up doctor's appointments.

## 2013-11-28 NOTE — Progress Notes (Signed)
1420 Pt. Discharged to home with wife.  All discharge instructions provided by Algis Liming, PA.  Patient and family ask no further questions.  All items packed and in tow.  NT escorted patient and family to vehicle.

## 2013-11-29 NOTE — Progress Notes (Signed)
Social Work Patient ID: Joshua Horton, male   DOB: March 12, 1946, 68 y.o.   MRN: [id number]  Late entry for 11-28-13: Met with pt and his wife after team conference to inform them that pt could leave after therapies are completed this afternoon.  Pt was excited about this and pt's wife feels comfortable with driving in the inclement weather, as long as she gets home before it gets dark.  CSW arranged outpatient therapies at [facility] per pt's request.  Also ordered pt a shower chair and gave him information on Stroke Support Group.  Pt and wife were appreciative of CSW support and were eager to get home.

## 2013-12-03 ENCOUNTER — Ambulatory Visit (HOSPITAL_COMMUNITY)
Admission: RE | Admit: 2013-12-03 | Discharge: 2013-12-03 | Disposition: A | Discharge status: Home / Self Care | Payer: Medicare Other | Source: Ambulatory Visit | Attending: Physical Medicine & Rehabilitation | Admitting: Physical Medicine & Rehabilitation

## 2013-12-03 ENCOUNTER — Ambulatory Visit (HOSPITAL_COMMUNITY)
Admit: 2013-12-03 | Discharge: 2013-12-03 | Disposition: A | Discharge status: Home / Self Care | Payer: Medicare Other | Source: Ambulatory Visit | Attending: Physical Medicine & Rehabilitation | Admitting: Physical Medicine & Rehabilitation

## 2013-12-03 DIAGNOSIS — R279 Unspecified lack of coordination: Secondary | ICD-10-CM

## 2013-12-03 DIAGNOSIS — J4489 Other specified chronic obstructive pulmonary disease: Secondary | ICD-10-CM | POA: Insufficient documentation

## 2013-12-03 DIAGNOSIS — M6281 Muscle weakness (generalized): Secondary | ICD-10-CM | POA: Insufficient documentation

## 2013-12-03 DIAGNOSIS — Z5189 Encounter for other specified aftercare: Secondary | ICD-10-CM | POA: Insufficient documentation

## 2013-12-03 DIAGNOSIS — I69998 Other sequelae following unspecified cerebrovascular disease: Secondary | ICD-10-CM | POA: Insufficient documentation

## 2013-12-03 DIAGNOSIS — I69922 Dysarthria following unspecified cerebrovascular disease: Secondary | ICD-10-CM | POA: Insufficient documentation

## 2013-12-03 DIAGNOSIS — R29898 Other symptoms and signs involving the musculoskeletal system: Secondary | ICD-10-CM | POA: Insufficient documentation

## 2013-12-03 DIAGNOSIS — R269 Unspecified abnormalities of gait and mobility: Secondary | ICD-10-CM | POA: Insufficient documentation

## 2013-12-03 DIAGNOSIS — R262 Difficulty in walking, not elsewhere classified: Secondary | ICD-10-CM | POA: Insufficient documentation

## 2013-12-03 DIAGNOSIS — IMO0002 Reserved for concepts with insufficient information to code with codable children: Secondary | ICD-10-CM | POA: Insufficient documentation

## 2013-12-03 DIAGNOSIS — I10 Essential (primary) hypertension: Secondary | ICD-10-CM | POA: Insufficient documentation

## 2013-12-03 DIAGNOSIS — J449 Chronic obstructive pulmonary disease, unspecified: Secondary | ICD-10-CM | POA: Insufficient documentation

## 2013-12-03 NOTE — Evaluation (Signed)
Physical Therapy Evaluation  Patient Details  Name: Joshua Horton MRN: [id number] Date of Birth: [birth year]  Today's Date: 12/03/2013 Time: 1345-1405 PT Time Calculation (min): 20 min Charges: 1 evaluation             Visit#: 1 of 8  Re-eval: 01/02/14 Assessment Diagnosis: Lt sided weakness from Rt CVA Surgical Date: 11/16/13 (DOI) Next MD Visit: Dr. Letta Pate - 12/27/13  Authorization: [facility] Medicare    Authorization Time Period:    Authorization Visit#: 1 of 10   Past Medical History:  Past Medical History  Diagnosis Date  . Coronary artery disease   . Wheezing   . Chronic cough   . Peripheral vascular disease   . Hypertension   . Hyperlipidemia   . Reflux   . COPD (chronic obstructive pulmonary disease)   . Myocardial infarction   . CHF (congestive heart failure)   . Embolic cerebral infarction     S/P IV t-PA,  post-tPA hemorrhagic transformation,clinically asymptomatic, scattered right MCA ischemic infarcts, right occipital lobe hemorrhagic infarction with focal hematoma, small volume of right subarachnoid hemorrhage    Past Surgical History:  Past Surgical History  Procedure Laterality Date  . Coronary angioplasty with stent placement  2003  . Cervical disc surgery      Dr Vertell Limber  . Tee without cardioversion N/A 11/19/2013    Procedure: TRANSESOPHAGEAL ECHOCARDIOGRAM (TEE);  Surgeon: Josue Hector, MD;  Location: [facility] ENDOSCOPY;  Service: Cardiovascular;  Laterality: N/A;  . Loop recorder implant  11/19/2013    MDT LinQ implanted by Dr Caryl Comes for cryptogenic stroke    Subjective Symptoms/Limitations Pertinent History: Joshua Horton is a 68 y.o. Left handed male with history of CAD, HTN, COPD; who was admitted on 11/16/13 with acute onset of speech difficulty and left sided weakness. Patient reported similar transient symptoms on 11/03/13. CT head without acute changes. Treated with tPA. MRI/MRA brain with scattered acute right MCA infarcts, most confluent at the  posterior insula superimposed rigth occipital lobe hemorrhagic infarct with focal hematoma 30 X 19 X 13 mm, trace SAH over posterior right hemisphere, R MCA M2 segment occlusion/stenosis and suspicion of plaque or thrombus supraclinoid R- ICA causing mild to moderate stenosis. Carotid dopplers with 40-59% R--ICA stenosis and 1-39% L-ICA stenosis. 2D echo with EF 65-70% with grade 1 diastolic dysfunction and no wall abnormality. 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  Pt attedned CIR from 11/21/13-11/29/13 when he was d/c to his home. He is now doing well at home and his c/co is using his cane for "safety" outdoors and most difficulty with Lt leg.  Limitations: Walking;Standing How long can you sit comfortably?: no difficulty  How long can you stand comfortably?: 30 minutes How long can you walk comfortably?: w/SPC 10-15 mintutes Patient Stated Goals: improve balance, walk better Pain Assessment Currently in Pain?: No/denies  Precautions/Restrictions  Precautions Precautions: Fall Precaution Comments: risk for subluxation and injury to left hand due to decr ROM and sensation  Balance Screening Balance Screen Has the patient fallen in the past 6 months: No Has the patient had a decrease in activity level because of a fear of falling? : No Is the patient reluctant to leave their home because of a fear of falling? : No  Prior Functioning  Home Living Living Arrangements: Spouse/significant other Available Help at Discharge: Family;Available 24 hours/day Type of Home: House Home Access: Stairs to enter [employer] of Steps: 2 Entrance Stairs-Rails: Right;Left  Home Layout: Two level Alternate Level Stairs-Number of Steps:  (can live on main floor)  Lives With: Spouse Prior Function Vocation: Retired Comments: Enjoys hunting and fishing  Cognition/Observation Cognition Overall Cognitive Status: Within Advertising copywriter for tasks  assessed Arousal/Alertness: Awake/alert Orientation Level: Oriented X4 Memory: Appears intact Awareness: Appears intact Problem Solving: Appears intact Safety/Judgment: Appears intact Observation/Other Assessments Observations: 5 sit to stands w/o UE A: 10 seconds  Sensation/Coordination/Flexibility/Functional Tests Functional Tests Functional Tests: FOTO: 49/51  Assessment RLE Assessment RLE Assessment: Within Functional Limits LLE Assessment LLE Assessment: Within Functional Limits  Mobility/Balance  Ambulation/Gait Assistive device: None Gait Pattern: Lateral hip instability;Decreased dorsiflexion - left Static Standing Balance Single Leg Stance - Right Leg: 4 Single Leg Stance - Left Leg: 0 Tandem Stance - Right Leg: 5 Tandem Stance - Left Leg: 0 Rhomberg - Eyes Opened: 60 Rhomberg - Eyes Closed: 60 Berg Balance Test Sit to Stand: Able to stand without using hands and stabilize independently Standing Unsupported: Able to stand safely 2 minutes Sitting with Back Unsupported but Feet Supported on Floor or Stool: Able to sit safely and securely 2 minutes Stand to Sit: Sits safely with minimal use of hands Transfers: Able to transfer safely, minor use of hands Standing Unsupported with Eyes Closed: Able to stand 10 seconds safely Standing Ubsupported with Feet Together: Able to place feet together independently and stand 1 minute safely From Standing, Reach Forward with Outstretched Arm: Can reach forward >12 cm safely (5") From Standing Position, Pick up Object from Floor: Able to pick up shoe safely and easily From Standing Position, Turn to Look Behind Over each Shoulder: Looks behind from both sides and weight shifts well Turn 360 Degrees: Able to turn 360 degrees safely one side only in 4 seconds or less Standing Unsupported, Alternately Place Feet on Step/Stool: Able to stand independently and safely and complete 8 steps in 20 seconds Standing Unsupported, One Foot  in Front: Able to take small step independently and hold 30 seconds Standing on One Leg: Tries to lift leg/unable to hold 3 seconds but remains standing independently Total Score: 49 Timed Up and Go Test TUG: Normal TUG Normal TUG (seconds): 13 (independent)     Physical Therapy Assessment and Plan PT Assessment and Plan Clinical Impression Statement: Pt is a Lt handed 68 year old male referred to PT s/p Rt MCA infarcts, most confluent at the posterior insula superimposed rigth occipital lobe hemorrhagic infarct with focal hematoma 30 X 19 X 13 mm, trace SAH over posterior right hemisphere on 11/16/13 with impairments listed below.  At this time pt has most difficulty with high level balance activities and decreased confidence with walking.  Pt will benefit from skilled therapeutic intervention in order to improve on the following deficits: Decreased activity tolerance;Cardiopulmonary status limiting activity;Decreased balance;Decreased coordination;Impaired perceived functional ability Rehab Potential: Good PT Frequency: Min 2X/week PT Duration: 4 weeks PT Treatment/Interventions: DME instruction;Gait training;Functional mobility training;Balance training;Neuromuscular re-education;Patient/family education PT Plan: NuStep for activity tolerance Continue with high level activities including side stepping, retro gait, standng on foam with knee flexion and marching, head turns. Continue to progress high level activities to tall kneeling, quadruped (to incorporate LUE).     Goals Home Exercise Program Pt/caregiver will Perform Home Exercise Program: Independently;For improved balance PT Goal: Perform Home Exercise Program - Progress: Goal set today PT Short Term Goals Time to Complete Short Term Goals: 2 weeks PT Short Term Goal 1: Pt will improve is proprioceptive awareness and demonstrate tandem stance on  soild surface x30 seconds on BLE to decrease risk of falls.  PT Short Term Goal 2: Pt will  improve his dymanic balance in order to ambulate independently outdoors without cuening for proper posture in order to progress towards hunting and fishing activities.  PT Long Term Goals Time to Complete Long Term Goals: 4 weeks PT Long Term Goal 1: Pt will improve FOTO  status to greater than 60% for improved percieved functional ability.  PT Long Term Goal 2: Pt will improve Berg balance score to greater than 52/56 for improved safely in the community to decrease risk of falls.   Problem List Patient Active Problem List   Diagnosis Date Noted  . Dysarthria due to cerebrovascular accident 12/03/2013  . Difficulty in walking(719.7) 12/03/2013  . Hip pain, bilateral--chronic 11/22/2013  . CVA (cerebral infarction) 11/21/2013  . Embolic cerebral infarction   . Unspecified cerebral artery occlusion with cerebral infarction 11/16/2013  . COPD (chronic obstructive pulmonary disease) 08/27/2013  . CAD S/P percutaneous coronary angioplasty 07/26/2013  . HTN (hypertension) 07/26/2013  . Hyperlipemia 07/26/2013  . History of tobacco use in home 07/26/2013  . Atherosclerosis of native arteries of the extremities with intermittent claudication 08/23/2012    PT - End of Session Activity Tolerance: Patient tolerated treatment well General Behavior During Therapy: [facility] for tasks assessed/performed PT Plan of Care PT Home Exercise Plan: Not Given Consulted and Agree with Plan of Care: Patient  GP Functional Assessment Tool Used: FOTO Functional Limitation: Mobility: Walking and moving around Mobility: Walking and Moving Around Current Status [id number]): At least 40 percent but less than 60 percent impaired, limited or restricted Mobility: Walking and Moving Around Goal Status [phone]): At least 40 percent but less than 60 percent impaired, limited or restricted  Levy Wellman, MPT 12/03/2013, 3:40 PM  Physician Documentation Your signature is required to indicate approval of the treatment plan as  stated above.  Please sign and either send electronically or make a copy of this report for your files and return this physician signed original.   Please mark one 1.__approve of plan  2. ___approve of plan with the following conditions.   ______________________________                                                          _____________________ Physician Signature                                                                                                             Date

## 2013-12-03 NOTE — Evaluation (Addendum)
Speech Language Pathology Evaluation Patient Details  Name: Joshua Horton MRN: [id number] Date of Birth: 03-30-1946  Today's Date: 12/03/2013 Time: 4332-9518 SLP Time Calculation (min): 45 min  Authorization: [facility] Medicare  Authorization Time Period: 12/03/2013-12/31/2013  Authorization Visit#: Authorization - Visit Number: 1 of 5   Past Medical History:  Past Medical History  Diagnosis Date  . Coronary artery disease   . Wheezing   . Chronic cough   . Peripheral vascular disease   . Hypertension   . Hyperlipidemia   . Reflux   . COPD (chronic obstructive pulmonary disease)   . Myocardial infarction   . CHF (congestive heart failure)   . Embolic cerebral infarction     S/P IV t-PA,  post-tPA hemorrhagic transformation,clinically asymptomatic, scattered right MCA ischemic infarcts, right occipital lobe hemorrhagic infarction with focal hematoma, small volume of right subarachnoid hemorrhage    Past Surgical History:  Past Surgical History  Procedure Laterality Date  . Coronary angioplasty with stent placement  2003  . Cervical disc surgery      Dr Vertell Limber  . Tee without cardioversion N/A 11/19/2013    Procedure: TRANSESOPHAGEAL ECHOCARDIOGRAM (TEE);  Surgeon: Josue Hector, MD;  Location: [facility] ENDOSCOPY;  Service: Cardiovascular;  Laterality: N/A;  . Loop recorder implant  11/19/2013    MDT LinQ implanted by Dr Caryl Comes for cryptogenic stroke   HPI:  HPI: Joshua Horton is a 68 y.o. male presenting with difficulty with speech, left sided weakness. S/P IV t-PA 11/16/2013 at 0058. MRI shows Scattered acute right MCA infarcts, most confluent at the posterior insula; Superimposed right occipital lobe hemorrhagic infarction with focal hematoma encompassing 30 x 19 x 13 mm. Superimposed small volume of subarachnoid hemorrhage in the right hemisphere. Symptoms/Limitations Symptoms: slurred speech Special Tests: informal cog/ling/swallowing Pain Assessment Currently in Pain?:  No/denies  Prior Functional Status  Cognitive/Linguistic Baseline: Within functional limits Type of Home: House  Lives With: Spouse Available Help at Discharge: Family;Available 24 hours/day Vocation: Retired  Dealer Screen Has the patient fallen in the past 6 months: No Has the patient had a decrease in activity level because of a fear of falling? : No Is the patient reluctant to leave their home because of a fear of falling? : No  Cognition  Overall Cognitive Status: Within Functional Limits for tasks assessed Arousal/Alertness: Awake/alert Orientation Level: Oriented X4 Memory: Appears intact Awareness: Appears intact Problem Solving: Appears intact Safety/Judgment: Appears intact  Comprehension  Auditory Comprehension Overall Auditory Comprehension: Appears within functional limits for tasks assessed Yes/No Questions: Within Functional Limits Commands: Within Functional Limits Conversation: Complex Visual Recognition/Discrimination Discrimination: Within Function Limits Reading Comprehension Reading Status: Not tested  Expression  Expression Primary Mode of Expression: Verbal Verbal Expression Overall Verbal Expression: Impaired Initiation: No impairment Repetition: No impairment Naming: No impairment Pragmatics: No impairment Interfering Components: Speech intelligibility Non-Verbal Means of Communication: Not applicable Other Verbal Expression Comments: negatively impacted by dysarthria Written Expression Dominant Hand: Left Written Expression: Not tested  Oral/Motor  Oral Motor/Sensory Function Overall Oral Motor/Sensory Function: Impaired Labial ROM: Reduced left Labial Symmetry: Abnormal symmetry left Labial Strength: Reduced Labial Sensation: Reduced Lingual ROM: Reduced left Lingual Symmetry: Within Functional Limits Lingual Strength: Reduced Lingual Sensation: Within Functional Limits Facial ROM: Reduced left Facial  Symmetry: Left droop Facial Strength: Reduced Facial Sensation: Reduced Velum: Within Functional Limits Mandible: Within Functional Limits Motor Speech Overall Motor Speech: Impaired Respiration: Impaired Level of Impairment: Sentence Phonation: Normal Resonance: Within functional limits Articulation: Impaired Level  of Impairment: Sentence Intelligibility: Intelligibility reduced Word: 75-100% accurate Phrase: 75-100% accurate Sentence: 75-100% accurate Conversation: 75-100% accurate Motor Planning: Witnin functional limits Motor Speech Errors: Not applicable Interfering Components: Inadequate dentition;Premorbid status Effective Techniques: Slow rate;Over-articulate;Pause  SLP Goals  Home Exercise SLP Goal: Patient will Perform Home Exercise Program: Independently SLP Short Term Goals SLP Short Term Goal 1: Pt will complete oral motor exercises 2x/daily as part of home program with written reminders as needed. SLP Short Term Goal 2: Pt will demonstrate safe and efficient consumption of mechanical soft texures and thin liquids with min cues for compensatory strategies.  SLP Short Term Goal 3: Pt will implement speech intelligibility strategies during structured sentence-level oral reading tasks with 90% acc as judged by clinician. SLP Short Term Goal 4: Pt will increase conversation-level speech intelligibility to >90% with min cues for strategies. SLP Long Term Goals SLP Long Term Goal 1: Same as short term  Assessment/Plan  Patient Active Problem List   Diagnosis Date Noted  . Dysarthria due to cerebrovascular accident 12/03/2013  . Hip pain, bilateral--chronic 11/22/2013  . CVA (cerebral infarction) 11/21/2013  . Embolic cerebral infarction   . Unspecified cerebral artery occlusion with cerebral infarction 11/16/2013  . COPD (chronic obstructive pulmonary disease) 08/27/2013  . CAD S/P percutaneous coronary angioplasty 07/26/2013  . HTN (hypertension) 07/26/2013  .  Hyperlipemia 07/26/2013  . History of tobacco use in home 07/26/2013  . Atherosclerosis of native arteries of the extremities with intermittent claudication 08/23/2012   SLP - End of Session Activity Tolerance: Patient tolerated treatment well General Behavior During Therapy: WFL for tasks assessed/performed  SLP Assessment/Plan Clinical Impression Statement: Pt presents with dysarthric speech secondary to left facial, lingual and labial weakness following CVA. Pt has more difficulty with multisyllabic words and phrases as pt's intelligibility decreases to approximately 75% secondary to poor articulation. In conversational speech pt approximately 70% intelligible. Pt's wife reports that his speech is closer to baseline, but has more difficulty on the telephone.  Reviewed strategies to improve intellibility with slow pace and over articulation of words. Recommend skilled outpatient SLP intervention to increase pt's speech intelligibility with oral motor exercises and compensatory strategies. Pt currently consumes a D2 diet with thin liquids. Graham crackers trialed today during evaluation yielded occasional coughing. SLP will also follow pt for safe and efficient swallowing of highest recommended diet with use of strategies and exercises. Speech Therapy Frequency: min 2x/week Duration: 2 weeks Treatment/Interventions: Diet toleration management by SLP;SLP instruction and feedback;Compensatory strategies;Patient/family education;Oral motor exercises;Compensatory techniques;Trials of upgraded texture/liquids;Cueing hierarchy Potential to Achieve Goals: Good Potential Considerations: Other (comment) (motivation)  GN Functional Assessment Tool Used: clinical judgement Functional Limitations: Motor speech Motor Speech Current Status [phone]): At least 20 percent but less than 40 percent impaired, limited or restricted Motor Speech Goal Status [phone]): At least 1 percent but less than 20 percent  impaired, limited or restricted   Thank you,  Genene Churn, [place]   [place]ripp 12/03/2013, 2:40 PM  Physician Documentation Your signature is required to indicate approval of the treatment plan as stated above.  Please sign and either send electronically or make a copy of this report for your files and return this physician signed original.  Please mark one 1.__approve of plan  2. ___approve of plan with the following conditions.   ______________________________  _____________________ Physician Signature                                                                                                             Date

## 2013-12-07 ENCOUNTER — Ambulatory Visit (HOSPITAL_COMMUNITY)
Admission: RE | Admit: 2013-12-07 | Discharge: 2013-12-07 | Disposition: A | Discharge status: Home / Self Care | Payer: Medicare Other | Source: Ambulatory Visit | Attending: Family Medicine | Admitting: Family Medicine

## 2013-12-07 DIAGNOSIS — R262 Difficulty in walking, not elsewhere classified: Secondary | ICD-10-CM

## 2013-12-07 DIAGNOSIS — I639 Cerebral infarction, unspecified: Secondary | ICD-10-CM

## 2013-12-07 DIAGNOSIS — IMO0002 Reserved for concepts with insufficient information to code with codable children: Secondary | ICD-10-CM

## 2013-12-07 NOTE — Progress Notes (Signed)
Physical Therapy Treatment Patient Details  Name: Joshua Horton MRN: [id number] Date of Birth: 02-19-1946  Today's Date: 12/07/2013 Time: 1015-1100 PT Time Calculation (min): 45 min 1015-1100 - NMR   Visit#: 2 of 8  Re-eval: 01/02/14 Assessment Diagnosis: Lt sided weakness from Rt CVA Surgical Date: 11/16/13 Next MD Visit: Dr. Letta Pate - 12/27/13  Authorization: [facility] Medicare  Authorization Time Period:    Authorization Visit#: 2 of 10   Subjective: Symptoms/Limitations Symptoms: 0/10 pain , no new complaints   Precautions/Restrictions     Exercise/Treatments       Balance Exercises/ NMR  Standing Tandem Gait: 4 reps Retro Gait: 4 reps Sidestepping: 4 reps (, left shoulder abduction during L  side stepping ) Heel Raises: Both;10 reps;Limitations Heel Raises Limitations: with left arm overhead reach  Other Standing Exercises: walking carrying 2 dyna disks 2 laps in gym, walking carrying 3 lb ball 2 lpas in gym  Other Standing Exercises: forward lunge with L arm forward reach 2 x 10   Yoga Poses    Seated Other Seated Exercises: nu step 6 min warm up legs only   Supine       Physical Therapy Assessment and Plan PT Assessment and Plan Clinical Impression Statement: challenged with walking and carrying dyna disk and or 3 lb ball.  Pt will benefit from skilled therapeutic intervention in order to improve on the following deficits: Decreased activity tolerance;Cardiopulmonary status limiting activity;Decreased balance;Decreased coordination;Impaired perceived functional ability Rehab Potential: Good PT Frequency: Min 2X/week PT Duration: 4 weeks PT Treatment/Interventions: DME instruction;Gait training;Functional mobility training;Balance training;Neuromuscular re-education;Patient/family education PT Plan: continue balance, adding UE use left hand to assist with return of function for L UE     Goals Home Exercise Program Pt/caregiver will Perform Home Exercise  Program: Independently;For improved balance PT Goal: Perform Home Exercise Program - Progress: Progressing toward goal PT Short Term Goals PT Short Term Goal 1: Pt will improve is proprioceptive awareness and demonstrate tandem stance on soild surface x30 seconds on BLE to decrease risk of falls.  PT Short Term Goal 1 - Progress: Progressing toward goal PT Short Term Goal 2: Pt will improve his dymanic balance in order to ambulate independently outdoors without cuening for proper posture in order to progress towards hunting and fishing activities.  PT Short Term Goal 2 - Progress: Progressing toward goal PT Long Term Goals PT Long Term Goal 1: Pt will improve FOTO  status to greater than 60% for improved percieved functional ability.  PT Long Term Goal 2: Pt will improve Berg balance score to greater than 52/56 for improved safely in the community to decrease risk of falls.      PT - End of Session Activity Tolerance: Patient tolerated treatment well General Behavior During Therapy: WFL for tasks assessed/performed PT Plan of Care PT Home Exercise Plan: Not Given Consulted and Agree with Plan of Care: Patient  GP    Schneur Crowson 12/07/2013, 11:00 AM

## 2013-12-07 NOTE — Progress Notes (Signed)
Speech Language Pathology Treatment Patient Details  Name: Joshua Horton MRN: [id number] Date of Birth: 05-15-1946  Today's Date: 12/07/2013 Time: 0930-1005 SLP Time Calculation (min): 35 min  Authorization: [facility] Medicare  Authorization Time Period: 12/03/2013-12/31/2013  Authorization Visit#:  2 of 5   Treatment  Diet tolerance/management Oral motor exercises Oral reading tasks Intelligibility analysis Phonemic cues  Exaggeration of speech  SLP Goals  Home Exercise SLP Goal: Patient will Perform Home Exercise Program: Independently SLP Short Term Goals SLP Short Term Goal 1: Pt will complete oral motor exercises 2x/daily as part of home program with written reminders as needed. SLP Short Term Goal 1 - Progress: Progressing toward goal SLP Short Term Goal 2: Pt will demonstrate safe and efficient consumption of mechanical soft texures and thin liquids with min cues for compensatory strategies.  SLP Short Term Goal 2 - Progress: Progressing toward goal SLP Short Term Goal 3: Pt will implement speech intelligibility strategies during structured sentence-level oral reading tasks with 90% acc as judged by clinician. SLP Short Term Goal 3 - Progress: Progressing toward goal SLP Short Term Goal 4: Pt will increase conversation-level speech intelligibility to >90% with min cues for strategies. SLP Short Term Goal 4 - Progress: Progressing toward goal SLP Long Term Goals SLP Long Term Goal 1: Same as short term  Assessment/Plan  Patient Active Problem List   Diagnosis Date Noted  . Dysarthria due to cerebrovascular accident 12/03/2013  . Difficulty in walking(719.7) 12/03/2013  . Hip pain, bilateral--chronic 11/22/2013  . CVA (cerebral infarction) 11/21/2013  . Embolic cerebral infarction   . Unspecified cerebral artery occlusion with cerebral infarction 11/16/2013  . COPD (chronic obstructive pulmonary disease) 08/27/2013  . CAD S/P percutaneous coronary angioplasty 07/26/2013   . HTN (hypertension) 07/26/2013  . Hyperlipemia 07/26/2013  . History of tobacco use in home 07/26/2013  . Atherosclerosis of native arteries of the extremities with intermittent claudication 08/23/2012   SLP - End of Session Activity Tolerance: Patient tolerated treatment well General Behavior During Therapy: [facility] for tasks assessed/performed  SLP Assessment/Plan Clinical Impression Statement: Pt seen today in office for STx to address oral motor, swallowing, and speech intelligibility goals. Pt reported that he is completing oral motor ex 2x/day at home, and he was able to show SLP his ex program. He completed 10 repetitions of each ex, revealing some decrease in ROM as he fatigued. He reported that he continues to cut up meats at home, but is having no difficulty swallowing solids or liquids. Given oral reading tasks at the sentence level, pt was ~75% intelligible. He continues to present more difficulty with multisyllabic words. He did respond well to cues to "pronounce every part of every word," and he carried out this throughout the rest of the session. SLP recorded pt during oral reading and let him rate his own intelligibility. He stated that he would give himself an 8 or 9 compared to his PLOF. He stated that he "was never able to speak that good." SLP provided handout for strategies to increase intelligibility, and reviewed this with pt. SLP asked that pt try these strategies at home in conversation and while reading the newspaper. Pt receptive. Pt presents excellent effort during tx session. Cont with POC.  Speech Therapy Frequency: min 2x/week Duration: 2 weeks Treatment/Interventions: Diet toleration management by SLP;SLP instruction and feedback;Compensatory strategies;Patient/family education;Oral motor exercises;Compensatory techniques;Trials of upgraded texture/liquids;Cueing hierarchy Potential to Achieve Goals: Good  GN Functional Assessment Tool Used: clinical  judgement Functional Limitations: Motor speech Motor  Speech Current Status [phone]): At least 20 percent but less than 40 percent impaired, limited or restricted Motor Speech Goal Status [phone]): At least 1 percent but less than 20 percent impaired, limited or restricted  Rabia Argote S 12/07/2013, 10:21 AM

## 2013-12-11 ENCOUNTER — Ambulatory Visit (HOSPITAL_COMMUNITY)
Admission: RE | Admit: 2013-12-11 | Discharge: 2013-12-11 | Disposition: A | Discharge status: Home / Self Care | Payer: Medicare Other | Source: Ambulatory Visit | Attending: Family Medicine | Admitting: Family Medicine

## 2013-12-11 DIAGNOSIS — IMO0002 Reserved for concepts with insufficient information to code with codable children: Secondary | ICD-10-CM

## 2013-12-11 NOTE — Progress Notes (Signed)
Physical Therapy Treatment Patient Details  Name: Joshua Horton MRN: [id number] Date of Birth: 25-Feb-1946  Today's Date: 12/11/2013 Time: 9767-3419 PT Time Calculation (min): 42 min Visit#: 3 of 8  Re-eval: 01/02/14 Authorization: [employer] Medicare  Authorization Visit#: 3 of 10  Charges:  therex 3790-2409 (12'), NMR 1447-1515 (28')  Subjective: Symptoms/Limitations Symptoms: Pt states he is tired from speech and occupational therapies.  Pt states he is doing his HEP.   Exercise/Treatments Balance Exercises Standing Tandem Gait: 2 reps;Limitations Tandem Gait Limitations: balance beam Retro Gait: 2 reps;Limitations Retro Gait Limitations: balance beam Sidestepping: 2 reps;Limitations Sidestepping Limitations: balance beam Other Standing Exercises: walking carrying 2 dyna disks 2 laps in gym, walking carrying 3 lb weight 2 laps in gym  Other Standing Exercises: forward lunge: Lt with Lt UE flexion, Rt with Rt UE flexion 2 x 10each   Seated Other Seated Exercises: nu step 10 min UE/LE      Physical Therapy Assessment and Plan PT Assessment and Plan Clinical Impression Statement: Pt with noted fatigue in Lt UE with tasks as just had OT.  Pt. able to complete all activities today with 3 short rest breaks due to fatigue.  PRogressed balance activities on balance beam with frequent LOB, min assist to correct.   PT Plan: continue balance, adding UE use left hand to assist with return of function for L UE      Problem List Patient Active Problem List   Diagnosis Date Noted  . Dysarthria due to cerebrovascular accident 12/03/2013  . Difficulty in walking(719.7) 12/03/2013  . Hip pain, bilateral--chronic 11/22/2013  . CVA (cerebral infarction) 11/21/2013  . Embolic cerebral infarction   . Unspecified cerebral artery occlusion with cerebral infarction 11/16/2013  . COPD (chronic obstructive pulmonary disease) 08/27/2013  . CAD S/P percutaneous coronary angioplasty 07/26/2013  .  HTN (hypertension) 07/26/2013  . Hyperlipemia 07/26/2013  . History of tobacco use in home 07/26/2013  . Atherosclerosis of native arteries of the extremities with intermittent claudication 08/23/2012    PT - End of Session Activity Tolerance: Patient tolerated treatment well General Behavior During Therapy: [facility] for tasks assessed/performed   Teena Irani, PTA/CLT 12/11/2013, 3:46 PM

## 2013-12-11 NOTE — Progress Notes (Signed)
Speech Language Pathology Treatment Patient Details  Name: Joshua Horton MRN: [id number] Date of Birth: 02/12/1946  Today's Date: 12/11/2013 Time: 1300-1345 SLP Time Calculation (min): 45 min  Authorization: [facility] Medicare  Authorization Time Period: 12/03/2013-12/31/2013  Authorization Visit#:  3 of 5   HPI:  Symptoms/Limitations Symptoms: slurred speech    Treatment  Dysarthria Therapy Dysphagia Therapy Oral Motor Exercises Patient/Family Education Home Exercise Program  SLP Goals  Home Exercise SLP Goal: Patient will Perform Home Exercise Program: Independently SLP Goal: Perform Home Exercise Program - Progress: Progressing toward goal SLP Short Term Goals SLP Short Term Goal 1: Pt will complete oral motor exercises 2x/daily as part of home program with written reminders as needed. SLP Short Term Goal 1 - Progress: Progressing toward goal SLP Short Term Goal 2: Pt will demonstrate safe and efficient consumption of mechanical soft texures and thin liquids with min cues for compensatory strategies.  SLP Short Term Goal 2 - Progress: Progressing toward goal SLP Short Term Goal 3: Pt will implement speech intelligibility strategies during structured sentence-level oral reading tasks with 90% acc as judged by clinician. SLP Short Term Goal 3 - Progress: Progressing toward goal SLP Short Term Goal 4: Pt will increase conversation-level speech intelligibility to >90% with min cues for strategies. SLP Short Term Goal 4 - Progress: Progressing toward goal SLP Long Term Goals SLP Long Term Goal 1: Same as short term  Assessment/Plan  Patient Active Problem List   Diagnosis Date Noted  . Dysarthria due to cerebrovascular accident 12/03/2013  . Difficulty in walking(719.7) 12/03/2013  . Hip pain, bilateral--chronic 11/22/2013  . CVA (cerebral infarction) 11/21/2013  . Embolic cerebral infarction   . Unspecified cerebral artery occlusion with cerebral infarction 11/16/2013  .  COPD (chronic obstructive pulmonary disease) 08/27/2013  . CAD S/P percutaneous coronary angioplasty 07/26/2013  . HTN (hypertension) 07/26/2013  . Hyperlipemia 07/26/2013  . History of tobacco use in home 07/26/2013  . Atherosclerosis of native arteries of the extremities with intermittent claudication 08/23/2012   SLP - End of Session Activity Tolerance: Patient tolerated treatment well General Behavior During Therapy: [facility] for tasks assessed/performed  SLP Assessment/Plan Clinical Impression Statement: Pt reported that he continues to complete OMEs at home. He primarily consumes mechanical soft textures, but did admit to trying some steak. He had a difficult time masticating and eventually expectorated it. SLP suggested that he try braised and slow cooked meats for now. Pt was again tape recorded reading sentences, once without cues for strategies and once with cues for strategies (open mouth). Both the pt and SLP were able to discern improved intelligibility between the two readings. SLP also spoke with pt on the telephone in another room and speech intelligibility was judged to be ~80% intelligible with some requests for clarification. SLP suggested that pt only focus on strategy of over-articulation (Open your mouth) because when he does this, he automatically decreases his rate. Pt complains of excess saliva in oral cavity and was encouraged to swallow more frequently to clear pooling secretions as he likely does not swallow as often as pre-stroke due to increased effort and reduced sensation. Continue plan of care. Speech Therapy Frequency: min 2x/week Duration: 2 weeks Treatment/Interventions: Diet toleration management by SLP;SLP instruction and feedback;Compensatory strategies;Patient/family education;Oral motor exercises;Compensatory techniques;Trials of upgraded texture/liquids;Cueing hierarchy Potential to Achieve Goals: Good    Thank you,  Genene Churn,  [place] 12/11/2013, 5:20 PM

## 2013-12-12 ENCOUNTER — Telehealth (HOSPITAL_COMMUNITY): Payer: Self-pay

## 2013-12-13 ENCOUNTER — Ambulatory Visit (HOSPITAL_COMMUNITY)
Admission: RE | Admit: 2013-12-13 | Discharge: 2013-12-13 | Disposition: A | Discharge status: Home / Self Care | Payer: Medicare Other | Source: Ambulatory Visit | Attending: Family Medicine | Admitting: Family Medicine

## 2013-12-13 DIAGNOSIS — I639 Cerebral infarction, unspecified: Secondary | ICD-10-CM

## 2013-12-13 DIAGNOSIS — IMO0002 Reserved for concepts with insufficient information to code with codable children: Secondary | ICD-10-CM

## 2013-12-13 DIAGNOSIS — R262 Difficulty in walking, not elsewhere classified: Secondary | ICD-10-CM

## 2013-12-13 NOTE — Evaluation (Signed)
Occupational Therapy Evaluation  Patient Details  Name: Joshua Horton MRN: [id number] Date of Birth: 05/12/1946  Today's Date: 12/03/2013 Time: 0205-0230 OT Time Calculation (min): 25 min Evaluation 0205-0230 (92')  Visit#: 1 of 24  Re-eval: 12/31/13     Authorization: [facility] Medicare  Authorization Time Period: before 10th visit  Authorization Visit#: 1 of 10   Past Medical History:  Past Medical History  Diagnosis Date  . Coronary artery disease   . Wheezing   . Chronic cough   . Peripheral vascular disease   . Hypertension   . Hyperlipidemia   . Reflux   . COPD (chronic obstructive pulmonary disease)   . Myocardial infarction   . CHF (congestive heart failure)   . Embolic cerebral infarction     S/P IV t-PA,  post-tPA hemorrhagic transformation,clinically asymptomatic, scattered right MCA ischemic infarcts, right occipital lobe hemorrhagic infarction with focal hematoma, small volume of right subarachnoid hemorrhage    Past Surgical History:  Past Surgical History  Procedure Laterality Date  . Coronary angioplasty with stent placement  2003  . Cervical disc surgery      Dr Vertell Limber  . Tee without cardioversion N/A 11/19/2013    Procedure: TRANSESOPHAGEAL ECHOCARDIOGRAM (TEE);  Surgeon: Joshua Hector, MD;  Location: [facility] ENDOSCOPY;  Service: Cardiovascular;  Laterality: N/A;  . Loop recorder implant  11/19/2013    MDT LinQ implanted by Dr Joshua Horton for cryptogenic stroke    Subjective Symptoms/Limitations Symptoms: S:  I need to get this hand working again Pertinent History:  Joshua Horton is a 68 y.o. Left handed male with history of CAD, HTN, COPD; who was admitted on 11/16/13 with acute onset of speech difficulty and left sided weakness. Patient reported similar transient symptoms on 11/03/13. CT head without acute changes. Treated with tPA. MRI/MRA brain with scattered acute right MCA infarcts, most confluent at the posterior insula superimposed rigth occipital lobe  hemorrhagic infarct with focal hematoma 30 X 19 X 13 mm, trace SAH over posterior right hemisphere, R MCA M2 segment occlusion/stenosis and suspicion of plaque or thrombus supraclinoid R- ICA causing mild to moderate stenosis. Carotid dopplers with 40-59% R--ICA stenosis and 1-39% L-ICA stenosis. 2D echo with EF 65-70% with grade 1 diastolic dysfunction and no wall abnormality. 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 Pt attedned CIR from 11/21/13-11/29/13 when he was d/c to his home. He is now doing well at home  using his cane for "safety" outdoors and most difficulty with left hand/upper extremity Patient Stated Goals: to get dominant hand working  Pain Assessment Currently in Pain?: No/denies  Precautions/Restrictions  Precautions Precautions: Fall Precaution Comments: risk for subluxation and injury to left hand due to decr ROM and sensation  Balance Screening Balance Screen Has the patient fallen in the past 6 months: No Has the patient had a decrease in activity level because of a fear of falling? : No Is the patient reluctant to leave their home because of a fear of falling? : No  Prior Functioning     Assessment     12/03/13 1600  Precautions  Precautions Fall  Precaution Comments risk for subluxation and injury to left hand due to decr ROM and sensation  Balance Screen  Has the patient fallen in the past 6 months No  Has the patient had a decrease in activity level because of a fear of falling?  No  Is the patient reluctant to leave their home because of a  fear of falling?  No  Home Living  Living Arrangements Spouse/significant other  Type of [place] to enter  Entrance Stairs-Number of Steps 2  Entrance Stairs-Rails Right;Left  Home Layout Two level  Alternate Level Stairs-Number of Steps (can live on main floor)  Home Equipment Shower seat;Cane - single point;Toilet riser;Grab bars - toilet;Grab bars  - tub/shower;Hand held shower head  Lives With Spouse  Prior Function  Level of Independence Independent with basic ADLs;Independent with homemaking with ambulation  Driving Yes  Vocation Retired  Comments Enjoys hunting and fishing  ADL  ADL Comments difficulty with buttoning pants, small button shirt (utilizing right hand only for most buttons), zipping jackets, tying shoes, pulling pants, shaving  Vision - History  Baseline Vision Wears glasses only for reading  Cognition  Overall Cognitive Status Within Functional Limits for tasks assessed  Arousal/Alertness Awake/alert  Orientation Level Oriented X4  Memory Appears intact  Awareness Appears intact  Problem Solving Appears intact  Safety/Judgment Appears intact  Sensation  Light Touch Appears Intact  Coordination  Gross Motor Movements are Fluid and Coordinated No  Fine Motor Movements are Fluid and Coordinated No  Coordination and Movement Description gross grasp right hand difficulty with release  9 Hole Peg Test not tested this date as patient unable to oppose with Lt hand  RUE Assessment  RUE Assessment WFL  RUE Strength  Grip (lbs) 75  Lateral Pinch 30 lbs  LUE Assessment  LUE Assessment X  LUE AROM (degrees)  Left Composite Finger Flexion 50%  Left Thumb Opposition Digit 2 (lateral pinch only )  LUE PROM (degrees)  LUE Overall PROM Comments WFL  LUE Strength  Gross Grasp Functional (once item placed in hand as pt with limited digit extension)  Grip (lbs) 10  Lateral Pinch 0 lbs  LUE Overall Strength Comments grossly 3+/5 proximal strength (shoulder, elbow); 3/5 wrist  Palpation  Palpation min flexor tone in left hand  Written Expression  Dominant Hand Left   Occupational Therapy Assessment and Plan OT Assessment and Plan Clinical Impression Statement: A:  A:  Patient presents with LUE weakness s/p recent CVA. Patient received Inpatient rehab at [facility] and was recently discharged.  Patient will benefit  from skilled OT intervention to improve LUE AROM, strength, GMC, FMC, and use of LUE as dominant with ADLs.    Pt will benefit from skilled therapeutic intervention in order to improve on the following deficits: Decreased strength;Impaired tone;Decreased coordination;Decreased range of motion;Impaired UE functional use;Decreased activity tolerance;Impaired sensation Rehab Potential: Good OT Duration: 8 weeks OT Treatment/Interventions: Self-care/ADL training;Therapeutic activities;Therapeutic exercise;Manual therapy;Neuromuscular education;Modalities;Splinting;Patient/family education OT Plan: P: Patient will benefit from skilled OT intervention to improve LUE AROM, strength, GMC, FMC. Treatment Plan: strengthening LUE;  Seated GMC, FMC, grip strengthening exercises. resting hand splint for Left hand as needed,  weightbearing activities, and ADL re-training..    Goals Short Term Goals Time to Complete Short Term Goals: 4 weeks Short Term Goal 1: Patient will be educated on HEP Short Term Goal 2: Patient will increase to use of left hand as an active assist with all daily activities 25% of the time. Short Term Goal 3: Patient will tolerate weightbearing on LUE for 3 minute for increased independence with BADL.  Short Term Goal 4: Patient will demonstrate gross grasp/digit flexion >/= 75% and extension of at least 25% to increase ability to pull pants Short Term Goal 5: Patient will demo opposition to at least digit 2 to  increase ability to utilize L hand for clothing fasteners Additional Short Term Goals?: Yes Short Term Goal 6: Patient will complete the Nine Hole Peg Test.  Short Term Goal 7: Patient will  increase left grip strength to 15 pounds and pinch strength to 2 pounds for increased ability to open containers when cooking.  Long Term Goals Time to Complete Long Term Goals: 8 weeks Long Term Goal 1: Patient will return to highest level of independence with all leisure and daily activities.   Long Term Goal 2: Patient will  increase left grip strength to >/=35 pounds and pinch strength to 8 pounds for increased ability to open containers Long Term Goal 3: Patient will complete the Nine Hole Peg Test in standardized fashion, and oppose all digits for greater [facility] needed to fasten buttons.  Long Term Goal 4: Patient will have at least 4+/5 strength in proximal LUE for increased ability to pick up tackle box  Long Term Goal 5: Patient will report independent ability to shave with Lt hand   Problem List Patient Active Problem List   Diagnosis Date Noted  . Dysarthria due to cerebrovascular accident 12/03/2013  . Difficulty in walking(719.7) 12/03/2013  . Lack of coordination 12/03/2013  . Hip pain, bilateral--chronic 11/22/2013  . CVA (cerebral infarction) 11/21/2013  . Embolic cerebral infarction   . Unspecified cerebral artery occlusion with cerebral infarction 11/16/2013  . COPD (chronic obstructive pulmonary disease) 08/27/2013  . CAD S/P percutaneous coronary angioplasty 07/26/2013  . HTN (hypertension) 07/26/2013  . Hyperlipemia 07/26/2013  . History of tobacco use in home 07/26/2013  . Atherosclerosis of native arteries of the extremities with intermittent claudication 08/23/2012    End of Session Activity Tolerance: Patient tolerated treatment well General Behavior During Therapy: [facility] for tasks assessed/performed OT Plan of Care OT Home Exercise Plan: grasping, SROM/AAROM for home OT Patient Instructions: return demo, verbalize understanding Consulted and Agree with Plan of Care: Patient  GO Functional Assessment Tool Used: clinical observation, grip R/L 75/10=13%, 9 hole peg test=unable Functional Limitation: Carrying, moving and handling objects Carrying, Moving and Handling Objects Current Status ([id number]): At least 60 percent but less than 80 percent impaired, limited or restricted Carrying, Moving and Handling Objects Goal Status [phone]([id number]): At least 1 percent but  less than 20 percent impaired, limited or restricted   12/03/2013, 5:46 PM  Physician Documentation Your signature is required to indicate approval of the treatment plan as stated above.  Please sign and either send electronically or make a copy of this report for your files and return this physician signed original.  Please mark one 1.__approve of plan  2. ___approve of plan with the following conditions.   ______________________________                                                          _____________________ Physician Signature  Date  

## 2013-12-13 NOTE — Progress Notes (Signed)
Physical Therapy Treatment Patient Details  Name: Joshua Horton MRN: [id number] Date of Birth: 11-Oct-1946  Today's Date: 12/13/2013 Time: 3845-3646 PT Time Calculation (min): 23 min Charge TE 8032-1224, NMR [id number]  Visit#: 4 of 8  Re-eval: 01/02/14    Authorization: [facility] Medicare  Authorization Time Period:    Authorization Visit#: 4 of 10   Subjective: Symptoms/Limitations Symptoms: Pt reports complaince with HEP, stated he is tired from speech and occupational therapies.   Pain Assessment Currently in Pain?: No/denies  Objective:  Exercise/Treatments Balance Exercises Standing Tandem Gait: 2 reps;Limitations Tandem Gait Limitations: balance beam Retro Gait: 2 reps;Limitations Retro Gait Limitations: balance beam Sidestepping: 2 reps;Limitations Sidestepping Limitations: balance beam Cone Rotation: Foam;Left turn;Limitations Cone Rotation Limitations: Lt UE rotation  Other Standing Exercises: 3# into cabinet from counter, 3# bicep curls 10x Other Standing Exercises: forward lunge walking with opposite UE flexion with opposite LE 2RT to encourage proper gait mechanics with UE movement  Seated Other Seated Exercises: nu step 10 min UE/LE hill level #3, resistance 3 SPM average >100  Supine Other Supine Exercises: Quadruped UE lift 5x, LE      Physical Therapy Assessment and Plan PT Assessment and Plan Clinical Impression Statement: Session focus on increasing Lt UE use with functional tasks to improve gait mechanics and high level balance activities.  Began quadruped exercises to increase weight bearing to Lt UE. PT Plan: continue balance, adding UE use left hand to assist with return of function for L UE     Goals Home Exercise Program Pt/caregiver will Perform Home Exercise Program: Independently;For improved balance PT Short Term Goals PT Short Term Goal 1: Pt will improve is proprioceptive awareness and demonstrate tandem stance on soild surface x30 seconds  on BLE to decrease risk of falls.  PT Short Term Goal 1 - Progress: Progressing toward goal PT Short Term Goal 2: Pt will improve his dymanic balance in order to ambulate independently outdoors without cuening for proper posture in order to progress towards hunting and fishing activities.  PT Short Term Goal 2 - Progress: Progressing toward goal PT Long Term Goals PT Long Term Goal 1: Pt will improve FOTO  status to greater than 60% for improved percieved functional ability.  PT Long Term Goal 2: Pt will improve Berg balance score to greater than 52/56 for improved safely in the community to decrease risk of falls.   Problem List Patient Active Problem List   Diagnosis Date Noted  . Dysarthria due to cerebrovascular accident 12/03/2013  . Difficulty in walking(719.7) 12/03/2013  . Lack of coordination 12/03/2013  . Hip pain, bilateral--chronic 11/22/2013  . CVA (cerebral infarction) 11/21/2013  . Embolic cerebral infarction   . Unspecified cerebral artery occlusion with cerebral infarction 11/16/2013  . COPD (chronic obstructive pulmonary disease) 08/27/2013  . CAD S/P percutaneous coronary angioplasty 07/26/2013  . HTN (hypertension) 07/26/2013  . Hyperlipemia 07/26/2013  . History of tobacco use in home 07/26/2013  . Atherosclerosis of native arteries of the extremities with intermittent claudication 08/23/2012    PT - End of Session Equipment Utilized During Treatment: Gait belt Activity Tolerance: Patient tolerated treatment well Horton Behavior During Therapy: [facility] for tasks assessed/performed  GP    Aldona Lento 12/13/2013, 4:14 PM

## 2013-12-13 NOTE — Progress Notes (Signed)
Occupational Therapy Treatment Patient Details  Name: Joshua Horton MRN: [id number] Date of Birth: [birth year]  Today's Date: 12/13/2013 Time: 1324-4010 OT Time Calculation (min): 40 min NeuroMuscular RE-Education 1355-1425 (30';) TherExercises 2725-3664 (10')   Visit#: 3 of 24  Re-eval: 12/31/13    Authorization: [facility] Medicare  Authorization Time Period: before 10th visit  Authorization Visit#: 3 of    Subjective Symptoms/Limitations Symptoms: S:  My finger are getting straighter.  Limitations: 12/13/2013 - spoke with Melton Alar, RN ( device nurse) who reports/clears patient for electrical stimulation on distal LUE as long as at least 6" away from chest.   Pain Assessment Currently in Pain?: No/denies   Exercise/Treatments    Standing Exercises Upper Extremity D1: 15 reps Upper Extremity D2: 15 reps  Wrist Exercises Forearm Supination: AAROM;10 reps;Seated;Squeeze ball;5 reps     Neurological Re-education Exercises Forearm Supination: AAROM;10 reps;Seated;Squeeze ball;5 reps  Weight Bearing Exercises Weight Bearing Position: Seated Seated with weight on hand: table top for increased digit extension alternating wiht grasp and realease activities   Development of Reach  Development of Reach: Reaching;Saebo Saebo Crate; Right: x 4 rings x 3 sets on table top   Grasp and Release Grasp and Release:  (gross grasp on saebo tall pegsx3, short x2 and rings x4)   Occupational Therapy Assessment and Plan OT Assessment and Plan Clinical Impression Statement: A:  Patient presents this date with increased abillity to extend digits  with wrist extension.  Patient cont with increaesed difficulty grasping saebo balls. able to place hand on however as patient initiates grasp, ball slides out of hand as he does not have enough thumb flexion strength to complete full grasp .  Patient with noted difficulty to maintain grasp on saebo poles with LUE extended to place in crate  however demo improved a bility to place rings with increased complaint of fatigue following  treatment.   OT Plan: P:  E-stim  to distal LUE. cont grasp and release.    Goals Home Exercise Program Pt/caregiver will Perform Home Exercise Program: Independently;For improved balance Short Term Goals Short Term Goal 1: Patient will be educated on HEP Short Term Goal 1 Progress: Progressing toward goal Short Term Goal 2: Patient will increase to use of left hand as an active assist with all daily activities 25% of the time. Short Term Goal 2 Progress: Progressing toward goal Short Term Goal 3: Patient will tolerate weightbearing on LUE for 3 minute for increased independence with BADL.  Short Term Goal 3 Progress: Progressing toward goal Short Term Goal 4: Patient will demonstrate gross grasp/digit flexion >/= 75% and extension of at least 25% to increase ability to pull pants Short Term Goal 4 Progress: Progressing toward goal Short Term Goal 5: Patient will demo opposition to at least digit 2 to increase ability to utilize L hand for clothing fasteners Short Term Goal 5 Progress: Progressing toward goal Additional Short Term Goals?: Yes Short Term Goal 6: Patient will complete the Nine Hole Peg Test.  Short Term Goal 6 Progress: Progressing toward goal Short Term Goal 7: Patient will  increase left grip strength to 15 pounds and pinch strength to 2 pounds for increased ability to open containers  Short Term Goal 7 Progress: Progressing toward goal Long Term Goals Long Term Goal 1: Patient will return to highest level of independence with all leisure and daily activities.  Long Term Goal 1 Progress: Progressing toward goal Long Term Goal 2: Patient will  increase left grip strength to >/=  35 pounds and pinch strength to 8 pounds for increased ability to open containers Long Term Goal 2 Progress: Progressing toward goal Long Term Goal 3: Patient will complete the Nine Hole Peg Test in  standardized fashion, and oppose all digits for greater [facility] needed to fasten buttons.  Long Term Goal 3 Progress: Progressing toward goal Long Term Goal 4: Patient will have at least 4+/5 strength in proximal LUE for increased ability to pick up tackle box  Long Term Goal 4 Progress: Progressing toward goal Long Term Goal 5: Patient will report independent ability to shave with Lt hand  Long Term Goal 5 Progress: Progressing toward goal  Problem List Patient Active Problem List   Diagnosis Date Noted  . Dysarthria due to cerebrovascular accident 12/03/2013  . Difficulty in walking(719.7) 12/03/2013  . Lack of coordination 12/03/2013  . Hip pain, bilateral--chronic 11/22/2013  . CVA (cerebral infarction) 11/21/2013  . Embolic cerebral infarction   . Unspecified cerebral artery occlusion with cerebral infarction 11/16/2013  . COPD (chronic obstructive pulmonary disease) 08/27/2013  . CAD S/P percutaneous coronary angioplasty 07/26/2013  . HTN (hypertension) 07/26/2013  . Hyperlipemia 07/26/2013  . History of tobacco use in home 07/26/2013  . Atherosclerosis of native arteries of the extremities with intermittent claudication 08/23/2012    End of Session Activity Tolerance: Patient tolerated treatment well General Behavior During Therapy: [facility] for tasks assessed/performed  GO    Donney Rankins, OTR/L  12/13/2013, 4:38 PM

## 2013-12-13 NOTE — Progress Notes (Signed)
Speech Language Pathology Treatment Patient Details  Name: Joshua Horton MRN: [id number] Date of Birth: 1946-01-26  Today's Date: 12/13/2013 Time: 1302-1345 SLP Time Calculation (min): 43 min  Authorization: [facility] Medicare  Authorization Time Period: 12/03/2013-12/31/2013  Authorization Visit#:  4 of 4   HPI:  Symptoms/Limitations Symptoms: slurred speech Pain Assessment Currently in Pain?: No/denies  Treatment  Dysarthria Therapy Dysphagia Therapy Oral Motor Exercises Patient/Family Education Home Exercise Program  SLP Goals  Home Exercise SLP Goal: Patient will Perform Home Exercise Program: Independently SLP Goal: Perform Home Exercise Program - Progress: Met SLP Short Term Goals SLP Short Term Goal 1: Pt will complete oral motor exercises 2x/daily as part of home program with written reminders as needed. SLP Short Term Goal 1 - Progress: Met SLP Short Term Goal 2: Pt will demonstrate safe and efficient consumption of mechanical soft texures and thin liquids with min cues for compensatory strategies.  SLP Short Term Goal 2 - Progress: Met SLP Short Term Goal 3: Pt will implement speech intelligibility strategies during structured sentence-level oral reading tasks with 90% acc as judged by clinician. SLP Short Term Goal 3 - Progress: Met SLP Short Term Goal 4: Pt will increase conversation-level speech intelligibility to >90% with min cues for strategies. SLP Short Term Goal 4 - Progress: Partly met SLP Long Term Goals SLP Long Term Goal 1: Same as short term  Assessment/Plan  Patient Active Problem List   Diagnosis Date Noted  . Dysarthria due to cerebrovascular accident 12/03/2013  . Difficulty in walking(719.7) 12/03/2013  . Lack of coordination 12/03/2013  . Hip pain, bilateral--chronic 11/22/2013  . CVA (cerebral infarction) 11/21/2013  . Embolic cerebral infarction   . Unspecified cerebral artery occlusion with cerebral infarction 11/16/2013  . COPD (chronic  obstructive pulmonary disease) 08/27/2013  . CAD S/P percutaneous coronary angioplasty 07/26/2013  . HTN (hypertension) 07/26/2013  . Hyperlipemia 07/26/2013  . History of tobacco use in home 07/26/2013  . Atherosclerosis of native arteries of the extremities with intermittent claudication 08/23/2012   SLP - End of Session Activity Tolerance: Patient tolerated treatment well General Behavior During Therapy: WFL for tasks assessed/performed  SLP Assessment/Plan Clinical Impression Statement: Pt is independent with oral motor exercises at home. Pt and wife report that pt's speech is back to baseline. Intelligibility is negatively impacted by poor dentition (and premorbid articulation skills). Overall intelligibility is close to 100% in structured tasks when pt uses over articulation strategy. He has more difficulty remembering to implement strategies in spontaneousl conversation. Pt consumed regular textures and thin liquids during session. Will upgrade to regular textures  with cut up meat due to dentition. No further SLP services indicated at this time as pt met all goals. Pt and wife in agreement with plan. Treatment/Interventions: Diet toleration management by SLP;SLP instruction and feedback;Compensatory strategies;Patient/family education;Oral motor exercises;Compensatory techniques;Trials of upgraded texture/liquids;Cueing hierarchy Potential to Achieve Goals: Good  GN Functional Assessment Tool Used: clinical judgement Functional Limitations: Motor speech Motor Speech Current Status [phone]): At least 1 percent but less than 20 percent impaired, limited or restricted Motor Speech Goal Status [phone]): At least 1 percent but less than 20 percent impaired, limited or restricted Motor Speech Goal Status [phone]): At least 1 percent but less than 20 percent impaired, limited or restricted  Joshua Horton 12/13/2013, 2:11 PM  Physician  Treatment Plan  Your signature is required to indicate approval of the treatment plan/progress as stated above. Please make a copy of this report for your files and return this physician signed original in the self-addressed envelope or fax to [phone]. COMMENTS/CHANGES:__________________________________________________________________________________________________________________________   ____________________________________                      ____________________ Joshua Horton                                                    DATE

## 2013-12-18 ENCOUNTER — Ambulatory Visit (HOSPITAL_COMMUNITY)
Admission: RE | Admit: 2013-12-18 | Discharge: 2013-12-18 | Disposition: A | Discharge status: Home / Self Care | Payer: Medicare Other | Source: Ambulatory Visit | Attending: Family Medicine | Admitting: Family Medicine

## 2013-12-18 ENCOUNTER — Telehealth: Payer: Self-pay | Admitting: Internal Medicine

## 2013-12-18 ENCOUNTER — Ambulatory Visit (HOSPITAL_COMMUNITY): Payer: Medicare Other | Admitting: Speech Pathology

## 2013-12-18 DIAGNOSIS — R279 Unspecified lack of coordination: Secondary | ICD-10-CM | POA: Insufficient documentation

## 2013-12-18 DIAGNOSIS — M6281 Muscle weakness (generalized): Secondary | ICD-10-CM | POA: Insufficient documentation

## 2013-12-18 DIAGNOSIS — J449 Chronic obstructive pulmonary disease, unspecified: Secondary | ICD-10-CM | POA: Insufficient documentation

## 2013-12-18 DIAGNOSIS — I69922 Dysarthria following unspecified cerebrovascular disease: Secondary | ICD-10-CM | POA: Insufficient documentation

## 2013-12-18 DIAGNOSIS — R269 Unspecified abnormalities of gait and mobility: Secondary | ICD-10-CM | POA: Insufficient documentation

## 2013-12-18 DIAGNOSIS — J4489 Other specified chronic obstructive pulmonary disease: Secondary | ICD-10-CM | POA: Insufficient documentation

## 2013-12-18 DIAGNOSIS — I69998 Other sequelae following unspecified cerebrovascular disease: Secondary | ICD-10-CM | POA: Insufficient documentation

## 2013-12-18 DIAGNOSIS — I10 Essential (primary) hypertension: Secondary | ICD-10-CM | POA: Insufficient documentation

## 2013-12-18 DIAGNOSIS — R29898 Other symptoms and signs involving the musculoskeletal system: Secondary | ICD-10-CM | POA: Insufficient documentation

## 2013-12-18 DIAGNOSIS — Z5189 Encounter for other specified aftercare: Secondary | ICD-10-CM | POA: Insufficient documentation

## 2013-12-18 NOTE — Telephone Encounter (Signed)
Spoke with Joshua Horton.  Instructed her to take the [employer] card with her and when she gets to the rehab ask them to call [employer] with the official procedure that is being done to determine if there is any interaction or conflict.  Joshua Horton verbalizes understanding and agreement.

## 2013-12-18 NOTE — Telephone Encounter (Signed)
Patient is having a procedure at 2:30 today for his left hand for electric shock.  They are worried if this will affect his loop recorded device that Dr Caryl Comes placed. Please contact patient as soon as possible

## 2013-12-18 NOTE — Telephone Encounter (Signed)
Pt was never given a wound check appt w/ device clinic. He was given an appt w/ [facility] for post ILR implant. I cancelled appt. I also enrolled & confirmed transmissions are being received from [place]. Pt knows monthly reports will be taken automatically from her home monitor. No other f/u will be scheduled unless home monitoring discovers something.   For the electrical hand procedure, pt will send a transmission before the procedure and again once home.

## 2013-12-18 NOTE — Telephone Encounter (Signed)
Follow up        Pt now has to record before the procedure and send it to [place] and record after the procedure to send also. Which recorder do they use? The handheld or transmitter? Please give pt a call.

## 2013-12-18 NOTE — Telephone Encounter (Signed)
Spoke with patient and his wife.  He is going to [facility] today and may have a procedure ("electric shock") to help stimulate sensation/movement in his left hand/fingers, s/p CVA.  This appt is at 2:30 today.  Spoke with Juanda Crumble in device clinic who is unsure if this would interfere with the loop recorder.  We are now waiting to speak with Dr. Caryl Comes. Informed patient's wife of this.  They are leaving to go to the appt at 2:05 today.

## 2013-12-20 ENCOUNTER — Ambulatory Visit (HOSPITAL_COMMUNITY): Payer: Medicare Other | Admitting: Speech Pathology

## 2013-12-20 ENCOUNTER — Ambulatory Visit (INDEPENDENT_AMBULATORY_CARE_PROVIDER_SITE_OTHER): Payer: Medicare Other | Admitting: *Deleted

## 2013-12-20 ENCOUNTER — Ambulatory Visit: Payer: Medicare Other

## 2013-12-20 ENCOUNTER — Ambulatory Visit (HOSPITAL_COMMUNITY)
Admission: RE | Admit: 2013-12-20 | Discharge: 2013-12-20 | Disposition: A | Discharge status: Home / Self Care | Payer: Medicare Other | Source: Ambulatory Visit | Attending: Family Medicine | Admitting: Family Medicine

## 2013-12-20 DIAGNOSIS — I6350 Cerebral infarction due to unspecified occlusion or stenosis of unspecified cerebral artery: Secondary | ICD-10-CM

## 2013-12-20 DIAGNOSIS — I639 Cerebral infarction, unspecified: Secondary | ICD-10-CM

## 2013-12-20 LAB — MDC_IDC_ENUM_SESS_TYPE_REMOTE
Date Time Interrogation Session: 20150205204230
MDC IDC SET ZONE DETECTION INTERVAL: 2000 ms
Zone Setting Detection Interval: 3000 ms
Zone Setting Detection Interval: 370 ms

## 2013-12-24 NOTE — Progress Notes (Signed)
Occupational Therapy Treatment Patient Details  Name: ISAHI GODWIN MRN: [id number] Date of Birth: [birth year]  Today's Date: 12/18/2013 Time: 5638-7564 OT Time Calculation (min): 42 min Therexercises 3329-5188 (15') NeuroMuscular 4166-0630 (50')   Visit#: 4 of 24  Re-eval: 12/31/13    Authorization: [employer] Medicare  Authorization Time Period: before 10th visit  Authorization Visit#: 4 of 1433  Subjective Symptoms/Limitations Symptoms: S:  I want to do what it takes but I dont want to mess anything up either Pain Assessment Currently in Pain?: No/denies      Exercise/Treatments  12/18/13 1400  Shoulder Exercises: Seated  Elevation AROM;10 reps  Flexion AROM;10 reps  Abduction AROM;10 reps    12/18/13 1400  Elbow Exercises  Elbow Flexion AROM;10 reps (2#wrist weight)  Elbow Extension AROM;10 reps (2#wrist weight)  Forearm Supination AROM;10 reps (2#wrist weight)  Forearm Pronation AROM;10 reps (2#wrist weight)  Wrist Flexion AAROM;10 reps  Wrist Extension AAROM;10 reps    12/18/13 1400  Neurological Re-education Exercises  Shoulder Flexion AROM;10 reps (to 90 degrees; 2#wrist weight)  Shoulder ABduction AROM;10 reps (to 90 degrees; .)  Elbow Flexion AROM;10 reps (2#wrist weight)  Elbow Extension AROM;10 reps (2#wrist weight)  Forearm Supination AROM;10 reps (2#wrist weight)  Forearm Pronation AROM;10 reps (2#wrist weight)  Wrist Flexion AAROM;10 reps  Wrist Extension AAROM;10 reps  Weight Bearing Exercises  Weight Bearing Position Seated  Seated with weight on hand table top for increased digit extension alternating with grasp and realease activities          Occupational Therapy Assessment and Plan OT Assessment and Plan Clinical Impression Statement: A:  Patietn demos improving proximal control and good tolerance for strengthening this date.  cont with limited wrist, hand movements.  spoke with multiple people this date regarding loop re corder  for clearance in order to perforn estim on distal LUE to increased movmement, innervation.  OT Plan: P: ??e-stim,  grasp and release, tapping for digit extension    Goals Short Term Goals Short Term Goal 1: Patient will be educated on HEP Short Term Goal 1 Progress: Progressing toward goal Short Term Goal 2: Patient will increase to use of left hand as an active assist with all daily activities 25% of the time. Short Term Goal 2 Progress: Progressing toward goal Short Term Goal 3: Patient will tolerate weightbearing on LUE for 3 minute for increased independence with BADL.  Short Term Goal 3 Progress: Progressing toward goal Short Term Goal 4: Patient will demonstrate gross grasp/digit flexion >/= 75% and extension of at least 25% to increase ability to pull pants Short Term Goal 4 Progress: Progressing toward goal Short Term Goal 5: Patient will demo opposition to at least digit 2 to increase ability to utilize L hand for clothing fasteners Short Term Goal 5 Progress: Progressing toward goal Short Term Goal 6: Patient will complete the Nine Hole Peg Test.  Short Term Goal 6 Progress: Progressing toward goal Short Term Goal 7: Patient will  increase left grip strength to 15 pounds and pinch strength to 2 pounds for increased ability to open containers  Short Term Goal 7 Progress: Progressing toward goal Long Term Goals Long Term Goal 1: Patient will return to highest level of independence with all leisure and daily activities.  Long Term Goal 1 Progress: Progressing toward goal Long Term Goal 2: Patient will  increase left grip strength to >/=35 pounds and pinch strength to 8 pounds for increased ability to open containers Long Term Goal 2 Progress: Progressing  toward goal Long Term Goal 3: Patient will complete the Nine Hole Peg Test in standardized fashion, and oppose all digits for greater [facility] needed to fasten buttons.  Long Term Goal 3 Progress: Progressing toward goal Long Term Goal 4:  Patient will have at least 4+/5 strength in proximal LUE for increased ability to pick up tackle box  Long Term Goal 4 Progress: Progressing toward goal Long Term Goal 5: Patient will report independent ability to shave with Lt hand  Long Term Goal 5 Progress: Progressing toward goal  Problem List Patient Active Problem List   Diagnosis Date Noted  . Dysarthria due to cerebrovascular accident 12/03/2013  . Difficulty in walking(719.7) 12/03/2013  . Lack of coordination 12/03/2013  . Hip pain, bilateral--chronic 11/22/2013  . CVA (cerebral infarction) 11/21/2013  . Embolic cerebral infarction   . Unspecified cerebral artery occlusion with cerebral infarction 11/16/2013  . COPD (chronic obstructive pulmonary disease) 08/27/2013  . CAD S/P percutaneous coronary angioplasty 07/26/2013  . HTN (hypertension) 07/26/2013  . Hyperlipemia 07/26/2013  . History of tobacco use in home 07/26/2013  . Atherosclerosis of native arteries of the extremities with intermittent claudication 08/23/2012    End of Session Activity Tolerance: Patient tolerated treatment well General Behavior During Therapy: [facility] for tasks assessed/performed  GO    Donney Rankins, OTR/L  12/18/2013, 4:17 PM

## 2013-12-24 NOTE — Progress Notes (Signed)
Occupational Therapy Treatment Patient Details  Name: Joshua Horton MRN: [id number] Date of Birth: 03-Oct-1946  Today's Date: 12/11/2013 Time: 6301-6010 OT Time Calculation (min): 42 min NeuroMuscular 1353-1411 (15') TherExercises 9323-5573 (80')  Visit#: 2 of 24  Re-eval: 12/31/13    Authorization: [employer]  Authorization Time Period: before 10th visit  Authorization Visit#: 2 of 10  Subjective Symptoms/Limitations Symptoms: S:  we have got to get this hand working Pain Assessment Currently in Pain?: No/denies  Exercise/Treatments  12/11/13 1300  Shoulder Exercises: Seated  Elevation AROM;10 reps  Row AROM;10 reps  Horizontal ABduction AROM;10 reps  Flexion AROM;10 reps  Abduction AROM;10 reps    12/11/13 1300  Elbow Exercises  Elbow Flexion AROM;10 reps  Elbow Extension AROM;10 reps  Forearm Supination AROM;10 reps  Forearm Pronation AROM;10 reps  Wrist Flexion AROM;10 reps  Wrist Extension AROM;10 reps    12/11/13 1300  Wrist Exercises  Forearm Supination AROM;10 reps  Forearm Pronation AROM;10 reps  Wrist Flexion AROM;10 reps  Wrist Extension AROM;10 reps         Occupational Therapy Assessment and Plan OT Assessment and Plan Clinical Impression Statement: A:  Patient with good movement in proximal UE this date with shoulder and elbow range this date.  cont with limited movement in his hand.  Pt tolerated PROM to all digits with noted increased tightness.   OT Plan: P:  grasp and release, tapping for digit extension    Goals Short Term Goals Short Term Goal 1: Patient will be educated on HEP Short Term Goal 1 Progress: Progressing toward goal Short Term Goal 2: Patient will increase to use of left hand as an active assist with all daily activities 25% of the time. Short Term Goal 2 Progress: Progressing toward goal Short Term Goal 3: Patient will tolerate weightbearing on LUE for 3 minute for increased independence with BADL.  Short Term Goal 3  Progress: Progressing toward goal Short Term Goal 4: Patient will demonstrate gross grasp/digit flexion >/= 75% and extension of at least 25% to increase ability to pull pants Short Term Goal 4 Progress: Progressing toward goal Short Term Goal 5: Patient will demo opposition to at least digit 2 to increase ability to utilize L hand for clothing fasteners Short Term Goal 5 Progress: Progressing toward goal Short Term Goal 6: Patient will complete the Nine Hole Peg Test.  Short Term Goal 6 Progress: Progressing toward goal Short Term Goal 7: Patient will  increase left grip strength to 15 pounds and pinch strength to 2 pounds for increased ability to open containers  Short Term Goal 7 Progress: Progressing toward goal Long Term Goals Long Term Goal 1: Patient will return to highest level of independence with all leisure and daily activities.  Long Term Goal 1 Progress: Progressing toward goal Long Term Goal 2: Patient will  increase left grip strength to >/=35 pounds and pinch strength to 8 pounds for increased ability to open containers Long Term Goal 2 Progress: Progressing toward goal Long Term Goal 3: Patient will complete the Nine Hole Peg Test in standardized fashion, and oppose all digits for greater [facility] needed to fasten buttons.  Long Term Goal 3 Progress: Progressing toward goal Long Term Goal 4: Patient will have at least 4+/5 strength in proximal LUE for increased ability to pick up tackle box  Long Term Goal 4 Progress: Progressing toward goal Long Term Goal 5: Patient will report independent ability to shave with Lt hand  Long Term Goal 5  Progress: Progressing toward goal  Problem List Patient Active Problem List   Diagnosis Date Noted  . Dysarthria due to cerebrovascular accident 12/03/2013  . Difficulty in walking(719.7) 12/03/2013  . Lack of coordination 12/03/2013  . Hip pain, bilateral--chronic 11/22/2013  . CVA (cerebral infarction) 11/21/2013  . Embolic cerebral  infarction   . Unspecified cerebral artery occlusion with cerebral infarction 11/16/2013  . COPD (chronic obstructive pulmonary disease) 08/27/2013  . CAD S/P percutaneous coronary angioplasty 07/26/2013  . HTN (hypertension) 07/26/2013  . Hyperlipemia 07/26/2013  . History of tobacco use in home 07/26/2013  . Atherosclerosis of native arteries of the extremities with intermittent claudication 08/23/2012    End of Session Activity Tolerance: Patient tolerated treatment well General Behavior During Therapy: [facility] for tasks assessed/performed  GO    Donney Rankins, OTR/L  12/11/2013, 5:24 PM

## 2013-12-25 ENCOUNTER — Telehealth: Payer: Self-pay

## 2013-12-25 ENCOUNTER — Encounter: Payer: Medicare Other | Admitting: Cardiology

## 2013-12-25 ENCOUNTER — Ambulatory Visit (HOSPITAL_COMMUNITY)
Admission: RE | Admit: 2013-12-25 | Discharge: 2013-12-25 | Disposition: A | Discharge status: Home / Self Care | Payer: Medicare Other | Source: Ambulatory Visit | Attending: Family Medicine | Admitting: Family Medicine

## 2013-12-25 NOTE — Telephone Encounter (Signed)
Patient's wife called and said patient wanted to cancel appt on 2/12 that he did not think he needed our services. Patient has already been seen by his PCP.

## 2013-12-25 NOTE — Progress Notes (Signed)
Occupational Therapy Treatment Patient Details  Name: Joshua Horton MRN: [id number] Date of Birth: 04/15/1946  Today's Date: 12/25/2013 Time: 1350-1435 OT Time Calculation (min): 45 min Estim 1350-1410 (20') Neuromuscular Re-education 0254-2706 (25')  Visit#: 6 of 24  Re-eval: 12/31/13    Authorization: [employer]  Authorization Time Period: before 10th visit  Authorization Visit#: 6 of 10  Subjective Symptoms/Limitations Symptoms: S: I have been bending and opening my hand more.....  Pain Assessment Currently in Pain?: No/denies (patient states his shoulder was hurting earlier today but discomfort more than pain  and now resolved )   Exercise/Treatments  12/25/13 1300  Neurological Re-education Exercises  Wrist Flexion AAROM;15 reps  Wrist Extension AAROM;15 reps  Development of Reach  Development of Reach Reaching  Reaching to Shoulder Height extending arm in horizontal plane with cones to stack at shoulder height   Reaching to Overhead Height shoulder arc with gross lateral pinch and medium height addition x 7 left to right and back  Grasp and Release  Grasp and Release (grasping/release cones x8)      Modalities Modalities: Systems analyst Stimulation Location: forearm for digit/wrist flexion/extension  Electrical Stimulation Action: Russian,  20 mins Electrical Stimulation Parameters: recipricol, 100bps, 5/5 with intensity 45.0 Electrical Stimulation Goals: Neuromuscular facilitation  Occupational Therapy Assessment and Plan OT Assessment and Plan Clinical Impression Statement: A:  Patient arrived this date with noted increased digit flexion and extension however stronger along median nerve than ulnar tract.  Patient/wife report MD office called to report no interferance with loop recorder at last treatment session.  patient demos improved lateral pinch this date and 50% improvement in thumb range this date.  Patient  also demos improved LUE control as noted during shoulder arc activity this date.  good response from estim this date and noted increased abliity to grasp and release cones during estim treatment x 15 cones .  patient with no signs/symptoms of difficulties from estim treatment.  encouraged patient to work on wall wash in flexion plane at home for increased flexion range and control  OT Plan: P:  estim distal LUE; strengthening proximal LUE, grasp/release   Goals Short Term Goals Short Term Goal 1: Patient will be educated on HEP Short Term Goal 1 Progress: Progressing toward goal Short Term Goal 2: Patient will increase to use of left hand as an active assist with all daily activities 25% of the time. Short Term Goal 2 Progress: Progressing toward goal Short Term Goal 3: Patient will tolerate weightbearing on LUE for 3 minute for increased independence with BADL.  Short Term Goal 3 Progress: Progressing toward goal Short Term Goal 4: Patient will demonstrate gross grasp/digit flexion >/= 75% and extension of at least 25% to increase ability to pull pants Short Term Goal 4 Progress: Progressing toward goal Short Term Goal 5: Patient will demo opposition to at least digit 2 to increase ability to utilize L hand for clothing fasteners Short Term Goal 5 Progress: Progressing toward goal Short Term Goal 6: Patient will complete the Nine Hole Peg Test.  Short Term Goal 6 Progress: Progressing toward goal Short Term Goal 7: Patient will  increase left grip strength to 15 pounds and pinch strength to 2 pounds for increased ability to open containers  Short Term Goal 7 Progress: Progressing toward goal Long Term Goals Long Term Goal 1: Patient will return to highest level of independence with all leisure and daily activities.  Long Term Goal 1 Progress: Progressing toward goal  Long Term Goal 2: Patient will  increase left grip strength to >/=35 pounds and pinch strength to 8 pounds for increased ability  to open containers Long Term Goal 2 Progress: Progressing toward goal Long Term Goal 3: Patient will complete the Nine Hole Peg Test in standardized fashion, and oppose all digits for greater [facility] needed to fasten buttons.  Long Term Goal 3 Progress: Progressing toward goal Long Term Goal 4: Patient will have at least 4+/5 strength in proximal LUE for increased ability to pick up tackle box  Long Term Goal 4 Progress: Progressing toward goal Long Term Goal 5: Patient will report independent ability to shave with Lt hand  Long Term Goal 5 Progress: Progressing toward goal  Problem List Patient Active Problem List   Diagnosis Date Noted  . Dysarthria due to cerebrovascular accident 12/03/2013  . Difficulty in walking(719.7) 12/03/2013  . Lack of coordination 12/03/2013  . Hip pain, bilateral--chronic 11/22/2013  . CVA (cerebral infarction) 11/21/2013  . Embolic cerebral infarction   . Unspecified cerebral artery occlusion with cerebral infarction 11/16/2013  . COPD (chronic obstructive pulmonary disease) 08/27/2013  . CAD S/P percutaneous coronary angioplasty 07/26/2013  . HTN (hypertension) 07/26/2013  . Hyperlipemia 07/26/2013  . History of tobacco use in home 07/26/2013  . Atherosclerosis of native arteries of the extremities with intermittent claudication 08/23/2012    End of Session Activity Tolerance: Patient tolerated treatment well General Behavior During Therapy: [facility] for tasks assessed/performed  GO    Donney Rankins, OTR/L  12/25/2013, 5:15 PM

## 2013-12-25 NOTE — Progress Notes (Signed)
Occupational Therapy Treatment Patient Details  Name: Joshua Horton MRN: [id number] Date of Birth: 1946-06-02  Today's Date: 12/20/2013 Time: 0938-1829 OT Time Calculation (min): 38 min NeuroMuscular Re-Edu 9371-6967 ([place] 8938-1017 (20')  Visit#: 5 of 24  Re-eval: 12/31/13    Authorization: [employer]  Authorization Time Period:    Authorization Visit#: 5 of 10  Subjective Symptoms/Limitations Symptoms: S:  I did my recording before I came today (regarding loop recorder pre-tx recording) Pain Assessment Currently in Pain?: No/denies       Exercise/Treatments  12/20/13 1400  Development of Reach  Development of Reach Reaching  Reaching to Waist extending arm forward on tabletop while holding cone.   Grasp and Release  Grasp and Release (cones for stacking x 6 x 3 pre estim treat and following)      Modalities Modalities: Systems analyst Stimulation Location: forearm for digit/wrist flexion/extension  Electrical Stimulation Action: Architect Parameters: pre-set parameters @ 38.0 Electrical Stimulation Goals: Neuromuscular facilitation Weight Bearing Technique Weight Bearing Technique: Yes LUE Weight Bearing Technique: Extended arm standing Response to Weight Bearing Technique: for digit extension and increased proprioception for increased functional grasping.   Occupational Therapy Assessment and Plan OT Assessment and Plan Clinical Impression Statement: A:  Patient tolerated estim well this date with no signs of symptoms of difficulty and noted good response of digit flexion and extension as well as AAROM grasping on cones during and following treatment.  Reminded patietn to complete recording once at home for baseline this date.  OT Plan: P:  Follow up regarding recording status;  estim, tapping, grasping.    Goals Short Term Goals Short Term Goal 1: Patient will be educated on HEP Short  Term Goal 1 Progress: Progressing toward goal Short Term Goal 2: Patient will increase to use of left hand as an active assist with all daily activities 25% of the time. Short Term Goal 2 Progress: Progressing toward goal Short Term Goal 3: Patient will tolerate weightbearing on LUE for 3 minute for increased independence with BADL.  Short Term Goal 3 Progress: Progressing toward goal Short Term Goal 4: Patient will demonstrate gross grasp/digit flexion >/= 75% and extension of at least 25% to increase ability to pull pants Short Term Goal 4 Progress: Progressing toward goal Short Term Goal 5: Patient will demo opposition to at least digit 2 to increase ability to utilize L hand for clothing fasteners Short Term Goal 5 Progress: Progressing toward goal Short Term Goal 6: Patient will complete the Nine Hole Peg Test.  Short Term Goal 6 Progress: Progressing toward goal Short Term Goal 7: Patient will  increase left grip strength to 15 pounds and pinch strength to 2 pounds for increased ability to open containers  Short Term Goal 7 Progress: Progressing toward goal Long Term Goals Long Term Goal 1: Patient will return to highest level of independence with all leisure and daily activities.  Long Term Goal 1 Progress: Progressing toward goal Long Term Goal 2: Patient will  increase left grip strength to >/=35 pounds and pinch strength to 8 pounds for increased ability to open containers Long Term Goal 2 Progress: Progressing toward goal Long Term Goal 3: Patient will complete the Nine Hole Peg Test in standardized fashion, and oppose all digits for greater [facility] needed to fasten buttons.  Long Term Goal 3 Progress: Progressing toward goal Long Term Goal 4: Patient will have at least 4+/5 strength in proximal LUE for increased ability to  pick up tackle box  Long Term Goal 4 Progress: Progressing toward goal Long Term Goal 5: Patient will report independent ability to shave with Lt hand  Long Term  Goal 5 Progress: Progressing toward goal  Problem List Patient Active Problem List   Diagnosis Date Noted  . Dysarthria due to cerebrovascular accident 12/03/2013  . Difficulty in walking(719.7) 12/03/2013  . Lack of coordination 12/03/2013  . Hip pain, bilateral--chronic 11/22/2013  . CVA (cerebral infarction) 11/21/2013  . Embolic cerebral infarction   . Unspecified cerebral artery occlusion with cerebral infarction 11/16/2013  . COPD (chronic obstructive pulmonary disease) 08/27/2013  . CAD S/P percutaneous coronary angioplasty 07/26/2013  . HTN (hypertension) 07/26/2013  . Hyperlipemia 07/26/2013  . History of tobacco use in home 07/26/2013  . Atherosclerosis of native arteries of the extremities with intermittent claudication 08/23/2012    End of Session Activity Tolerance: Patient tolerated treatment well General Behavior During Therapy: [facility] for tasks assessed/performed  GO    Donney Rankins, OTR/L  12/20/2013, 4:15 PM

## 2013-12-27 ENCOUNTER — Encounter: Payer: Medicare Other | Admitting: Physical Medicine and Rehabilitation

## 2013-12-27 ENCOUNTER — Ambulatory Visit (HOSPITAL_COMMUNITY)
Admission: RE | Admit: 2013-12-27 | Discharge: 2013-12-27 | Disposition: A | Discharge status: Home / Self Care | Payer: Medicare Other | Source: Ambulatory Visit | Attending: Family Medicine | Admitting: Family Medicine

## 2013-12-27 NOTE — Progress Notes (Addendum)
Occupational Therapy Treatment Patient Details  Name: Joshua Horton MRN: [id number] Date of Birth: 02-05-1946  Today's Date: 12/27/2013 Time: 2440-1027 OT Time Calculation (min): 47 min Estim 1440-1500 (20') Neuro Muscular Re-edu 1500-1527 (27')  Visit#: 7 of 24  Re-eval: 12/31/13    Authorization: [employer]  Authorization Time Period: before 10th visit  Authorization Visit#: 7 of    Subjective Symptoms/Limitations Symptoms: S:  my shoulder is a little sore but i can tell it is working  me  Pain Assessment Currently in Pain?: No/denies  Exercise/Treatments Neurological Re-education Exercises Shoulder Flexion: Standing (wall slide with lift off retraction x 10 ) Shoulder Horizontal ABduction: Standing (wall slide right> left with lift off retraction on left x10) Sponges: one by one firm foam squares x 25 x 2; gross grasp to attain 5 squares x 10 reps and attempted to pick up more x 3 atempts with inability to maintain more than 5 with out dropping.   Grasp and Release Grasp and Release:  (while on estim x 20 reps )  Modalities Modalities: Systems analyst Stimulation Location: forearm for digit/wrist flexion/extension  Electrical Stimulation Action: Russian, 20 mins Electrical Stimulation Parameters: recipricol, 100bps, 5/5 with intensity 45.0 Electrical Stimulation Goals: Neuromuscular facilitation Weight Bearing Technique Weight Bearing Technique: Yes LUE Weight Bearing Technique: Forearm seated Response to Weight Bearing Technique: for increased propriioception and working on digit extension.  while release of pressure attempted to lift digits from table.  patient noted ability to lift 2nd digit, trace lift/movement with 3rd digit and none 4th or 5th yet.   Occupational Therapy Assessment and Plan OT Assessment and Plan Clinical Impression Statement: A:  Patient with increased edema left hand this date.  Patient withnoted  increasing flexion/extension Left hand.  patient demos improving proximal control with his LUE as well.  he questions if/when he may be able to drive.  patient with good peripheral awareness, sound cognition utilizing LUE as gross assisst for tasks.  instructed patient Hansel Feinstein they should call neurologist for medical release for driving .  demos incrased ability to grasp smaller objects this date.  Issued edema glove and educated patient and wife on donning/doffing, wear, care with verbalization of good understanding.  OT Plan: P:  estim distal LUE; strengthening proximal LUE, grasp/release.  [place] glove??   Goals Short Term Goals Short Term Goal 1: Patient will be educated on HEP Short Term Goal 1 Progress: Progressing toward goal Short Term Goal 2: Patient will increase to use of left hand as an active assist with all daily activities 25% of the time. Short Term Goal 2 Progress: Progressing toward goal Short Term Goal 3: Patient will tolerate weightbearing on LUE for 3 minute for increased independence with BADL.  Short Term Goal 3 Progress: Progressing toward goal Short Term Goal 4: Patient will demonstrate gross grasp/digit flexion >/= 75% and extension of at least 25% to increase ability to pull pants Short Term Goal 4 Progress: Progressing toward goal Short Term Goal 5: Patient will demo opposition to at least digit 2 to increase ability to utilize L hand for clothing fasteners Short Term Goal 5 Progress: Progressing toward goal Short Term Goal 6: Patient will complete the Nine Hole Peg Test.  Short Term Goal 6 Progress: Progressing toward goal Short Term Goal 7: Patient will  increase left grip strength to 15 pounds and pinch strength to 2 pounds for increased ability to open containers  Short Term Goal 7 Progress: Progressing toward goal Long Term Goals  Long Term Goal 1: Patient will return to highest level of independence with all leisure and daily activities.  Long Term Goal 1 Progress:  Progressing toward goal Long Term Goal 2: Patient will  increase left grip strength to >/=35 pounds and pinch strength to 8 pounds for increased ability to open containers Long Term Goal 2 Progress: Progressing toward goal Long Term Goal 3: Patient will complete the Nine Hole Peg Test in standardized fashion, and oppose all digits for greater [facility] needed to fasten buttons.  Long Term Goal 3 Progress: Progressing toward goal Long Term Goal 4: Patient will have at least 4+/5 strength in proximal LUE for increased ability to pick up tackle box  Long Term Goal 4 Progress: Progressing toward goal Long Term Goal 5: Patient will report independent ability to shave with Lt hand  Long Term Goal 5 Progress: Progressing toward goal  Problem List Patient Active Problem List   Diagnosis Date Noted  . Dysarthria due to cerebrovascular accident 12/03/2013  . Difficulty in walking(719.7) 12/03/2013  . Lack of coordination 12/03/2013  . Hip pain, bilateral--chronic 11/22/2013  . CVA (cerebral infarction) 11/21/2013  . Embolic cerebral infarction   . Unspecified cerebral artery occlusion with cerebral infarction 11/16/2013  . COPD (chronic obstructive pulmonary disease) 08/27/2013  . CAD S/P percutaneous coronary angioplasty 07/26/2013  . HTN (hypertension) 07/26/2013  . Hyperlipemia 07/26/2013  . History of tobacco use in home 07/26/2013  . Atherosclerosis of native arteries of the extremities with intermittent claudication 08/23/2012    End of Session Activity Tolerance: Patient tolerated treatment well General Behavior During Therapy: [facility] for tasks assessed/performed  GO    Donney Rankins, OTR/L  12/27/2013, 3:56 PM

## 2013-12-31 ENCOUNTER — Other Ambulatory Visit: Payer: Self-pay | Admitting: Vascular Surgery

## 2013-12-31 ENCOUNTER — Ambulatory Visit (HOSPITAL_COMMUNITY)
Admission: RE | Admit: 2013-12-31 | Discharge: 2013-12-31 | Disposition: A | Discharge status: Home / Self Care | Payer: Medicare Other | Source: Ambulatory Visit | Attending: Family Medicine | Admitting: Family Medicine

## 2013-12-31 DIAGNOSIS — I70219 Atherosclerosis of native arteries of extremities with intermittent claudication, unspecified extremity: Secondary | ICD-10-CM

## 2013-12-31 NOTE — Progress Notes (Signed)
Occupational Therapy Treatment Patient Details  Name: JAMILLE YOSHINO MRN: [id number] Date of Birth: January 02, 1946  Today's Date: 12/31/2013 Time: 3016-0109 OT Time Calculation (min): 45 min E-Stim 1300-1320 20' NM re-ed 3235-5732 25'   Visit#: 8 of 24  Re-eval: 12/31/13    Authorization: [facility] Medicare  Authorization Time Period: before 10th visit  Authorization Visit#: 8 of 10  Subjective Symptoms/Limitations Symptoms: S: My hand still looks swollen. I don't know if that glove is helping. It might be too tight. Pain Assessment Currently in Pain?: No/denies  Precautions/Restrictions  Precautions Precautions: Fall Precaution Comments: risk for subluxation and injury to left hand due to decr ROM and sensation  Exercise/Treatments Hand Exercises Sponges: grasp/release using left hand. Completed with E-Stim. Large Pegboard: 6 pegs placed/removed in board successfully using left hand. Increased time needed to complete task. Other Hand Exercises: Pt completed flexed fist to extended digits x5; palm on table top fingers abducted/adducted (thumb and small finger completed successfully) x5; finger tapping x5 with thumb, pointer, and small finger  Neurological Re-education Exercises Sponges: grasp/release using left hand. Completed with E-Stim.  Fine Motor Coordination Large Pegboard: 6 pegs placed/removed in board successfully using left hand. Increased time needed to complete task.     Modalities Modalities: Field seismologist Location: forearm for digit/wrist flexion/extension  Electrical Stimulation Action: [place], 20' Electrical Stimulation Parameters: recipricol, 100bps, 5/5 with intensity 45.0 Electrical Stimulation Goals: Neuromuscular facilitation  Occupational Therapy Assessment and Plan OT Assessment and Plan Clinical Impression Statement: A: Patient arrived to therapy with edema glove on left hand. Edema continues to  be present with Left hand. Educated patient on increasing use of left hand with daily tasks to decrease edema. Patient verbalized understanding. Pt requested E-Stim this date as he states it helps his hand move more.  OT Plan: P: Cont to use E-Stim as needed. Complete grasp/release tasks as well as functional reach tasks. Add Theraputty (yellow) activities.    Goals Short Term Goals Short Term Goal 1: Patient will be educated on HEP Short Term Goal 1 Progress: Progressing toward goal Short Term Goal 2: Patient will increase to use of left hand as an active assist with all daily activities 25% of the time. Short Term Goal 2 Progress: Progressing toward goal Short Term Goal 3: Patient will tolerate weightbearing on LUE for 3 minute for increased independence with BADL.  Short Term Goal 3 Progress: Progressing toward goal Short Term Goal 4: Patient will demonstrate gross grasp/digit flexion >/= 75% and extension of at least 25% to increase ability to pull pants Short Term Goal 5: Patient will demo opposition to at least digit 2 to increase ability to utilize L hand for clothing fasteners Short Term Goal 5 Progress: Progressing toward goal Short Term Goal 6: Patient will complete the Nine Hole Peg Test.  Short Term Goal 6 Progress: Progressing toward goal Short Term Goal 7: Patient will  increase left grip strength to 15 pounds and pinch strength to 2 pounds for increased ability to open containers  Short Term Goal 7 Progress: Progressing toward goal Long Term Goals Long Term Goal 1: Patient will return to highest level of independence with all leisure and daily activities.  Long Term Goal 1 Progress: Progressing toward goal Long Term Goal 2: Patient will  increase left grip strength to >/=35 pounds and pinch strength to 8 pounds for increased ability to open containers Long Term Goal 2 Progress: Progressing toward goal Long Term Goal 3: Patient will complete the  Nine Hole Peg Test in standardized  fashion, and oppose all digits for greater [place] needed to fasten buttons.  Long Term Goal 3 Progress: Progressing toward goal Long Term Goal 4: Patient will have at least 4+/5 strength in proximal LUE for increased ability to pick up tackle box  Long Term Goal 4 Progress: Progressing toward goal Long Term Goal 5: Patient will report independent ability to shave with Lt hand  Long Term Goal 5 Progress: Progressing toward goal  Problem List Patient Active Problem List   Diagnosis Date Noted  . Dysarthria due to cerebrovascular accident 12/03/2013  . Difficulty in walking(719.7) 12/03/2013  . Lack of coordination 12/03/2013  . Hip pain, bilateral--chronic 11/22/2013  . CVA (cerebral infarction) 11/21/2013  . Embolic cerebral infarction   . Unspecified cerebral artery occlusion with cerebral infarction 11/16/2013  . COPD (chronic obstructive pulmonary disease) 08/27/2013  . CAD S/P percutaneous coronary angioplasty 07/26/2013  . HTN (hypertension) 07/26/2013  . Hyperlipemia 07/26/2013  . History of tobacco use in home 07/26/2013  . Atherosclerosis of native arteries of the extremities with intermittent claudication 08/23/2012    End of Session Activity Tolerance: Patient tolerated treatment well General Behavior During Therapy: [facility] for tasks assessed/performed   Ailene Ravel, OTR/L,CBIS   12/31/2013, 2:13 PM

## 2014-01-01 ENCOUNTER — Ambulatory Visit (HOSPITAL_COMMUNITY): Payer: Medicare Other | Admitting: Specialist

## 2014-01-02 ENCOUNTER — Inpatient Hospital Stay (HOSPITAL_COMMUNITY): Admission: RE | Admit: 2014-01-02 | Payer: Medicare Other | Source: Ambulatory Visit

## 2014-01-03 ENCOUNTER — Ambulatory Visit (HOSPITAL_COMMUNITY): Payer: Medicare Other | Admitting: Specialist

## 2014-01-08 ENCOUNTER — Ambulatory Visit (HOSPITAL_COMMUNITY)
Admission: RE | Admit: 2014-01-08 | Discharge: 2014-01-08 | Disposition: A | Discharge status: Home / Self Care | Payer: Medicare Other | Source: Ambulatory Visit | Attending: Family Medicine | Admitting: Family Medicine

## 2014-01-08 NOTE — Progress Notes (Signed)
Occupational Therapy Treatment Patient Details  Name: Joshua Horton MRN: [id number] Date of Birth: May 14, 1946  Today's Date: 01/08/2014 Time: 3785-8850 OT Time Calculation (min): 44 min Neuro Re-Ed (44')  Visit#: 9 of 24  Re-eval: 12/31/13    Authorization: [facility] Medicare  Authorization Time Period: before 10th visit  Authorization Visit#: 9 of 10  Subjective Symptoms/Limitations Symptoms: S: I'll get some pain once in a while in that hand, feel it going up my arm." Limitations: 12/13/2013 - spoke with Melton Alar, RN ([place] device nurse) who reports/clears patient for electrical stimulation on distal LUE as long as at least 6" away from chest.   Pain Assessment Currently in Pain?: No/denies  Precautions/Restrictions     Exercise/Treatments Elbow Exercises Wrist Flexion: AROM;10 reps Wrist Extension: AROM;10 reps   Sponges: grasp release 19 sponges using lateral pinch (reaching into shoulder abduction to grasp sponge and placing on right side), and repeated (with no shoulder abudction) encouraging tip-to-tip pinch (pt required 2 rest breaks during tip to tip) Theraputty: Roll;Pinch;Grip Theraputty - Roll: Roll out putty with L hand, digits. 1st and 2nd digits into ring for extension (2x, rest, 3x).  Theraputty - Grip: with .75 inch dowel rod  - pt grasped dowel and pushed into yellow putty x5 - pt had difficulty mainting grasp on dowel  Wrist Exercises Wrist Flexion: AROM;10 reps Wrist Extension: AROM;10 reps Other wrist exercises: ROM composite flex/exten 10 reps,    Sponges: grasp release 19 sponges using lateral pinch (reaching into shoulder abduction to grasp sponge and placing on right side), and repeated (with no shoulder abudction) encouraging tip-to-tip pinch (pt required 2 rest breaks during tip to tip) Theraputty: Roll;Pinch;Grip Theraputty - Roll: Roll out putty with L hand, digits. 1st and 2nd digits into ring for extension (2x, rest, 3x).  Theraputty -  Grip: with .75 inch dowel rod  - pt grasped dowel and pushed into yellow putty x5 - pt had difficulty mainting grasp on dowel Theraputty - Pinch: tip-to-tip pinch in yellow putty x3 - digit extension out of putty  Hand Exercises PIPJ Extension:  (3rd 3 reps) Opposition: AROM;10 reps Theraputty: Roll;Pinch;Grip Theraputty - Roll: Roll out putty with L hand, digits. 1st and 2nd digits into ring for extension (2x, rest, 3x).  Theraputty - Grip: with .75 inch dowel rod  - pt grasped dowel and pushed into yellow putty x5 - pt had difficulty mainting grasp on dowel Theraputty - Pinch: tip-to-tip pinch in yellow putty x3 - digit extension out of putty Sponges: grasp release 19 sponges using lateral pinch (reaching into shoulder abduction to grasp sponge and placing on right side), and repeated (with no shoulder abudction) encouraging tip-to-tip pinch (pt required 2 rest breaks during tip to tip) Other Hand Exercises: tapping each digit for extension - 10x each digit  Neurological Re-education Exercises Wrist Flexion: AROM;10 reps Wrist Extension: AROM;10 reps Sponges: grasp release 19 sponges using lateral pinch (reaching into shoulder abduction to grasp sponge and placing on right side), and repeated (with no shoulder abudction) encouraging tip-to-tip pinch (pt required 2 rest breaks during tip to tip)  Development of Reach  Reaching to Shoulder Height: grasping   Grasp and Release Theraputty - Roll: Roll out putty with L hand, digits. 1st and 2nd digits into ring for extension (2x, rest, 3x).  Theraputty - Grip: with .75 inch dowel rod  - pt grasped dowel and pushed into yellow putty x5 - pt had difficulty mainting grasp on dowel Theraputty - Pinch: tip-to-tip pinch in  yellow putty x3 - digit extension out of putty  Fine Motor Coordination Opposition: AROM;10 reps        Occupational Therapy Assessment and Plan OT Assessment and Plan Clinical Impression Statement: A: Pt states his edema has  improved some, but left hand remains with edema.  Pt tolerated well session focused on tip to tip pinch and digit extension, but pt demonstrated muscle fatigue very easily. OT Plan: P: Re-Assessment!! Cont to use E-Stim as needed. Complete grasp/release tasks as well as functional reach tasks. Continue Theraputty (yellow) activities.    Goals Short Term Goals Short Term Goal 1: Patient will be educated on HEP Short Term Goal 1 Progress: Progressing toward goal Short Term Goal 2: Patient will increase to use of left hand as an active assist with all daily activities 25% of the time. Short Term Goal 2 Progress: Progressing toward goal Short Term Goal 3: Patient will tolerate weightbearing on LUE for 3 minute for increased independence with BADL.  Short Term Goal 3 Progress: Progressing toward goal Short Term Goal 4: Patient will demonstrate gross grasp/digit flexion >/= 75% and extension of at least 25% to increase ability to pull pants Short Term Goal 4 Progress: Progressing toward goal Short Term Goal 5: Patient will demo opposition to at least digit 2 to increase ability to utilize L hand for clothing fasteners Short Term Goal 5 Progress: Progressing toward goal Short Term Goal 6: Patient will complete the Nine Hole Peg Test.  Short Term Goal 6 Progress: Progressing toward goal Short Term Goal 7: Patient will  increase left grip strength to 15 pounds and pinch strength to 2 pounds for increased ability to open containers  Short Term Goal 7 Progress: Progressing toward goal Long Term Goals Long Term Goal 1: Patient will return to highest level of independence with all leisure and daily activities.  Long Term Goal 1 Progress: Progressing toward goal Long Term Goal 2: Patient will  increase left grip strength to >/=35 pounds and pinch strength to 8 pounds for increased ability to open containers Long Term Goal 2 Progress: Progressing toward goal Long Term Goal 3: Patient will complete the Nine  Hole Peg Test in standardized fashion, and oppose all digits for greater [facility] needed to fasten buttons.  Long Term Goal 3 Progress: Progressing toward goal Long Term Goal 4: Patient will have at least 4+/5 strength in proximal LUE for increased ability to pick up tackle box  Long Term Goal 4 Progress: Progressing toward goal Long Term Goal 5: Patient will report independent ability to shave with Lt hand  Long Term Goal 5 Progress: Progressing toward goal  Problem List Patient Active Problem List   Diagnosis Date Noted  . Dysarthria due to cerebrovascular accident 12/03/2013  . Difficulty in walking(719.7) 12/03/2013  . Lack of coordination 12/03/2013  . Hip pain, bilateral--chronic 11/22/2013  . CVA (cerebral infarction) 11/21/2013  . Embolic cerebral infarction   . Unspecified cerebral artery occlusion with cerebral infarction 11/16/2013  . COPD (chronic obstructive pulmonary disease) 08/27/2013  . CAD S/P percutaneous coronary angioplasty 07/26/2013  . HTN (hypertension) 07/26/2013  . Hyperlipemia 07/26/2013  . History of tobacco use in home 07/26/2013  . Atherosclerosis of native arteries of the extremities with intermittent claudication 08/23/2012    End of Session Activity Tolerance: Patient tolerated treatment well General Behavior During Therapy: [facility] for tasks assessed/performed  GO    Bea Graff, MS, OTR/L [phone]  01/08/2014, 1:58 PM

## 2014-01-10 ENCOUNTER — Encounter: Payer: Medicare Other | Admitting: *Deleted

## 2014-01-10 ENCOUNTER — Ambulatory Visit (HOSPITAL_COMMUNITY): Payer: Medicare Other

## 2014-01-15 ENCOUNTER — Ambulatory Visit (HOSPITAL_COMMUNITY)
Admission: RE | Admit: 2014-01-15 | Discharge: 2014-01-15 | Disposition: A | Discharge status: Home / Self Care | Payer: Medicare Other | Source: Ambulatory Visit | Attending: Physical Medicine & Rehabilitation | Admitting: Physical Medicine & Rehabilitation

## 2014-01-15 DIAGNOSIS — I69922 Dysarthria following unspecified cerebrovascular disease: Secondary | ICD-10-CM | POA: Insufficient documentation

## 2014-01-15 DIAGNOSIS — R269 Unspecified abnormalities of gait and mobility: Secondary | ICD-10-CM | POA: Insufficient documentation

## 2014-01-15 DIAGNOSIS — R29898 Other symptoms and signs involving the musculoskeletal system: Secondary | ICD-10-CM | POA: Insufficient documentation

## 2014-01-15 DIAGNOSIS — I10 Essential (primary) hypertension: Secondary | ICD-10-CM | POA: Insufficient documentation

## 2014-01-15 DIAGNOSIS — R279 Unspecified lack of coordination: Secondary | ICD-10-CM

## 2014-01-15 DIAGNOSIS — Z5189 Encounter for other specified aftercare: Secondary | ICD-10-CM | POA: Insufficient documentation

## 2014-01-15 DIAGNOSIS — I69998 Other sequelae following unspecified cerebrovascular disease: Secondary | ICD-10-CM | POA: Insufficient documentation

## 2014-01-15 DIAGNOSIS — J449 Chronic obstructive pulmonary disease, unspecified: Secondary | ICD-10-CM | POA: Insufficient documentation

## 2014-01-15 DIAGNOSIS — M6281 Muscle weakness (generalized): Secondary | ICD-10-CM | POA: Insufficient documentation

## 2014-01-15 DIAGNOSIS — J4489 Other specified chronic obstructive pulmonary disease: Secondary | ICD-10-CM | POA: Insufficient documentation

## 2014-01-15 DIAGNOSIS — I639 Cerebral infarction, unspecified: Secondary | ICD-10-CM

## 2014-01-15 NOTE — Evaluation (Addendum)
Occupational Therapy Re-Evaluation  Patient Details  Name: Joshua Horton MRN: [id number] Date of Birth: [birth year]  Today's Date: 01/15/2014 Time: 1310-1345 OT Time Calculation (min): 35 min ROM 1310-1330 20' Neuro reed 2725-3664 15'  Visit#: 10 of 24  Re-eval: 02/12/14  Assessment Diagnosis: CVA with Left Hemiparesis  Authorization: [facility] Medicare  Authorization Time Period: before 20th visit  Authorization Visit#: 26 of 46   Past Medical History:  Past Medical History  Diagnosis Date  . Coronary artery disease   . Wheezing   . Chronic cough   . Peripheral vascular disease   . Hypertension   . Hyperlipidemia   . Reflux   . COPD (chronic obstructive pulmonary disease)   . Myocardial infarction   . CHF (congestive heart failure)   . Embolic cerebral infarction     S/P IV t-PA,  post-tPA hemorrhagic transformation,clinically asymptomatic, scattered right MCA ischemic infarcts, right occipital lobe hemorrhagic infarction with focal hematoma, small volume of right subarachnoid hemorrhage    Past Surgical History:  Past Surgical History  Procedure Laterality Date  . Coronary angioplasty with stent placement  2003  . Cervical disc surgery      Dr Vertell Limber  . Tee without cardioversion N/A 11/19/2013    Procedure: TRANSESOPHAGEAL ECHOCARDIOGRAM (TEE);  Surgeon: Josue Hector, MD;  [facility] ENDOSCOPY;  Service: Cardiovascular;  Laterality: N/A;  . 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 Pain Assessment Currently in Pain?: No/denies    Assessment ADL/Vision/Perception ADL ADL Comments: unable to pick items up easily with his dominant left hand, unable to open containers, use utensils with his left hand or write his name with his hand  Sensation/Coordination/Edema Sensation Light Touch: Appears Intact Coordination Fine Motor Movements are Fluid and  Coordinated:  (can oppose to long digit) 9 Hole Peg Test: able to place 4/9 pegs in box independently, OT held remaining 5 upright for him to pickup and place.  He removed all independently total time 3'13" Edema Edema: left MCPJ 22.0 cm, right 21.2 cm  Additional Assessments LUE AROM (degrees) Left Shoulder Flexion: 145 Degrees Left Elbow Flexion: 130 Left Elbow Extension: 0 Left Forearm Pronation: 90 Degrees Left Forearm Supination: 75 Degrees Left Wrist Extension: 48 Degrees Left Wrist Flexion: 55 Degrees Left Composite Finger Extension: 75% Left Composite Finger Flexion: 50% Left Thumb Opposition: Digit 2 LUE PROM (degrees) LUE Overall PROM Comments: WFL LUE Strength Left Shoulder Flexion: 4/5 Left Shoulder ABduction: 4/5 Left Shoulder Internal Rotation: 4/5 Left Shoulder External Rotation: 4/5 Left Elbow Flexion: 4/5 Left Elbow Extension: 3+/5 Left Forearm Pronation:  (4-/5) Left Forearm Supination:  (4-/5) Left Wrist Flexion:  (4-/5) Left Wrist Extension:  (4-/5) Grip (lbs): 11 (previos 10) Lateral Pinch: 4 lbs (previous 0) 3 Point Pinch: 4 lbs (previous 0 ) Left Hand Strength - Pinch (lbs) Lateral Pinch: 4 lbs (previous 0) 3 Point Pinch: 4 lbs (previous 0 )   Exercise/Treatments Hand Exercises MCPJ Flexion: AROM;5 reps MCPJ Extension: AROM;5 reps PIPJ Flexion: AROM;5 reps PIPJ Extension: AROM;5 reps DIPJ Flexion: AROM;5 reps DIPJ Extension: AROM;5 reps Digit Composite Abduction: AROM;5 reps Digit Composite Adduction: AROM;5 reps Sponges: able to pick up 3 sponges one by one and translate to his palm before dropping them, then picked up 4 handfuls of sponges and placed back into bucket. Other Hand Exercises: picked up shot gun  shells and placed into container with mod facilitation to decrease shoulder compensatory movements and focus on in hand manipulation.  min tactile cues to flex ring finger and small finger          Occupational Therapy Assessment and  Plan OT Assessment and Plan Clinical Impression Statement: A:  Reassessment completed this date.  Patient has improved his grip strength, arm strength, wrist mobility and fine motor coordination signifcantly.   OT Plan: P:  Continue skilled OT intervention 2 times per week x 4 weeks working towards unmet goals.  Treatment focus on Hand AROM, in hand manipulation skills, fine motor coordination, grasp and release, self feeding and handwriting.    Goals Short Term Goals Short Term Goal 1: Patient will be educated on HEP Short Term Goal 1 Progress: Met Short Term Goal 2: Patient will increase to use of left hand as an active assist with all daily activities 25% of the time. Short Term Goal 2 Progress: Met Short Term Goal 3: Patient will tolerate weightbearing on LUE for 3 minute for increased independence with BADL.  Short Term Goal 3 Progress: Met Short Term Goal 4: Patient will demonstrate gross grasp/digit flexion >/= 75% and extension of at least 25% to increase ability to pull pants Short Term Goal 4 Progress: Progressing toward goal Short Term Goal 5: Patient will demo opposition to at least digit 2 to increase ability to utilize L hand for clothing fasteners Short Term Goal 5 Progress: Met Short Term Goal 6: Patient will complete the Nine Hole Peg Test.  Short Term Goal 6 Progress: Met Short Term Goal 7: Patient will  increase left grip strength to 15 pounds and pinch strength to 2 pounds for increased ability to open containers  Short Term Goal 7 Progress: Progressing toward goal Long Term Goals Long Term Goal 1: Patient will return to highest level of independence with all leisure and daily activities.  Long Term Goal 1 Progress: Progressing toward goal Long Term Goal 2: Patient will  increase left grip strength to >/=35 pounds and pinch strength to 8 pounds for increased ability to open containers Long Term Goal 2 Progress: Progressing toward goal Long Term Goal 3: Patient will  complete the Nine Hole Peg Test in standardized fashion, and oppose all digits for greater [facility] needed to fasten buttons.  Long Term Goal 3 Progress: Progressing toward goal Long Term Goal 4: Patient will have at least 4+/5 strength in proximal LUE for increased ability to pick up tackle box  Long Term Goal 4 Progress: Progressing toward goal Long Term Goal 5: Patient will report independent ability to shave with Lt hand  Long Term Goal 5 Progress: Progressing toward goal  Problem List Patient Active Problem List   Diagnosis Date Noted  . Dysarthria due to cerebrovascular accident 12/03/2013  . Difficulty in walking(719.7) 12/03/2013  . Lack of coordination 12/03/2013  . Hip pain, bilateral--chronic 11/22/2013  . CVA (cerebral infarction) 11/21/2013  . Embolic cerebral infarction   . Unspecified cerebral artery occlusion with cerebral infarction 11/16/2013  . COPD (chronic obstructive pulmonary disease) 08/27/2013  . CAD S/P percutaneous coronary angioplasty 07/26/2013  . HTN (hypertension) 07/26/2013  . Hyperlipemia 07/26/2013  . History of tobacco use in home 07/26/2013  . Atherosclerosis of native arteries of the extremities with intermittent claudication 08/23/2012    End of Session Activity Tolerance: Patient tolerated treatment well General Behavior During Therapy: [facility] for tasks assessed/performed  GO Functional Assessment Tool Used: clinical observation grip L/R 11/75  50% impaired Functional Limitation: Carrying, moving and handling objects Carrying, Moving and Handling Objects Current Status [phone]): At least 40 percent but less than 60 percent impaired, limited or restricted Carrying, Moving and Handling Objects Goal Status [phone]): At least 1 percent but less than 20 percent impaired, limited or restricted  Vangie Bicker, OTR/L  01/15/2014, 3:24 PM  Physician Documentation Your signature is required to indicate approval of the treatment plan as stated above.   Please sign and either send electronically or make a copy of this report for your files and return this physician signed original.  Please mark one 1.__approve of plan  2. ___approve of plan with the following conditions.   ______________________________                                                          _____________________ Physician Signature                                                                                                             Date

## 2014-01-17 ENCOUNTER — Ambulatory Visit (HOSPITAL_COMMUNITY)
Admission: RE | Admit: 2014-01-17 | Discharge: 2014-01-17 | Disposition: A | Discharge status: Home / Self Care | Payer: Medicare Other | Source: Ambulatory Visit | Attending: Specialist | Admitting: Specialist

## 2014-01-17 NOTE — Progress Notes (Signed)
Occupational Therapy Treatment Patient Details  Name: Joshua Horton MRN: [id number] Date of Birth: [birth year]  Today's Date: 01/17/2014 Time: 1425-1510 OT Time Calculation (min): 45 min NM re-ed 1425-1510 68'  Visit#: 55 of 24  Re-eval: 02/12/14    Authorization: [facility] Medicare  Authorization Time Period: before 20th visit  Authorization Visit#: 11 of 20  Subjective Symptoms/Limitations Symptoms: S: I haven't been able to use my left hand to eat. I tried.  Pain Assessment Currently in Pain?: No/denies  Precautions/Restrictions  Precautions Precautions: Fall Precaution Comments: risk for subluxation and injury to left hand due to decr ROM and sensation  Exercise/Treatments Hand Exercises Theraputty - Flatten: Red - standing Theraputty - Roll: Red Theraputty - Grip: Red  Theraputty - Pinch: Red Theraputty - Locate Pegs: Red- 6 beads Thumb Opposition: 10X each finger minus small finger (unable to complete prior to CVA) Digit Abduction/Adduction: 10X with palm on table top Sponges: Utilized black tongs to pick up 15 soft sponges and 15 hard sponges in container. Other Hand Exercises: Fist to extended digits 10X; Hand flat on table; finger taps one at a time 10X;  Other Hand Exercises: Resistive clothespins completed; yellow, red, and green only; all placed/removed with increase time.        Occupational Therapy Assessment and Plan OT Assessment and Plan Clinical Impression Statement: A: Patient given red theraputty, HEP handout (putty and FMC exercises), and built up foam for utensils this date. Patient required verbal cues intially to depress shoulder when completing putty activities.  OT Plan: P: Follow up with HEP and built up foam use during self-feeding. Cont. to work on increasing grip and pinch strength. Velcro numbers on wall, Saebo ball activity.    Goals Short Term Goals Short Term Goal 1: Patient will be educated on HEP Short Term Goal 2: Patient will increase  to use of left hand as an active assist with all daily activities 25% of the time. Short Term Goal 3: Patient will tolerate weightbearing on LUE for 3 minute for increased independence with BADL.  Short Term Goal 4: Patient will demonstrate gross grasp/digit flexion >/= 75% and extension of at least 25% to increase ability to pull pants Short Term Goal 5: Patient will demo opposition to at least digit 2 to increase ability to utilize L hand for clothing fasteners Short Term Goal 6: Patient will complete the Nine Hole Peg Test.  Short Term Goal 7: Patient will  increase left grip strength to 15 pounds and pinch strength to 2 pounds for increased ability to open containers  Long Term Goals Long Term Goal 1: Patient will return to highest level of independence with all leisure and daily activities.  Long Term Goal 2: Patient will  increase left grip strength to >/=35 pounds and pinch strength to 8 pounds for increased ability to open containers Long Term Goal 3: Patient will complete the Nine Hole Peg Test in standardized fashion, and oppose all digits for greater [facility] needed to fasten buttons.  Long Term Goal 4: Patient will have at least 4+/5 strength in proximal LUE for increased ability to pick up tackle box  Long Term Goal 5: Patient will report independent ability to shave with Lt hand   Problem List Patient Active Problem List   Diagnosis Date Noted  . Dysarthria due to cerebrovascular accident 12/03/2013  . Difficulty in walking(719.7) 12/03/2013  . Lack of coordination 12/03/2013  . Hip pain, bilateral--chronic 11/22/2013  . CVA (cerebral infarction) 11/21/2013  . Embolic cerebral  infarction   . Unspecified cerebral artery occlusion with cerebral infarction 11/16/2013  . COPD (chronic obstructive pulmonary disease) 08/27/2013  . CAD S/P percutaneous coronary angioplasty 07/26/2013  . HTN (hypertension) 07/26/2013  . Hyperlipemia 07/26/2013  . History of tobacco use in home 07/26/2013   . Atherosclerosis of native arteries of the extremities with intermittent claudication 08/23/2012    End of Session Activity Tolerance: Patient tolerated treatment well General Behavior During Therapy: [facility] for tasks assessed/performed OT Plan of Care OT Home Exercise Plan: Red theraputty, [facility] activity sheet OT Patient Instructions: handout (scanned) Consulted and Agree with Plan of Care: Patient   Ailene Ravel, OTR/L,CBIS   01/17/2014, 3:20 PM

## 2014-01-22 ENCOUNTER — Encounter: Payer: Self-pay | Admitting: Vascular Surgery

## 2014-01-22 ENCOUNTER — Ambulatory Visit (HOSPITAL_COMMUNITY)
Admission: RE | Admit: 2014-01-22 | Discharge: 2014-01-22 | Disposition: A | Discharge status: Home / Self Care | Payer: Medicare Other | Source: Ambulatory Visit | Attending: Family Medicine | Admitting: Family Medicine

## 2014-01-22 NOTE — Progress Notes (Signed)
Occupational Therapy Treatment Patient Details  Name: Joshua Horton MRN: [id number] Date of Birth: [birth year]  Today's Date: 01/22/2014 Time: 1520-1605 OT Time Calculation (min): 45 min Manual therapy 1520-1535 15' Neuro reed 7622-6333 20' selfcare 5456-2563 10' Visit#: 12 of 24  Re-eval: 02/12/14    Authorization: [facility] Medicare  Authorization Time Period: before 20th visit  Authorization Visit#: 12 of 20  Subjective S:  I can't eat with my left hand, the spoon turns and the food falls off. Pain Assessment Currently in Pain?: No/denies  Exercise/Treatments Wrist Exercises Forearm Supination: AROM;PROM;10 reps Forearm Pronation: PROM;AROM;10 reps Wrist Flexion: PROM;AROM;10 reps Wrist Extension: PROM;AROM;10 reps   Sponges: able to pick up 2 sponges and translate to palm before dropping.  Theraputty - Flatten: Red - standing Theraputty - Roll: Red Theraputty - Grip: Red  forearm supinated and pronated  Hand Exercises Digit Composite Abduction: AROM;5 reps Digit Composite Adduction: AROM;5 reps Opposition: AROM;10 reps (to long digit) Theraputty - Flatten: Red - standing Theraputty - Roll: Red Theraputty - Grip: Red  forearm supinated and pronated Sponges: able to pick up 2 sponges and translate to palm before dropping.  Tendon Glides: 10 times Other Hand Exercises: picked up 20 sponges with tweezers to promote tripod grasp needed on utensils     Manual Therapy Manual Therapy: Myofascial release Myofascial Release: MFR and manual stretching to left upper arm, wrist, and hand with gentle joint mobs at wrist and hand to decrease tightness and edema and improve mobility. Activities of Daily Living Activities of Daily Living: patient used red foam cylinder on spoon for increased grasp on utensil and was able to self feed with minimal difficulty.  Recommended he continue to practice at home.   Occupational Therapy Assessment and Plan OT Assessment and Plan Clinical  Impression Statement: A:  Cuing (verbal and tactile), to increase wrist extension with reachinng and fine motor activities vs using shoulder movements. Reviewed use of foam cylinder on utensils for increased ability to maintain grasp and decrease spillage. OT Plan: P:  Work on handwriting with builtup pen, velcro numbers for reach and fine motor   Goals Short Term Goals Short Term Goal 1: Patient will be educated on HEP Short Term Goal 2: Patient will increase to use of left hand as an active assist with all daily activities 25% of the time. Short Term Goal 3: Patient will tolerate weightbearing on LUE for 3 minute for increased independence with BADL.  Short Term Goal 4: Patient will demonstrate gross grasp/digit flexion >/= 75% and extension of at least 25% to increase ability to pull pants Short Term Goal 5: Patient will demo opposition to at least digit 2 to increase ability to utilize L hand for clothing fasteners Short Term Goal 6: Patient will complete the Nine Hole Peg Test.  Short Term Goal 7: Patient will  increase left grip strength to 15 pounds and pinch strength to 2 pounds for increased ability to open containers  Long Term Goals Long Term Goal 1: Patient will return to highest level of independence with all leisure and daily activities.  Long Term Goal 2: Patient will  increase left grip strength to >/=35 pounds and pinch strength to 8 pounds for increased ability to open containers Long Term Goal 3: Patient will complete the Nine Hole Peg Test in standardized fashion, and oppose all digits for greater [facility] needed to fasten buttons.  Long Term Goal 4: Patient will have at least 4+/5 strength in proximal LUE for increased ability to  pick up tackle box  Long Term Goal 5: Patient will report independent ability to shave with Lt hand   Problem List Patient Active Problem List   Diagnosis Date Noted  . Dysarthria due to cerebrovascular accident 12/03/2013  . Difficulty in  walking(719.7) 12/03/2013  . Lack of coordination 12/03/2013  . Hip pain, bilateral--chronic 11/22/2013  . CVA (cerebral infarction) 11/21/2013  . Embolic cerebral infarction   . Unspecified cerebral artery occlusion with cerebral infarction 11/16/2013  . COPD (chronic obstructive pulmonary disease) 08/27/2013  . CAD S/P percutaneous coronary angioplasty 07/26/2013  . HTN (hypertension) 07/26/2013  . Hyperlipemia 07/26/2013  . History of tobacco use in home 07/26/2013  . Atherosclerosis of native arteries of the extremities with intermittent claudication 08/23/2012    End of Session Activity Tolerance: Patient tolerated treatment well General Behavior During Therapy: [facility] for tasks assessed/performed  [place], OTR/L  01/22/2014, 4:32 PM

## 2014-01-23 ENCOUNTER — Ambulatory Visit (HOSPITAL_COMMUNITY)
Admission: RE | Admit: 2014-01-23 | Discharge: 2014-01-23 | Disposition: A | Discharge status: Home / Self Care | Payer: Medicare Other | Source: Ambulatory Visit | Attending: Vascular Surgery | Admitting: Vascular Surgery

## 2014-01-23 ENCOUNTER — Ambulatory Visit (INDEPENDENT_AMBULATORY_CARE_PROVIDER_SITE_OTHER): Payer: Medicare Other | Admitting: Vascular Surgery

## 2014-01-23 ENCOUNTER — Encounter: Payer: Self-pay | Admitting: Vascular Surgery

## 2014-01-23 VITALS — BP 159/75 | HR 81 | Ht 70.0 in | Wt 213.9 lb

## 2014-01-23 DIAGNOSIS — I739 Peripheral vascular disease, unspecified: Secondary | ICD-10-CM | POA: Insufficient documentation

## 2014-01-23 DIAGNOSIS — I70219 Atherosclerosis of native arteries of extremities with intermittent claudication, unspecified extremity: Secondary | ICD-10-CM | POA: Insufficient documentation

## 2014-01-23 NOTE — Progress Notes (Signed)
[facility] [place]  Patient name: Joshua Horton MRN: [id number] DOB: May 18, 1946 Sex: male  REASON FOR VISIT: Follow up of peripheral vascular disease.  HPI: Joshua Horton is a 68 y.o. male who I last saw on 07/25/2013. He has a known left common iliac artery occlusion based on an arteriogram from 2008. He comes in for a 6 month follow up visit. Since I saw him last, he had a stroke in late December 2014 associated with left-sided weakness. He had a carotid duplex scan during that admission which showed a 40-59% right carotid stenosis with a less than 39% left carotid stenosis.  He states his symptoms in his legs actually improving he can walk further before experiencing calf claudication. He experiences claudication more so on the left side which is brought on by ambulation and relieved with rest. He denies any history of rest pain or nonhealing ulcers.   Past Medical History  Diagnosis Date  . Coronary artery disease   . Wheezing   . Chronic cough   . Peripheral vascular disease   . Hypertension   . Hyperlipidemia   . Reflux   . COPD (chronic obstructive pulmonary disease)   . Myocardial infarction   . CHF (congestive heart failure)   . Embolic cerebral infarction     S/P IV t-PA,  post-tPA hemorrhagic transformation,clinically asymptomatic, scattered right MCA ischemic infarcts, right occipital lobe hemorrhagic infarction with focal hematoma, small volume of right subarachnoid hemorrhage    Family History  Problem Relation Age of Onset  . Heart disease    . Arthritis    . Cancer    . Kidney disease    . Cancer Mother   . Deep vein thrombosis Father   . Heart disease Father   . Hyperlipidemia Father   . Hypertension Father   . Heart attack Father   . Peripheral vascular disease Father    SOCIAL HISTORY: History  Substance Use Topics  . Smoking status: Former Smoker -- 1.00 packs/day    Types: Cigarettes    Quit date: 04/03/2013  .  Smokeless tobacco: Never Used  . Alcohol Use: No   Allergies  Allergen Reactions  . Ivp Dye [Iodinated Diagnostic Agents]    Current Outpatient Prescriptions  Medication Sig Dispense Refill  . amLODipine (NORVASC) 5 MG tablet Take 1 tablet (5 mg total) by mouth daily. For blood pressure  30 tablet  1  . aspirin EC 81 MG EC tablet Take 1 tablet (81 mg total) by mouth daily.      . diclofenac sodium (VOLTAREN) 1 % GEL Apply 2 g topically 3 (three) times daily before meals. To bilateral hips.  4 Tube  1  . furosemide (LASIX) 40 MG tablet Take 1 tablet by mouth daily.      . hydrocerin (EUCERIN) CREA Apply 1 application topically 2 (two) times daily.    0  . hydrocortisone (ANUSOL-HC) 25 MG suppository Place 1 suppository (25 mg total) rectally 2 (two) times daily.  12 suppository  0  . nitroGLYCERIN (NITROSTAT) 0.4 MG SL tablet Place 0.4 mg under the tongue every 5 (five) minutes as needed for chest pain.      Marland Kitchen omeprazole (PRILOSEC) 20 MG capsule Take 20 mg by mouth daily.      . simvastatin (ZOCOR) 40 MG tablet Take 40 mg by mouth every evening.       No current facility-administered medications for this visit.   REVIEW OF SYSTEMS: Valu.Nieves ]  denotes positive finding; [  ] denotes negative finding  CARDIOVASCULAR:  [ ]  chest pain   [ ]  chest pressure   [ ]  palpitations   [ ]  orthopnea   Valu.Nieves ] dyspnea on exertion   Valu.Nieves ] claudication   [ ]  rest pain   Valu.Nieves ] DVT   [ ]  phlebitis PULMONARY:   [ ]  productive cough   [ ]  asthma   [ ]  wheezing NEUROLOGIC:   Valu.Nieves ] weakness  [ ]  paresthesias  [ ]  aphasia  [ ]  amaurosis  [ ]  dizziness HEMATOLOGIC:   [ ]  bleeding problems   [ ]  clotting disorders MUSCULOSKELETAL:  [ ]  joint pain   [ ]  joint swelling [ ]  leg swelling GASTROINTESTINAL: [ ]   blood in stool  [ ]   hematemesis GENITOURINARY:  [ ]   dysuria  [ ]   hematuria PSYCHIATRIC:  [ ]  history of major depression INTEGUMENTARY:  [ ]  rashes  [ ]  ulcers CONSTITUTIONAL:  [ ]  fever   [ ]  chills  PHYSICAL  EXAM: Filed Vitals:   01/23/14 1038  BP: 159/75  Pulse: 81  Height: 5\' 10"  (1.778 m)  Weight: 213 lb 14.4 oz (97.024 kg)  SpO2: 96%   Body mass index is 30.69 kg/(m^2). GENERAL: The patient is a well-nourished male, in no acute distress. The vital signs are documented above. CARDIOVASCULAR: There is a regular rate and rhythm. I do not detect carotid bruits. He has a right femoral pulse. I cannot palpate a left femoral pulse. I cannot palpate pedal pulses. Both feet are warm and well-perfused. He has mild bilateral lower extremity swelling. PULMONARY: There is good air exchange bilaterally without wheezing or rales. ABDOMEN: Soft and non-tender with normal pitched bowel sounds.  MUSCULOSKELETAL: There are no major deformities or cyanosis. NEUROLOGIC: his left grip is weak. SKIN: There are no ulcers or rashes noted. PSYCHIATRIC: The patient has a normal affect.  DATA:  I have independently interpreted his arterial Doppler study which shows a biphasic posterior tibial signal on the right with a monophasic dorsalis pedis signal. ABI on the right is 78%. Toe pressure on the right is 53 mmHg. On the left side, he has a monophasic posterior tibial signal and a monophasic dorsalis pedis signal. ABI on the left is 64%. Toe pressure on the left is 38 mm of mercury.  I have compared this study to his previous study in September 2014. At that time ABI on the right was 59% so today's IABI has improved. On the left side, ABI was 57% so this has also improved slightly.  MEDICAL ISSUES:  PVD (peripheral vascular disease) His bilateral lower extremity claudication symptoms have actually improved. His ABIs are improved slightly bilaterally. He has a known left common iliac artery occlusion. Fortunately he quit smoking in May of 2014. I've encouraged him to stay as active as possible. I've ordered follow up ABIs in one year and I'll see him back at that time. He knows to call sooner if he has problems. He is  on aspirin.   He has a 40-59% right carotid stenosis I have ordered a fall carotid duplex scan we comes in in one year for his follow up visit.   Return in about 1 year (around 01/24/2015).  [place] [facility] [place] Beeper: [phone]

## 2014-01-23 NOTE — Addendum Note (Signed)
Addended by: Dorthula Rue L on: 01/23/2014 01:47 PM   Modules accepted: Orders

## 2014-01-23 NOTE — Assessment & Plan Note (Signed)
His bilateral lower extremity claudication symptoms have actually improved. His ABIs are improved slightly bilaterally. He has a known left common iliac artery occlusion. Fortunately he quit smoking in May of 2014. I've encouraged him to stay as active as possible. I've ordered follow up ABIs in one year and I'll see him back at that time. He knows to call sooner if he has problems. He is on aspirin.

## 2014-01-24 ENCOUNTER — Ambulatory Visit (HOSPITAL_COMMUNITY)
Admission: RE | Admit: 2014-01-24 | Discharge: 2014-01-24 | Disposition: A | Discharge status: Home / Self Care | Payer: Medicare Other | Source: Ambulatory Visit | Attending: Family Medicine | Admitting: Family Medicine

## 2014-01-24 NOTE — Progress Notes (Signed)
Occupational Therapy Treatment Patient Details  Name: Joshua Horton MRN: [id number] Date of Birth: 1945/12/17  Today's Date: 01/24/2014 Time: 6606-3016 OT Time Calculation (min): 45 min Manual therapy 0109-3235 15' Neuro reeducation 1445-1515 30'  Visit#: 13 of 24  Re-eval: 02/12/14    Authorization: [facility] Medicare  Authorization Time Period: before 20th visit  Authorization Visit#: 13 of 20  Subjective S:  I tried eating with the spoon, it was ok.  I couldnt do it with the fork, my forks are heavier than yours.  Precautions/Restrictions     Exercise/Treatments Wrist Exercises Forearm Supination: PROM;AROM;10 reps Forearm Pronation: PROM;AROM;10 reps Wrist Flexion: PROM;AROM;10 reps Wrist Extension: PROM;AROM;10 reps Wrist Radial Deviation: PROM;AROM;10 reps Other wrist exercises: proximal wrist stabiilty with white PVC pipe  3 times   Theraputty - Flatten: Red - standing Theraputty - Roll: Red Theraputty - Grip: Red  forearm supinated and pronated Hand Gripper with Large Beads: black hand gripper with white spring with 11# resisitance and picked up 15 blocks   Hand Exercises Digit Composite Abduction: AROM;5 reps Digit Composite Adduction: AROM;5 reps Theraputty - Flatten: Red - standing Theraputty - Roll: Red Theraputty - Grip: Red  forearm supinated and pronated Hand Gripper with Large Beads: black hand gripper with white spring with 11# resisitance and picked up 15 blocks Other Hand Exercises: gross grasp and release 10 times     Manual Therapy Manual Therapy: Myofascial release Myofascial Release: MFR and manual stretching to left upper arm, wrist, and hand with gentle joint mobs at wrist and hand to decrease tightness and edema and improve mobility.  Occupational Therapy Assessment and Plan OT Assessment and Plan Clinical Impression Statement: A:  Able to pick up blocks with black hand gripper with 11# resistance with mod difficulty OT Plan: P:  Work on  handwriting with builtup pen, velcro numbers for reach and fine motor   Goals Short Term Goals Short Term Goal 1: Patient will be educated on HEP Short Term Goal 2: Patient will increase to use of left hand as an active assist with all daily activities 25% of the time. Short Term Goal 3: Patient will tolerate weightbearing on LUE for 3 minute for increased independence with BADL.  Short Term Goal 4: Patient will demonstrate gross grasp/digit flexion >/= 75% and extension of at least 25% to increase ability to pull pants Short Term Goal 5: Patient will demo opposition to at least digit 2 to increase ability to utilize L hand for clothing fasteners Short Term Goal 6: Patient will complete the Nine Hole Peg Test.  Short Term Goal 7: Patient will  increase left grip strength to 15 pounds and pinch strength to 2 pounds for increased ability to open containers  Short Term Goal 7 Progress: Progressing toward goal Long Term Goals Long Term Goal 1: Patient will return to highest level of independence with all leisure and daily activities.  Long Term Goal 1 Progress: Progressing toward goal Long Term Goal 2: Patient will  increase left grip strength to >/=35 pounds and pinch strength to 8 pounds for increased ability to open containers Long Term Goal 2 Progress: Progressing toward goal Long Term Goal 3: Patient will complete the Nine Hole Peg Test in standardized fashion, and oppose all digits for greater [facility] needed to fasten buttons.  Long Term Goal 3 Progress: Progressing toward goal Long Term Goal 4: Patient will have at least 4+/5 strength in proximal LUE for increased ability to pick up tackle box  Long Term Goal  4 Progress: Progressing toward goal Long Term Goal 5: Patient will report independent ability to shave with Lt hand  Long Term Goal 5 Progress: Progressing toward goal  Problem List Patient Active Problem List   Diagnosis Date Noted  . PVD (peripheral vascular disease) 01/23/2014  .  Dysarthria due to cerebrovascular accident 12/03/2013  . Difficulty in walking(719.7) 12/03/2013  . Lack of coordination 12/03/2013  . Hip pain, bilateral--chronic 11/22/2013  . CVA (cerebral infarction) 11/21/2013  . Embolic cerebral infarction   . Unspecified cerebral artery occlusion with cerebral infarction 11/16/2013  . COPD (chronic obstructive pulmonary disease) 08/27/2013  . CAD S/P percutaneous coronary angioplasty 07/26/2013  . HTN (hypertension) 07/26/2013  . Hyperlipemia 07/26/2013  . History of tobacco use in home 07/26/2013    End of Session Activity Tolerance: Patient tolerated treatment well General Behavior During Therapy: [facility] for tasks assessed/performed  [place], OTR/L  01/24/2014, 4:32 PM

## 2014-01-29 ENCOUNTER — Ambulatory Visit (HOSPITAL_COMMUNITY)
Admission: RE | Admit: 2014-01-29 | Discharge: 2014-01-29 | Disposition: A | Discharge status: Home / Self Care | Payer: Medicare Other | Source: Ambulatory Visit | Attending: Family Medicine | Admitting: Family Medicine

## 2014-01-29 NOTE — Progress Notes (Signed)
Occupational Therapy Treatment Patient Details  Name: Joshua Horton MRN: [id number] Date of Birth: 12-18-1945  Today's Date: 01/29/2014 Time: 9381-8299 OT Time Calculation (min): 39 min Edema massage [phone] 10' NM re-ed 9381-0175 29'  Visit#: 49 of 24  Re-eval: 02/12/14    Authorization: [employer]  Authorization Time Period: before 20th visit  Authorization Visit#: 14 of 20  Subjective Symptoms/Limitations Symptoms: S: My hand is still swollen. Pain Assessment Currently in Pain?: No/denies  Precautions/Restrictions  Precautions Precautions: Fall Precaution Comments: risk for subluxation and injury to left hand due to decr ROM and sensation  Exercise/Treatments Wrist Exercises Wrist Flexion: PROM;AROM;10 reps Wrist Extension: PROM;AROM;10 reps Wrist Radial Deviation: PROM;AROM;10 reps Other wrist exercises: velcro board with dowl #7 up/down in vertical position to increase wrist flexion and extension.     Hand Exercises Digit Composite Abduction: AROM;10 reps Digit Composite Adduction: AROM;10 reps Other Hand Exercises: Card flipping with left hand; 20X with max difficulty.  Other Hand Exercises: Picking up coins from table top and placed in penny bank. Max difficulty picking up coins unless sliding coins to the edge.   Neurological Re-education Exercises Wrist Flexion: PROM;AROM;10 reps Wrist Extension: PROM;AROM;10 reps Wrist Radial Deviation: PROM;AROM;10 reps    Manual Therapy Manual Therapy: Edema management Edema Management: Edema massage and manual stretching to left wrist and hand to decrease edema and improve mobility.  Activities of Daily Living Activities of Daily Living: Patient used blue foam cylinder on marker to practice writing name. Patient wrote name twice with increased time and max difficulty. patient used towel under elbow as he was unable to perform required wrist movement and relied on shoulder movement.   Occupational Therapy  Assessment and Plan OT Assessment and Plan Clinical Impression Statement: A: Patient had difficulty completing required wrist movment during activities. Required vc's to try and relax shoulder.  OT Plan: P: Cont to work on handwriting with shoulder relax and completing with fluid wrist movement. Velcro numbers for reach and fine motor.    Goals Short Term Goals Short Term Goal 1: Patient will be educated on HEP Short Term Goal 2: Patient will increase to use of left hand as an active assist with all daily activities 25% of the time. Short Term Goal 3: Patient will tolerate weightbearing on LUE for 3 minute for increased independence with BADL.  Short Term Goal 4: Patient will demonstrate gross grasp/digit flexion >/= 75% and extension of at least 25% to increase ability to pull pants Short Term Goal 4 Progress: Progressing toward goal Short Term Goal 5: Patient will demo opposition to at least digit 2 to increase ability to utilize L hand for clothing fasteners Short Term Goal 6: Patient will complete the Nine Hole Peg Test.  Short Term Goal 7: Patient will  increase left grip strength to 15 pounds and pinch strength to 2 pounds for increased ability to open containers  Short Term Goal 7 Progress: Progressing toward goal Long Term Goals Long Term Goal 1: Patient will return to highest level of independence with all leisure and daily activities.  Long Term Goal 1 Progress: Progressing toward goal Long Term Goal 2: Patient will  increase left grip strength to >/=35 pounds and pinch strength to 8 pounds for increased ability to open containers Long Term Goal 2 Progress: Progressing toward goal Long Term Goal 3: Patient will complete the Nine Hole Peg Test in standardized fashion, and oppose all digits for greater [facility] needed to fasten buttons.  Long Term Goal 3 Progress:  Progressing toward goal Long Term Goal 4: Patient will have at least 4+/5 strength in proximal LUE for increased ability to pick  up tackle box  Long Term Goal 4 Progress: Progressing toward goal Long Term Goal 5: Patient will report independent ability to shave with Lt hand  Long Term Goal 5 Progress: Progressing toward goal  Problem List Patient Active Problem List   Diagnosis Date Noted  . PVD (peripheral vascular disease) 01/23/2014  . Dysarthria due to cerebrovascular accident 12/03/2013  . Difficulty in walking(719.7) 12/03/2013  . Lack of coordination 12/03/2013  . Hip pain, bilateral--chronic 11/22/2013  . CVA (cerebral infarction) 11/21/2013  . Embolic cerebral infarction   . Unspecified cerebral artery occlusion with cerebral infarction 11/16/2013  . COPD (chronic obstructive pulmonary disease) 08/27/2013  . CAD S/P percutaneous coronary angioplasty 07/26/2013  . HTN (hypertension) 07/26/2013  . Hyperlipemia 07/26/2013  . History of tobacco use in home 07/26/2013    End of Session Activity Tolerance: Patient tolerated treatment well General Behavior During Therapy: [facility] for tasks assessed/performed   Ailene Ravel, OTR/L,CBIS   01/29/2014, 11:57 AM

## 2014-01-31 ENCOUNTER — Ambulatory Visit (HOSPITAL_COMMUNITY)
Admission: RE | Admit: 2014-01-31 | Discharge: 2014-01-31 | Disposition: A | Discharge status: Home / Self Care | Payer: Medicare Other | Source: Ambulatory Visit | Attending: Family Medicine | Admitting: Family Medicine

## 2014-01-31 NOTE — Progress Notes (Signed)
Occupational Therapy Treatment Patient Details  Name: Joshua Horton MRN: [id number] Date of Birth: 27-Dec-1945  Today's Date: 01/31/2014 Time: 2440-1027 OT Time Calculation (min): 42 min Manual therapy [phone] 15' Therapeutic exercises 3474-2595 15' Neuro reed 1333 - 1345  12'  Visit#: 15 of 24  Re-eval: 02/12/14    Authorization: [employer]  Authorization Time Period: before 20th visit  Authorization Visit#: 15 of 20  Subjective Symptoms/Limitations Symptoms: S:  I think I over did it this week.  I am tearing down a building.  Precautions/Restrictions     Exercise/Treatments Wrist Exercises Forearm Supination: Strengthening;10 reps (3 pounds) Forearm Pronation: Strengthening;10 reps (3 pounds) Wrist Flexion: Strengthening;10 reps (3 pounds) Wrist Extension: Strengthening;10 reps (3 pounds) Wrist Radial Deviation: PROM;Strengthening;10 reps (3 pounds)   Hand Gripper with Large Beads: black hand gripper with white spring with 11# resisitance and picked up 15 blocks  then increased to 23# of resistance and able to pick up 10 additional blocks.   Hand Exercises Joint Blocking Exercises: PROM  X 5  Thumb Opposition: to ring digit X 2 Hand Gripper with Large Beads: black hand gripper with white spring with 11# resisitance and picked up 15 blocks  then increased to 23# of resistance and able to pick up 10 additional blocks. Fine Motor Coordination: In hand manipuation training In Hand Manipulation Training: picking up small bullet shells and placing into container board.  Patient had max difficulty not abducting shoulder and focusing on wrist extension and in hand manipulation, however he was able to do so with mod vg and tactile cues   Neurological Re-education Exercises Forearm Supination: Strengthening;10 reps (3 pounds) Forearm Pronation: Strengthening;10 reps (3 pounds) Wrist Flexion: Strengthening;10 reps (3 pounds) Wrist Extension: Strengthening;10 reps (3  pounds) Wrist Radial Deviation: PROM;Strengthening;10 reps (3 pounds) Hand Gripper with Large Beads: black hand gripper with white spring with 11# resisitance and picked up 15 blocks  then increased to 23# of resistance and able to pick up 10 additional blocks.  Development of Reach  Development of Reach: Reaching Reaching to Shoulder Height: using pinch tree, moving yellow, red, and green clothespins from first rung to the third rung with mod vg and min tactile cues to avoid shoulder abduction and increase ability to extend wrist while reaching   Grasp and Release Thumb Opposition: to ring digit X 2  Fine Motor Coordination Fine Motor Coordination: In hand manipuation training In Hand Manipulation Training: picking up small bullet shells and placing into container board.  Patient had max difficulty not abducting shoulder and focusing on wrist extension and in hand manipulation, however he was able to do so with mod vg and tactile cues      Manual Therapy Manual Therapy: Myofascial release Myofascial Release: MFR and manual stretching to left upper arm, wrist, and hand with gentle joint mobs at wrist and hand to decrease tightness and edema and improve mobility. Activities of Daily Living Activities of Daily Living: attempted using a tennis ball on pencil to write for increased grasp on tennis ball.  Did not work as the hole in the tennis ball was too large, will obtain an additional tennis ball and attempt again at next session.   Occupational Therapy Assessment and Plan OT Assessment and Plan Clinical Impression Statement: A:  Difficulty extending wrist while manipulating items in his hand, isolated wrist extension is not as difficulty OT Plan: P:  Use tennis ball as an aid with handwriting.    Goals Short Term Goals Short Term Goal  1: Patient will be educated on HEP Short Term Goal 2: Patient will increase to use of left hand as an active assist with all daily activities 25% of the  time. Short Term Goal 3: Patient will tolerate weightbearing on LUE for 3 minute for increased independence with BADL.  Short Term Goal 4: Patient will demonstrate gross grasp/digit flexion >/= 75% and extension of at least 25% to increase ability to pull pants Short Term Goal 5: Patient will demo opposition to at least digit 2 to increase ability to utilize L hand for clothing fasteners Short Term Goal 6: Patient will complete the Nine Hole Peg Test.  Short Term Goal 7: Patient will  increase left grip strength to 15 pounds and pinch strength to 2 pounds for increased ability to open containers  Long Term Goals Long Term Goal 1: Patient will return to highest level of independence with all leisure and daily activities.  Long Term Goal 2: Patient will  increase left grip strength to >/=35 pounds and pinch strength to 8 pounds for increased ability to open containers Long Term Goal 3: Patient will complete the Nine Hole Peg Test in standardized fashion, and oppose all digits for greater [facility] needed to fasten buttons.  Long Term Goal 4: Patient will have at least 4+/5 strength in proximal LUE for increased ability to pick up tackle box  Long Term Goal 5: Patient will report independent ability to shave with Lt hand   Problem List Patient Active Problem List   Diagnosis Date Noted  . PVD (peripheral vascular disease) 01/23/2014  . Dysarthria due to cerebrovascular accident 12/03/2013  . Difficulty in walking(719.7) 12/03/2013  . Lack of coordination 12/03/2013  . Hip pain, bilateral--chronic 11/22/2013  . CVA (cerebral infarction) 11/21/2013  . Embolic cerebral infarction   . Unspecified cerebral artery occlusion with cerebral infarction 11/16/2013  . COPD (chronic obstructive pulmonary disease) 08/27/2013  . CAD S/P percutaneous coronary angioplasty 07/26/2013  . HTN (hypertension) 07/26/2013  . Hyperlipemia 07/26/2013  . History of tobacco use in home 07/26/2013    End of  Session Activity Tolerance: Patient tolerated treatment well General Behavior During Therapy: [facility] for tasks assessed/performed  [place], OTR/L  01/31/2014, 2:32 PM

## 2014-02-05 ENCOUNTER — Ambulatory Visit (HOSPITAL_COMMUNITY)
Admission: RE | Admit: 2014-02-05 | Discharge: 2014-02-05 | Disposition: A | Discharge status: Home / Self Care | Payer: Medicare Other | Source: Ambulatory Visit | Attending: Family Medicine | Admitting: Family Medicine

## 2014-02-05 ENCOUNTER — Ambulatory Visit (INDEPENDENT_AMBULATORY_CARE_PROVIDER_SITE_OTHER): Payer: Medicare Other | Admitting: Neurology

## 2014-02-05 ENCOUNTER — Encounter: Payer: Self-pay | Admitting: Neurology

## 2014-02-05 VITALS — BP 134/71 | HR 80 | Ht 70.0 in | Wt 212.0 lb

## 2014-02-05 DIAGNOSIS — R0609 Other forms of dyspnea: Secondary | ICD-10-CM

## 2014-02-05 DIAGNOSIS — R0683 Snoring: Secondary | ICD-10-CM

## 2014-02-05 DIAGNOSIS — R0989 Other specified symptoms and signs involving the circulatory and respiratory systems: Secondary | ICD-10-CM

## 2014-02-05 DIAGNOSIS — G4719 Other hypersomnia: Secondary | ICD-10-CM

## 2014-02-05 DIAGNOSIS — G4710 Hypersomnia, unspecified: Secondary | ICD-10-CM

## 2014-02-05 NOTE — Progress Notes (Signed)
[facility] [address] [place]. [place] [address] [phone]       OFFICE FOLLOW-UP NOTE  [name] Date of Birth:  30-Apr-1946 Medical Record Number:  [id number]   HPI: Mr Joshua Horton is a 85 year Caucasian male seen for first office follow for the following hospital admission for stroke and 11/16/2013. He developed sudden onset of speech difficulty and left hand weakness. He presented as a code stroke with initial NIH stroke scale of 5. This hematoma was felt to clinically asymptomatic. Transthoracic echo as well as TEE did not reveal a cardiac source of embolism. Patient had a loop recorder implanted  to look for paroxysmal atrial fibrillation. He was on aspirin prior to admission which was temporarily held because of the hematoma but has since been restarted. LDL cholesterol was 95 mg percent and he was continued on Zocor. Hemoglobin A1c was 6.5%. Carotid Doppler showed 40-9% right testis stenosis. He was advised to quit smoking. Patient was transferred to inpatient rehabilitation to recover from his left hemiparesis. Patient's case is done well since discharge history she is improved left hand weakness persists. His doing outpatient physical and occupational therapy. His loop recorder so far not revealing atrial fibrillation. The patient's wife states that his nose, he has just overnight sleep, he has excessive daytime sleepiness and she has occasional episodes of witnessed sleep apnea. Patient states is tolerating aspirin well without bruising. His blood pressure is well-controlled 134571 today. He has not been a little for sleep apnea yet ROS:   14 system review of systems is positive for easy bruising, feeling cold, joint swelling, numbness, weakness, excessive daytime sleepiness, disturbed sleep, snoring and all the systems negative  PMH:  Past Medical History  Diagnosis Date  . Coronary artery disease   . Wheezing   . Chronic cough   . Peripheral vascular  disease   . Hypertension   . Hyperlipidemia   . Reflux   . COPD (chronic obstructive pulmonary disease)   . Myocardial infarction   . CHF (congestive heart failure)   . Embolic cerebral infarction     S/P IV t-PA,  post-tPA hemorrhagic transformation,clinically asymptomatic, scattered right MCA ischemic infarcts, right occipital lobe hemorrhagic infarction with focal hematoma, small volume of right subarachnoid hemorrhage     Social History:  History   Social History  . Marital Status: Married    Spouse Name: tessie    Number of Children: 2  . Years of Education: 10th   Occupational History  . RETIRED    Social History Main Topics  . Smoking status: Former Smoker -- 1.00 packs/day    Types: Cigarettes    Quit date: 04/03/2013  . Smokeless tobacco: Never Used  . Alcohol Use: No  . Drug Use: No  . Sexual Activity: Not on file   Other Topics Concern  . Not on file   Social History Narrative   Patient lives  At home with his wife... Patient drinks caffinated drinks.    Medications:   Current Outpatient Prescriptions on File Prior to Visit  Medication Sig Dispense Refill  . aspirin EC 81 MG EC tablet Take 1 tablet (81 mg total) by mouth daily.      . hydrocerin (EUCERIN) CREA Apply 1 application topically 2 (two) times daily.    0  . nitroGLYCERIN (NITROSTAT) 0.4 MG SL tablet Place 0.4 mg under the tongue every 5 (five) minutes as needed for chest pain.      Marland Kitchen omeprazole (PRILOSEC)  20 MG capsule Take 20 mg by mouth daily.      . simvastatin (ZOCOR) 40 MG tablet Take 40 mg by mouth every evening.       No current facility-administered medications on file prior to visit.    Allergies:   Allergies  Allergen Reactions  . Ivp Dye [Iodinated Diagnostic Agents]     Physical Exam General:  Obese middle aged Caucasian male, seated, in no evident distress Head: head normocephalic and atraumatic. Orohparynx benign Neck: supple with no carotid or supraclavicular  bruits Cardiovascular: regular rate and rhythm, no murmurs Musculoskeletal: no deformity Skin:  no rash/petichiae Vascular:  Normal pulses all extremities Filed Vitals:   02/05/14 1504  BP: 134/71  Pulse: 80   Neurologic Exam Mental Status: Awake and fully alert. Oriented to place and time. Recent and remote memory intact. Attention span, concentration and fund of knowledge appropriate. Mood and affect appropriate.  Cranial Nerves: Fundoscopic exam reveals sharp disc margins. Pupils equal, briskly reactive to light. Extraocular movements full without nystagmus. Visual fields full to confrontation. Hearing intact. Facial sensation intact. Face, tongue, palate moves normally and symmetrically.  Motor: Normal bulk and tone. Normal strength in all tested extremity muscles. Left hand weakness diminished fine finger movements. Orbits right over left upper extremity. Sensory.: intact to touch and pinprick and vibratory sensation.  Coordination: Rapid alternating movements normal in all extremities. Finger-to-nose and heel-to-shin performed accurately bilaterally. Gait and Station: Arises from chair without difficulty. Stance is normal. Gait demonstrates normal stride length and balance . unable to heel, toe and tandem walk without difficulty. Drags left leg was Reflexes: 1+ and symmetric. Toes downgoing.   NIHSS  0 Modified Rankin  1   ASSESSMENT: 63 year Caucasian male with embolic right MCA infarct of unidentified source in January 2015 treated with IV t-PA with post TPA right occipital focal hematoma which was clinically silent with multiple vascular risk factors of hypertension, hyperlipidemia, diabetes, smoking, and carotid stenosis.    PLAN:  had a long discussion with the patient and his wife regarding his recent admission for stroke, discuss results of hospital evaluation, stroke risk factors and risk factor modification and answered questions. Continue aspirin for stroke prevention  with strict control of hypertension with blood pressure goal below 130/90 and lipids with LDL cholesterol goal below 130 mg percent. Continue outpatient occupational therapy. Polysomnogram for sleep apnea. Return for followup in 3 months with Alphonsus Sias, NP or call earlier if necessary.    Note: This document was prepared with digital dictation and possible smart phrase technology. Any transcriptional errors that result from this process are unintentional

## 2014-02-05 NOTE — Progress Notes (Signed)
Occupational Therapy Treatment Patient Details  Name: Joshua Horton MRN: [id number] Date of Birth: 1946-01-03  Today's Date: 02/05/2014 Time: 8676-1950 OT Time Calculation (min): 48 min Manual therapy [phone] 20' Neuro reeducation [phone] 55' Visit#: 16 of 24  Re-eval: 02/12/14    Authorization: [facility] Medicare  Authorization Time Period: before 20th visit  Authorization Visit#: 16 of 20  Subjective  S:  I go to the neurologist today.   Exercise/Treatments Wrist Exercises Forearm Supination: Strengthening;10 reps (3 pounds) Forearm Pronation: Strengthening;10 reps (3 pounds) Wrist Flexion: Strengthening;10 reps (3 pounds) Wrist Extension: Strengthening;10 reps (3 pounds)   Hand Exercises Fine Motor Coordination: In hand manipuation training In Hand Manipulation Training: picked up pennies from table and placed in vertical bank with min difficulty and increased time to complete task. Other Hand Exercises: blue hand gripper with one max resist blue rubberband, patient required assistance to position his hand on the gripper and then was able to grasp and release independently with min difficulty Other Hand Exercises: fine motor coordination - screwed and unscrewed 1/2" nut and bolt, holding the bolt with his left hand and manipulating the nut with his right hand with moderate difficulty, then unscrewed a 1/4 inch nut and 2 bolts and placed the bolt back with max difficulty.  Vg to avoid compensatory spinning of the bolts and vg to slide the bolt to edge of table to pick up as he was having difficulty picking up the bolt off of the table      Manual Therapy Manual Therapy: Myofascial release Myofascial Release: MFR and manual stretching to left upper arm, wrist, and hand with gentle joint mobs at wrist and hand to decrease tightness and edema and improve mobility.  Occupational Therapy Assessment and Plan OT Assessment and Plan Clinical Impression Statement: A:  patient had  difficulty maintaining grasp on 3# weight.  Able to place and remove  nut on a 1/2 inch bolt with minimal difficulty, and 1/4 inch with mod difficulty OT Plan: P:  Increase ability to maintain grasp on household items by 50%.   Goals Short Term Goals Short Term Goal 1: Patient will be educated on HEP Short Term Goal 2: Patient will increase to use of left hand as an active assist with all daily activities 25% of the time. Short Term Goal 3: Patient will tolerate weightbearing on LUE for 3 minute for increased independence with BADL.  Short Term Goal 4: Patient will demonstrate gross grasp/digit flexion >/= 75% and extension of at least 25% to increase ability to pull pants Short Term Goal 4 Progress: Progressing toward goal Short Term Goal 5: Patient will demo opposition to at least digit 2 to increase ability to utilize L hand for clothing fasteners Short Term Goal 6: Patient will complete the Nine Hole Peg Test.  Short Term Goal 7: Patient will  increase left grip strength to 15 pounds and pinch strength to 2 pounds for increased ability to open containers  Short Term Goal 7 Progress: Progressing toward goal Long Term Goals Long Term Goal 1: Patient will return to highest level of independence with all leisure and daily activities.  Long Term Goal 1 Progress: Progressing toward goal Long Term Goal 2: Patient will  increase left grip strength to >/=35 pounds and pinch strength to 8 pounds for increased ability to open containers Long Term Goal 2 Progress: Progressing toward goal Long Term Goal 3: Patient will complete the Nine Hole Peg Test in standardized fashion, and oppose all digits for  greater [place] needed to fasten buttons.  Long Term Goal 3 Progress: Progressing toward goal Long Term Goal 4: Patient will have at least 4+/5 strength in proximal LUE for increased ability to pick up tackle box  Long Term Goal 4 Progress: Progressing toward goal Long Term Goal 5: Patient will report  independent ability to shave with Lt hand  Long Term Goal 5 Progress: Progressing toward goal  Problem List Patient Active Problem List   Diagnosis Date Noted  . PVD (peripheral vascular disease) 01/23/2014  . Dysarthria due to cerebrovascular accident 12/03/2013  . Difficulty in walking(719.7) 12/03/2013  . Lack of coordination 12/03/2013  . Hip pain, bilateral--chronic 11/22/2013  . CVA (cerebral infarction) 11/21/2013  . Embolic cerebral infarction   . Unspecified cerebral artery occlusion with cerebral infarction 11/16/2013  . COPD (chronic obstructive pulmonary disease) 08/27/2013  . CAD S/P percutaneous coronary angioplasty 07/26/2013  . HTN (hypertension) 07/26/2013  . Hyperlipemia 07/26/2013  . History of tobacco use in home 07/26/2013    End of Session Activity Tolerance: Patient tolerated treatment well General Behavior During Therapy: [facility] for tasks assessed/performed  [place], OTR/L  02/05/2014, 1:30 PM

## 2014-02-05 NOTE — Patient Instructions (Signed)
I had a long discussion with the patient and his wife regarding his recent admission for stroke, discuss results of hospital evaluation, stroke risk factors and risk factor modification and answered questions. Continue aspirin for stroke prevention with strict control of hypertension with blood pressure goal below 130/90 and lipids with LDL cholesterol goal below 130 mg percent. Continue outpatient occupational therapy. Polysomnogram for sleep apnea. Return for followup in 3 months with Alphonsus Sias, NP or call earlier if necessary.  Stroke Prevention Some medical conditions and behaviors are associated with an increased chance of having a stroke. You may prevent a stroke by making healthy choices and managing medical conditions. HOW CAN I REDUCE MY RISK OF HAVING A STROKE?   Stay physically active. Get at least 30 minutes of activity on most or all days.  Do not smoke. It may also be helpful to avoid exposure to secondhand smoke.  Limit alcohol use. Moderate alcohol use is considered to be:  No more than 2 drinks per day for men.  No more than 1 drink per day for nonpregnant women.  Eat healthy foods. This involves  Eating 5 or more servings of fruits and vegetables a day.  Following a diet that addresses high blood pressure (hypertension), high cholesterol, diabetes, or obesity.  Manage your cholesterol levels.  A diet low in saturated fat, trans fat, and cholesterol and high in fiber may control cholesterol levels.  Take any prescribed medicines to control cholesterol as directed by your health care provider.  Manage your diabetes.  A controlled-carbohydrate, controlled-sugar diet is recommended to manage diabetes.  Take any prescribed medicines to control diabetes as directed by your health care provider.  Control your hypertension.  A low-salt (sodium), low-saturated fat, low-trans fat, and low-cholesterol diet is recommended to manage hypertension.  Take any prescribed  medicines to control hypertension as directed by your health care provider.  Maintain a healthy weight.  A reduced-calorie, low-sodium, low-saturated fat, low-trans fat, low-cholesterol diet is recommended to manage weight.  Stop drug abuse.  Avoid taking birth control pills.  Talk to your health care provider about the risks of taking birth control pills if you are over 83 years old, smoke, get migraines, or have ever had a blood clot.  Get evaluated for sleep disorders (sleep apnea).  Talk to your health care provider about getting a sleep evaluation if you snore a lot or have excessive sleepiness.  Take medicines as directed by your health care provider.  For some people, aspirin or blood thinners (anticoagulants) are helpful in reducing the risk of forming abnormal blood clots that can lead to stroke. If you have the irregular heart rhythm of atrial fibrillation, you should be on a blood thinner unless there is a good reason you cannot take them.  Understand all your medicine instructions.  Make sure that other other conditions (such as anemia or atherosclerosis) are addressed. SEEK IMMEDIATE MEDICAL CARE IF:   You have sudden weakness or numbness of the face, arm, or leg, especially on one side of the body.  Your face or eyelid droops to one side.  You have sudden confusion.  You have trouble speaking (aphasia) or understanding.  You have sudden trouble seeing in one or both eyes.  You have sudden trouble walking.  You have dizziness.  You have a loss of balance or coordination.  You have a sudden, severe headache with no known cause.  You have new chest pain or an irregular heartbeat. Any of these symptoms may  represent a serious problem that is an emergency. Do not wait to see if the symptoms will go away. Get medical help at once. Call your local emergency services  (911 in [place] Do not drive yourself to the hospital. Document Released: 12/09/2004 Document  Revised: 08/22/2013 Document Reviewed: 05/04/2013 [facility] Patient Information 2014 [place].

## 2014-02-07 ENCOUNTER — Ambulatory Visit (HOSPITAL_COMMUNITY)
Admission: RE | Admit: 2014-02-07 | Discharge: 2014-02-07 | Disposition: A | Discharge status: Home / Self Care | Payer: Medicare Other | Source: Ambulatory Visit | Attending: Family Medicine | Admitting: Family Medicine

## 2014-02-07 NOTE — Progress Notes (Signed)
Occupational Therapy Treatment Patient Details  Name: Joshua Horton MRN: [id number] Date of Birth: 1946-03-18  Today's Date: 02/07/2014 Time: 6384-6659 OT Time Calculation (min): 40 min Manual 1435-1445 (10') Neuor Re-Ed 9357-0177 (30')  Visit#: 17 of 24  Re-eval: 02/12/14    Authorization: [facility] Medicare  Authorization Time Period: before 20th visit  Authorization Visit#: 17 of 20  Subjective Symptoms/Limitations Symptoms: "he said everything's good. He's letting me drive. It feels good." Pain Assessment Currently in Pain?: Yes Pain Score: 3  Pain Location: Hand Pain Orientation: Left  Precautions/Restrictions     Exercise/Treatments Neurological Re-education Exercises Forearm Supination: Strengthening;10 reps (3#) Forearm Pronation: Strengthening;10 reps (3#) Wrist Flexion: Strengthening;10 reps (3#) Wrist Extension: Strengthening;10 reps (3#) Wrist Radial Deviation: Strengthening;10 reps (3#)     Grasp and Release Thumb Opposition: Grasped flatware from table top and in-hand manipulated to turn in appropriate direction and place into sections of flatware tray. Min difficulty and extra time.  Fine Motor Coordination Fine Motor Coordination: In hand manipuation training In Hand Manipulation Training: Picked up 9 pennies from therapists hand placed near pocket level, supinated hand while holding penny (imitating whcked what coin he is holding), and placing into coin slot. Mindifficulty and increased time. Pt verbalized increased numbness in hand and 2nd digit with exertion.     Manual Therapy Manual Therapy: Edema management Edema Management: Edema massage and manual stretching to left wrist and hand to decrease edema and improve mobility Myofascial Release: MFR and manual stretching to left upper arm, wrist, and hand with gentle joint mobs at wrist and hand to decrease tightness and edema and improve mobility.  Occupational Therapy Assessment and Plan OT Assessment  and Plan Clinical Impression Statement: Pt was having increased pain in left hand this session - also mentioned numbness and stiffness. He says he sometimes has pain at the end of therapy sessions, but the pain is worse overall today.  Good results with MFR.  Pt tolerated well fine motor grasp and release exercises - but had increased numbness in 2nd digit as result. OT Plan: Re-Eval prior to 31st. Improve grasp on household objects. Attempt picking up coins without looking (pending pt's numbness at beginning of session).   Goals Short Term Goals Short Term Goal 1: Patient will be educated on HEP Short Term Goal 1 Progress: Met Short Term Goal 2: Patient will increase to use of left hand as an active assist with all daily activities 25% of the time. Short Term Goal 2 Progress: Met Short Term Goal 3: Patient will tolerate weightbearing on LUE for 3 minute for increased independence with BADL.  Short Term Goal 3 Progress: Met Short Term Goal 4: Patient will demonstrate gross grasp/digit flexion >/= 75% and extension of at least 25% to increase ability to pull pants Short Term Goal 4 Progress: Progressing toward goal Short Term Goal 5: Patient will demo opposition to at least digit 2 to increase ability to utilize L hand for clothing fasteners Short Term Goal 5 Progress: Met Short Term Goal 6: Patient will complete the Nine Hole Peg Test.  Short Term Goal 6 Progress: Met Short Term Goal 7: Patient will  increase left grip strength to 15 pounds and pinch strength to 2 pounds for increased ability to open containers  Short Term Goal 7 Progress: Progressing toward goal Long Term Goals Long Term Goal 1: Patient will return to highest level of independence with all leisure and daily activities.  Long Term Goal 1 Progress: Progressing toward goal Long Term  Goal 2: Patient will  increase left grip strength to >/=35 pounds and pinch strength to 8 pounds for increased ability to open containers Long Term  Goal 2 Progress: Progressing toward goal Long Term Goal 3: Patient will complete the Nine Hole Peg Test in standardized fashion, and oppose all digits for greater [facility] needed to fasten buttons.  Long Term Goal 3 Progress: Progressing toward goal Long Term Goal 4: Patient will have at least 4+/5 strength in proximal LUE for increased ability to pick up tackle box  Long Term Goal 4 Progress: Progressing toward goal Long Term Goal 5: Patient will report independent ability to shave with Lt hand  Long Term Goal 5 Progress: Progressing toward goal  Problem List Patient Active Problem List   Diagnosis Date Noted  . PVD (peripheral vascular disease) 01/23/2014  . Dysarthria due to cerebrovascular accident 12/03/2013  . Difficulty in walking(719.7) 12/03/2013  . Lack of coordination 12/03/2013  . Hip pain, bilateral--chronic 11/22/2013  . CVA (cerebral infarction) 11/21/2013  . Embolic cerebral infarction   . Unspecified cerebral artery occlusion with cerebral infarction 11/16/2013  . COPD (chronic obstructive pulmonary disease) 08/27/2013  . CAD S/P percutaneous coronary angioplasty 07/26/2013  . HTN (hypertension) 07/26/2013  . Hyperlipemia 07/26/2013  . History of tobacco use in home 07/26/2013    End of Session Activity Tolerance: Patient tolerated treatment well General Behavior During Therapy: [facility] for tasks assessed/performed  GO    Bea Graff, MS, OTR/L [phone]  02/07/2014, 4:22 PM

## 2014-02-08 ENCOUNTER — Telehealth: Payer: Self-pay | Admitting: Neurology

## 2014-02-08 DIAGNOSIS — G4733 Obstructive sleep apnea (adult) (pediatric): Secondary | ICD-10-CM

## 2014-02-08 DIAGNOSIS — I639 Cerebral infarction, unspecified: Secondary | ICD-10-CM

## 2014-02-08 DIAGNOSIS — E669 Obesity, unspecified: Secondary | ICD-10-CM

## 2014-02-08 DIAGNOSIS — I2510 Atherosclerotic heart disease of native coronary artery without angina pectoris: Secondary | ICD-10-CM

## 2014-02-08 NOTE — Telephone Encounter (Signed)
Dr. Roseanne Reno patient for attended sleep study.  Height: 5'10  Weight: 212 lbs.  BMI: 30.42  Past Medical History: Coronary artery disease  Wheezing  Chronic cough  Peripheral vascular disease  Hypertension  Hyperlipidemia  Reflux  COPD (chronic obstructive pulmonary disease)  Myocardial infarction  CHF (congestive heart failure)  Embolic cerebral infarction  S/P IV t-PA, post-tPA hemorrhagic transformation,clinically asymptomatic, scattered right MCA ischemic infarcts, right occipital lobe hemorrhagic infarction with focal hematoma, small volume of right subarachnoid hemorrhage    Sleep Symptoms: The patient's wife states that his nose, he has just overnight sleep, he has excessive daytime sleepiness and she has occasional episodes of witnessed sleep apnea.     Medications:  Aspirin (Tablet Delayed Response) aspirin 81 MG Take 1 tablet (81 mg total) by mouth daily. Lisinopril-Hydrochlorothiazide (Tab) PRINZIDE,ZESTORETIC 10-12.5 MG Take 1 tablet by mouth daily. Nitroglycerin (SL Tab) NITROSTAT 0.4 MG Place 0.4 mg under the tongue every 5 (five) minutes as needed for chest pain. Omeprazole (Capsule Delayed Release) PRILOSEC 20 MG Take 20 mg by mouth daily. Simvastatin (Tab) ZOCOR 40 MG Take 40 mg by mouth every evening. Skin Protectants, Misc. (Cream) EUCERIN Apply 1 application topically 2 (two) times daily.   Insurance: [employer]-Medicare  Dr. Clydene Fake Plans: had a long discussion with the patient and his wife regarding his recent admission for stroke, discuss results of hospital evaluation, stroke risk factors and risk factor modification and answered questions. Continue aspirin for stroke prevention with strict control of hypertension with blood pressure goal below 130/90 and lipids with LDL cholesterol goal below 130 mg percent. Continue outpatient occupational therapy. Polysomnogram for sleep apnea. Return for followup in 3 months with Alphonsus Sias, NP or call earlier if necessary.       Please review patient information and submit instructions for scheduling and orders for sleep technologist.  Thank you!

## 2014-02-08 NOTE — Telephone Encounter (Signed)
This patient is referred by Dr. Leonie Man for a sleep study due to a report of snoring and witnessed apneas. The patient has a pertinent medical history of heart disease, obesity, chronic lung disease, hypertension, hyperlipidemia and stroke. I will order a split-night sleep study and see the patient in sleep medicine consultation afterwards.

## 2014-02-11 ENCOUNTER — Telehealth: Payer: Self-pay | Admitting: Neurology

## 2014-02-11 NOTE — Telephone Encounter (Signed)
I called and spoke with the patient's spouse about the cost of the sleep study and to schedule an appointment. I informed the patient's spouse about the estimated cost of the sleep study and what all is involved in the study. Patient's spouse stated she will callback, after speaking with her about the study.

## 2014-02-12 ENCOUNTER — Ambulatory Visit (HOSPITAL_COMMUNITY)
Admission: RE | Admit: 2014-02-12 | Discharge: 2014-02-12 | Disposition: A | Discharge status: Home / Self Care | Payer: Medicare Other | Source: Ambulatory Visit | Attending: Family Medicine | Admitting: Family Medicine

## 2014-02-12 NOTE — Evaluation (Signed)
Occupational Therapy Re-Evaluation  Patient Details  Name: Joshua Horton MRN: [id number] Date of Birth: 03-13-1946  Today's Date: 02/12/2014 Time: 1110-1145 OT Time Calculation (min): 35 min ROM/MMT 0998-3382 25' Therapeutic exercises 10'  Visit#: 18 of 24  Re-eval: 02/12/14     Authorization: [facility] Medicare  Authorization Time Period: before 20th visit  Authorization Visit#: 75 of 92   Past Medical History:  Past Medical History  Diagnosis Date  . Coronary artery disease   . Wheezing   . Chronic cough   . Peripheral vascular disease   . Hypertension   . Hyperlipidemia   . Reflux   . COPD (chronic obstructive pulmonary disease)   . Myocardial infarction   . CHF (congestive heart failure)   . Embolic cerebral infarction     S/P IV t-PA,  post-tPA hemorrhagic transformation,clinically asymptomatic, scattered right MCA ischemic infarcts, right occipital lobe hemorrhagic infarction with focal hematoma, small volume of right subarachnoid hemorrhage    Past Surgical History:  Past Surgical History  Procedure Laterality Date  . Coronary angioplasty with stent placement  2003  . Cervical disc surgery      Dr Vertell Limber  . Tee without cardioversion N/A 11/19/2013    Procedure: TRANSESOPHAGEAL ECHOCARDIOGRAM (TEE);  Surgeon: Josue Hector, MD;  [facility] ENDOSCOPY;  Service: Cardiovascular;  Laterality: N/A;  . 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  Pain Assessment Currently in Pain?: No/denies  Reassessment current (one month ago) Sensation/Coordination/Edema Coordination 9 Hole Peg Test: $RemoveB'5\' 9"'OwiriLLw$  independently in standardized fashion (able to place 4/9 pegs in box independently, OT held remaining 5 upright for him to pickup and place.  He removed all independently total time 3'13") Edema Edema:  (left MCPJ 21.5cm (22.0 cm))  Additional  Assessments LUE AROM (degrees) Left Shoulder Flexion: 145 Degrees (145) Left Elbow Flexion: 137 (130) Left Elbow Extension: 0 (0) Left Forearm Pronation: 90 Degrees (90) Left Forearm Supination: 80 Degrees (75) Left Wrist Extension: 48 Degrees (48) Left Wrist Flexion: 60 Degrees (55) Left Composite Finger Extension: 75% (75%) Left Composite Finger Flexion: 75% (50%) Left Thumb Opposition:  (between digit 2 and digit 3 (digit 2)) LUE Strength Left Shoulder Flexion:  (4+/5 (4/5)) Left Shoulder ABduction:  (4+/5 (4/5)) Left Shoulder Internal Rotation:  (4+/5 (4/5)) Left Shoulder External Rotation:  (4+/5 (4/5)) Left Elbow Flexion: 5/5 (4/5) Left Elbow Extension: 5/5 (3+/5) Left Forearm Pronation: 5/5 (4-/5) Left Forearm Supination: 5/5 (4-/5) Left Wrist Flexion: 4/5 (4-/5) Left Wrist Extension: 4/5 (4-/5) Grip (lbs): 15 (11) Lateral Pinch: 6 lbs (4) 3 Point Pinch: 6 lbs (4) Left Hand Strength - Pinch (lbs) Lateral Pinch: 6 lbs (4) 3 Point Pinch: 6 lbs (4)   Exercise/Treatments Hand Exercises Theraputty - Grip: green        Occupational Therapy Assessment and Plan OT Assessment and Plan Clinical Impression Statement: A:  Patient reports that he does whatever actiivty at home that he wants.  He is driving, working in his building, and and Secondary school teacher coops.  He is not able to grasp as well as he would like with his left hand, however, he will continue to work on this at home with HEP for strengthening.  OT Plan: P:  DC to HEP.   Goals Short Term Goals Short Term Goal 1: Patient will be educated on HEP  Short Term Goal 1 Progress: Met Short Term Goal 2: Patient will increase to use of left hand as an active assist with all daily activities 25% of the time. Short Term Goal 2 Progress: Met Short Term Goal 3: Patient will tolerate weightbearing on LUE for 3 minute for increased independence with BADL.  Short Term Goal 3 Progress: Met Short Term Goal 4: Patient will  demonstrate gross grasp/digit flexion >/= 75% and extension of at least 25% to increase ability to pull pants Short Term Goal 4 Progress: Met Short Term Goal 5: Patient will demo opposition to at least digit 2 to increase ability to utilize L hand for clothing fasteners Short Term Goal 5 Progress: Met Short Term Goal 6: Patient will complete the Nine Hole Peg Test.  Short Term Goal 6 Progress: Met Short Term Goal 7: Patient will  increase left grip strength to 15 pounds and pinch strength to 2 pounds for increased ability to open containers  Short Term Goal 7 Progress: Met Long Term Goals Long Term Goal 1: Patient will return to highest level of independence with all leisure and daily activities.  Long Term Goal 1 Progress: Progressing toward goal Long Term Goal 2: Patient will  increase left grip strength to >/=35 pounds and pinch strength to 8 pounds for increased ability to open containers Long Term Goal 2 Progress: Progressing toward goal Long Term Goal 3: Patient will complete the Nine Hole Peg Test in standardized fashion, and oppose all digits for greater [facility] needed to fasten buttons.  Long Term Goal 3 Progress: Partly met Long Term Goal 4: Patient will have at least 4+/5 strength in proximal LUE for increased ability to pick up tackle box  Long Term Goal 4 Progress: Met Long Term Goal 5: Patient will report independent ability to shave with Lt hand  Long Term Goal 5 Progress: Met  Problem List Patient Active Problem List   Diagnosis Date Noted  . PVD (peripheral vascular disease) 01/23/2014  . Dysarthria due to cerebrovascular accident 12/03/2013  . Difficulty in walking(719.7) 12/03/2013  . Lack of coordination 12/03/2013  . Hip pain, bilateral--chronic 11/22/2013  . CVA (cerebral infarction) 11/21/2013  . Embolic cerebral infarction   . Unspecified cerebral artery occlusion with cerebral infarction 11/16/2013  . COPD (chronic obstructive pulmonary disease) 08/27/2013  . CAD  S/P percutaneous coronary angioplasty 07/26/2013  . HTN (hypertension) 07/26/2013  . Hyperlipemia 07/26/2013  . History of tobacco use in home 07/26/2013    End of Session Activity Tolerance: Patient tolerated treatment well General Behavior During Therapy: [facility] for tasks assessed/performed OT Plan of Care OT Home Exercise Plan: green theraputty  Consulted and Agree with Plan of Care: Patient  GO Functional Assessment Tool Used: clinical observation  is 80% and grip L/R 15/75 is 20%  Functional Limitation: Carrying, moving and handling objects Carrying, Moving and Handling Objects Goal Status (Q6761): At least 1 percent but less than 20 percent impaired, limited or restricted Carrying, Moving and Handling Objects Discharge Status [phone]): At least 20 percent but less than 40 percent impaired, limited or restricted  Vangie Bicker, OTR/L  02/12/2014, 11:46 AM  Physician Documentation Your signature is required to indicate approval of the treatment plan as stated above.  Please sign and either send electronically or make a copy of this report for your files and return this physician signed original.  Please mark one 1.__approve of plan  2. ___approve of plan with the following conditions.   ______________________________  _____________________ Physician Signature                                                                                                             Date

## 2014-02-13 ENCOUNTER — Ambulatory Visit (INDEPENDENT_AMBULATORY_CARE_PROVIDER_SITE_OTHER): Payer: Medicare Other | Admitting: *Deleted

## 2014-02-13 DIAGNOSIS — I639 Cerebral infarction, unspecified: Secondary | ICD-10-CM

## 2014-02-13 DIAGNOSIS — I6350 Cerebral infarction due to unspecified occlusion or stenosis of unspecified cerebral artery: Secondary | ICD-10-CM

## 2014-02-14 ENCOUNTER — Ambulatory Visit: Payer: Self-pay | Admitting: Neurology

## 2014-02-19 ENCOUNTER — Encounter: Payer: Self-pay | Admitting: Internal Medicine

## 2014-02-20 ENCOUNTER — Encounter: Payer: Self-pay | Admitting: Cardiovascular Disease

## 2014-02-20 ENCOUNTER — Ambulatory Visit (INDEPENDENT_AMBULATORY_CARE_PROVIDER_SITE_OTHER): Payer: Medicare Other | Admitting: Cardiovascular Disease

## 2014-02-20 VITALS — BP 106/72 | HR 93 | Ht 70.0 in | Wt 211.0 lb

## 2014-02-20 DIAGNOSIS — I6350 Cerebral infarction due to unspecified occlusion or stenosis of unspecified cerebral artery: Secondary | ICD-10-CM

## 2014-02-20 DIAGNOSIS — I10 Essential (primary) hypertension: Secondary | ICD-10-CM

## 2014-02-20 DIAGNOSIS — Z87891 Personal history of nicotine dependence: Secondary | ICD-10-CM

## 2014-02-20 DIAGNOSIS — J449 Chronic obstructive pulmonary disease, unspecified: Secondary | ICD-10-CM

## 2014-02-20 DIAGNOSIS — I739 Peripheral vascular disease, unspecified: Secondary | ICD-10-CM

## 2014-02-20 DIAGNOSIS — I639 Cerebral infarction, unspecified: Secondary | ICD-10-CM

## 2014-02-20 DIAGNOSIS — Z9861 Coronary angioplasty status: Secondary | ICD-10-CM

## 2014-02-20 DIAGNOSIS — I2510 Atherosclerotic heart disease of native coronary artery without angina pectoris: Secondary | ICD-10-CM

## 2014-02-20 DIAGNOSIS — E785 Hyperlipidemia, unspecified: Secondary | ICD-10-CM

## 2014-02-20 NOTE — Progress Notes (Signed)
Patient ID: Joshua Horton, male   DOB: 1946-08-23, 68 y.o.   MRN: [id number]      SUBJECTIVE: I most recently saw Mr. Lu Horton in 07/2013. He has a h/o known CAD with a h/o NSTEMI in April 2003. At that time, he underwent cardiac catheterization which revealed the following:   1. Left ventriculogram; Normal left ventricular ejection fraction and no  wall motion abnormalities.  2. Left main coronary artery; free of critical disease.  3. Left anterior descending; 50% segmental narrowing after the origin of the  diagonal, diagonal was noted to have 40% ostial narrowing and some mild  diffuse luminal irregularities, there is also a 40% segmental narrowing in  the mid LAD.  4. The circumflex had a 70% narrowing prior to the bifurcation of the AV  circumflex and marginal, the marginal then had a 50% segmental narrowing  followed by a 90% focal narrowing. The AV circumflex itself had a 75%  narrowing.  5. Right coronary artery had 50% proximal narrowing as well as diffuse 30%  narrowing as well as 50% narrowing prior to the takeoff of the PDA.  Dr. Lia Foyer then performed angioplasty and stenting to the 70% lesion in the  circumflex. He then performed an angioplasty of the AV circumflex passing  through the previously placed stent. TIMI 3 flow resulted in all vessels.   He also has HTN, hyperlipidemia, and a prior h/o tobacco abuse. He has known PVD and follows with a vascular surgeon, with left common iliac artery occlusion. He had a 2-D transthoracic echocardiogram on 1-2-15which showed normal LV systolic function, EF 25-95% and grade I diastolic dysfunction.  He underwent a low risk nuclear stress test in September 2014 which revealed normal myocardial perfusion with inferior artifact. To further evaluate his dyspnea, I had him undergo pulmonary function testing which revealed COPD.  He was hospitalized in January 2015 with acute right MCA infarcts and received TPA. He subsequently developed  a right occipital lobe hemorrhagic infarct with focal hematoma. He underwent a TEE during that hospitalization which showed normal left ventricular systolic function and was negative for ASD, PFO or left atrial appendage thrombus. He had a loop recorder placed by Dr. Caryl Horton on November 19, 2013 to evaluate for paroxysmal atrial fibrillation.  He quit smoking in May 2014.  He denies chest pain. He also denies shortness of breath. He tried using an inhaler twice but felt it gave him no relief for his COPD. He still has weakness in his left hand. He denies palpitations and leg swelling.  Allergies  Allergen Reactions  . Ivp Dye [Iodinated Diagnostic Agents]     Current Outpatient Prescriptions  Medication Sig Dispense Refill  . aspirin EC 81 MG EC tablet Take 1 tablet (81 mg total) by mouth daily.      Marland Kitchen lisinopril-hydrochlorothiazide (PRINZIDE,ZESTORETIC) 10-12.5 MG per tablet Take 1 tablet by mouth daily.      . nitroGLYCERIN (NITROSTAT) 0.4 MG SL tablet Place 0.4 mg under the tongue every 5 (five) minutes as needed for chest pain.      Marland Kitchen omeprazole (PRILOSEC) 20 MG capsule Take 20 mg by mouth daily.      . simvastatin (ZOCOR) 40 MG tablet Take 40 mg by mouth every evening.       No current facility-administered medications for this visit.    Past Medical History  Diagnosis Date  . Coronary artery disease   . Wheezing   . Chronic cough   . Peripheral vascular disease   .  Hypertension   . Hyperlipidemia   . Reflux   . COPD (chronic obstructive pulmonary disease)   . Myocardial infarction   . CHF (congestive heart failure)   . Embolic cerebral infarction     S/P IV t-PA,  post-tPA hemorrhagic transformation,clinically asymptomatic, scattered right MCA ischemic infarcts, right occipital lobe hemorrhagic infarction with focal hematoma, small volume of right subarachnoid hemorrhage     Past Surgical History  Procedure Laterality Date  . Coronary angioplasty with stent placement  2003    . Cervical disc surgery      Dr Vertell Limber  . Tee without cardioversion N/A 11/19/2013    Procedure: TRANSESOPHAGEAL ECHOCARDIOGRAM (TEE);  Surgeon: Josue Hector, MD;  Location: [facility] ENDOSCOPY;  Service: Cardiovascular;  Laterality: N/A;  . Loop recorder implant  11/19/2013    MDT LinQ implanted by Dr Joshua Horton for cryptogenic stroke    History   Social History  . Marital Status: Married    Spouse Name: tessie    Number of Children: 2  . Years of Education: 10th   Occupational History  . RETIRED    Social History Main Topics  . Smoking status: Former Smoker -- 1.00 packs/day for 51 years    Types: Cigarettes    Quit date: 04/03/2013  . Smokeless tobacco: Never Used  . Alcohol Use: No  . Drug Use: No  . Sexual Activity: Not on file   Other Topics Concern  . Not on file   Social History Narrative   Patient lives  At home with his wife... Patient drinks caffinated drinks.     Filed Vitals:   02/20/14 1033  BP: 106/72  Pulse: 93  Height: 5\' 10"  (1.778 m)  Weight: 211 lb (95.709 kg)    PHYSICAL EXAM General: NAD  Neck: No JVD, no thyromegaly or thyroid nodule.  Lungs: Clear to auscultation bilaterally with normal respiratory effort.  CV: Nondisplaced PMI. Heart regular S1/S2, no S3/S4, no murmur. No peripheral edema. No carotid bruit. Diminished pedal pulses.  Abdomen: Soft, nontender, no hepatosplenomegaly, no distention.  Neurologic: Alert and oriented x 3. Left hand weakness. Psych: Normal affect.  Extremities: No clubbing or cyanosis.    ECG: reviewed and available in electronic records.      ASSESSMENT AND PLAN: 1. CAD with prior bare metal stenting in 2003: Normal nuclear stress test in September 2014. 2. HTN: well controlled on lisinopril-HCTZ. No dosing adjustments today.  3. Hyperlipidemia: continue simvastatin.  4. PVD: follows up with a vascular surgeon. 5. Stroke: He is maintained on aspirin. His blood pressure is controlled. He has an implantable loop  recorder to evaluate for paroxysmal atrial fibrillation.  Dispo: f/u 1 year.  Kate Sable, M.D., F.A.C.C.

## 2014-02-20 NOTE — Patient Instructions (Signed)
Continue all current medications. Your physician wants you to follow up in:  1 year.  You will receive a reminder letter in the mail one-two months in advance.  If you don't receive a letter, please call our office to schedule the follow up appointment   

## 2014-03-21 ENCOUNTER — Ambulatory Visit (INDEPENDENT_AMBULATORY_CARE_PROVIDER_SITE_OTHER): Payer: Medicare Other | Admitting: *Deleted

## 2014-03-21 DIAGNOSIS — I6350 Cerebral infarction due to unspecified occlusion or stenosis of unspecified cerebral artery: Secondary | ICD-10-CM

## 2014-03-21 DIAGNOSIS — I639 Cerebral infarction, unspecified: Secondary | ICD-10-CM

## 2014-04-04 NOTE — Addendum Note (Signed)
Encounter addended by: Debby Bud, OT on: 04/04/2014  8:10 AM<BR>     Documentation filed: Episodes

## 2014-04-08 LAB — MDC_IDC_ENUM_SESS_TYPE_REMOTE

## 2014-04-12 ENCOUNTER — Ambulatory Visit (INDEPENDENT_AMBULATORY_CARE_PROVIDER_SITE_OTHER): Payer: Medicare Other | Admitting: *Deleted

## 2014-04-12 DIAGNOSIS — I6350 Cerebral infarction due to unspecified occlusion or stenosis of unspecified cerebral artery: Secondary | ICD-10-CM

## 2014-04-12 DIAGNOSIS — I639 Cerebral infarction, unspecified: Secondary | ICD-10-CM

## 2014-04-12 LAB — MDC_IDC_ENUM_SESS_TYPE_REMOTE

## 2014-04-16 NOTE — Progress Notes (Signed)
Loop recorder 

## 2014-04-18 IMAGING — NM NM MYOCAR SINGLE W/SPECT W/WALL MOTION & EF
1 series · 6 of 6 positions shown · non-contrast
Comparison: none

Ordering Physician: KENSY RI

Yibran Physician: [REDACTED]al Data: 67-year-old male with known CAD status post previous
intervention to the circumflex, hypertension, hyperlipidemia, COPD,
now referred for the assessment of ischemia.
NUCLEAR MEDICINE STRESS MYOVIEW STUDY WITH SPECT AND LEFT
VENTRICULAR EJECTION FRACTION
Radionuclide Data: One-day rest/stress protocol performed with
[DATE] mCi of Xc-FFm Myoview.
Stress Data: Lexiscan bolus was given in standard fashion.  Heart
rate increased from 82 beats per minute up to 105 beats per minute,
and blood pressure remained stable 131/72.  The patient tolerated
infusion well.  No diagnostic ST-segment changes or arrhythmias
were noted.
EKG: Baseline tracing shows normal sinus rhythm at 82 beats per
minute with decreased R-wave progression.
Scintigraphic Data: Analysis of the raw perfusion data finds mild
diaphragmatic attenuation.
Tomographic views obtained using the short axis, vertical long
axis, and horizontal long axis planes.  There is a relatively
small, moderate intensity, fixed inferior defect.  Normal wall
motion is noted noted in this region, suggesting that this is most
consistent with attenuation rather than scar.  No definite ischemic
defects are noted.
Gated imaging reveals an EDV of 64, ESV of 19, TID ratio of 1.09,
and LVEF of 71% without wall motion abnormality.

[cr cardiac tc low dose · 6.41mm/px · 6 of 64 frames shown]
[frame 6/64]
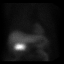
[frame 16/64]
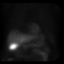
[frame 27/64]
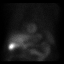
[frame 38/64]
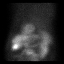
[frame 48/64]
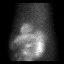
[frame 59/64]
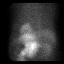

[6 of 6 positions shown; findings below may reference images not displayed]

IMPRESSION: Low risk Lexiscan Myoview.  No diagnostic ST-segment abnormalities
were noted.  Perfusion imaging is most consistent with
diaphragmatic/soft tissue attenuation affecting the inferior wall.
No definite ischemic defects are noted.  LVEF 71% with normal
volumes and TID ratio.

## 2014-04-24 ENCOUNTER — Encounter: Payer: Self-pay | Admitting: Internal Medicine

## 2014-05-06 LAB — MDC_IDC_ENUM_SESS_TYPE_REMOTE

## 2014-05-15 ENCOUNTER — Ambulatory Visit (INDEPENDENT_AMBULATORY_CARE_PROVIDER_SITE_OTHER): Payer: Medicare Other | Admitting: Nurse Practitioner

## 2014-05-15 ENCOUNTER — Encounter: Payer: Self-pay | Admitting: Nurse Practitioner

## 2014-05-15 ENCOUNTER — Encounter (INDEPENDENT_AMBULATORY_CARE_PROVIDER_SITE_OTHER): Payer: Self-pay

## 2014-05-15 VITALS — BP 153/82 | HR 79 | Wt 213.0 lb

## 2014-05-15 DIAGNOSIS — I69354 Hemiplegia and hemiparesis following cerebral infarction affecting left non-dominant side: Secondary | ICD-10-CM | POA: Insufficient documentation

## 2014-05-15 DIAGNOSIS — I6340 Cerebral infarction due to embolism of unspecified cerebral artery: Secondary | ICD-10-CM

## 2014-05-15 DIAGNOSIS — I69959 Hemiplegia and hemiparesis following unspecified cerebrovascular disease affecting unspecified side: Secondary | ICD-10-CM

## 2014-05-15 NOTE — Patient Instructions (Signed)
Continue aspirin for stroke prevention with strict control of hypertension with blood pressure goal below 140/90 and lipids with LDL cholesterol goal below 100 mg percent.  Return for followup in 6 months or call earlier if necessary.  Stroke Prevention Some medical conditions and behaviors are associated with an increased chance of having a stroke. You may prevent a stroke by making healthy choices and managing medical conditions. HOW CAN I REDUCE MY RISK OF HAVING A STROKE?   Stay physically active. Get at least 30 minutes of activity on most or all days.  Do not smoke. It may also be helpful to avoid exposure to secondhand smoke.  Limit alcohol use. Moderate alcohol use is considered to be:  No more than 2 drinks per day for men.  No more than 1 drink per day for nonpregnant women.  Eat healthy foods. This involves  Eating 5 or more servings of fruits and vegetables a day.  Following a diet that addresses high blood pressure (hypertension), high cholesterol, diabetes, or obesity.  Manage your cholesterol levels.  A diet low in saturated fat, trans fat, and cholesterol and high in fiber may control cholesterol levels.  Take any prescribed medicines to control cholesterol as directed by your health care provider.  Manage your diabetes.  A controlled-carbohydrate, controlled-sugar diet is recommended to manage diabetes.  Take any prescribed medicines to control diabetes as directed by your health care provider.  Control your hypertension.  A low-salt (sodium), low-saturated fat, low-trans fat, and low-cholesterol diet is recommended to manage hypertension.  Take any prescribed medicines to control hypertension as directed by your health care provider.  Maintain a healthy weight.  A reduced-calorie, low-sodium, low-saturated fat, low-trans fat, low-cholesterol diet is recommended to manage weight.  Stop drug abuse.  Avoid taking birth control pills.  Talk to your  health care provider about the risks of taking birth control pills if you are over 42 years old, smoke, get migraines, or have ever had a blood clot.  Get evaluated for sleep disorders (sleep apnea).  Talk to your health care provider about getting a sleep evaluation if you snore a lot or have excessive sleepiness.  Take medicines as directed by your health care provider.  For some people, aspirin or blood thinners (anticoagulants) are helpful in reducing the risk of forming abnormal blood clots that can lead to stroke. If you have the irregular heart rhythm of atrial fibrillation, you should be on a blood thinner unless there is a good reason you cannot take them.  Understand all your medicine instructions.  Make sure that other other conditions (such as anemia or atherosclerosis) are addressed. SEEK IMMEDIATE MEDICAL CARE IF:   You have sudden weakness or numbness of the face, arm, or leg, especially on one side of the body.  Your face or eyelid droops to one side.  You have sudden confusion.  You have trouble speaking (aphasia) or understanding.  You have sudden trouble seeing in one or both eyes.  You have sudden trouble walking.  You have dizziness.  You have a loss of balance or coordination.  You have a sudden, severe headache with no known cause.  You have new chest pain or an irregular heartbeat. Any of these symptoms may represent a serious problem that is an emergency. Do not wait to see if the symptoms will go away. Get medical help at once. Call your local emergency services  (911 in [place] Do not drive yourself to the hospital. Document Released: 12/09/2004  Document Revised: 08/22/2013 Document Reviewed: 05/04/2013 [facility] Patient Information 2015 [place], [place]. This information is not intended to replace advice given to you by your health care provider. Make sure you discuss any questions you have with your health care provider.

## 2014-05-15 NOTE — Progress Notes (Signed)
PATIENT: Joshua Horton DOB: [birth year]  REASON FOR VISIT: routine follow up for stroke HISTORY FROM: patient  HISTORY OF PRESENT ILLNESS: Joshua Horton is a 65 year Caucasian male seen for first office follow for the following hospital admission for stroke and 11/16/2013. He developed sudden onset of speech difficulty and left hand weakness. He presented as a code stroke with initial NIH stroke scale of 5. His hematoma was felt to clinically asymptomatic. Transthoracic echo as well as TEE did not reveal a cardiac source of embolism. Patient had a loop recorder implanted to look for paroxysmal atrial fibrillation. He was on aspirin prior to admission which was temporarily held because of the hematoma but has since been restarted. LDL cholesterol was 95 mg percent and he was continued on Zocor. Hemoglobin A1c was 6.5%. Carotid Doppler showed 40-59% right carotid stenosis. He was advised to quit smoking. Patient was transferred to inpatient rehabilitation to recover from his left hemiparesis. Patient has done well since discharge, his sppech is improved and mild left hand weakness persists. His doing outpatient physical and occupational therapy. His loop recorder so far not revealing atrial fibrillation. The patient's wife states that he snores, he has excessive daytime sleepiness and he has occasional episodes of witnessed sleep apnea. Patient states is tolerating aspirin well without bruising. His blood pressure is well-controlled 134/71 today. He has not evaluated for sleep apnea yet.  UPDATE 05/15/14 (LL): Since last visit, he has done well, with no new neurovascular symptoms. His speech improved to baseline which was mildly dysarthric.  He has persistent left weaker hand grip and loss of fine motor skill.  He states his blood pressure is well controlled although it is elevated in the office today at 153/82.  He is tolerating aspirin well with no signs of significant bleeding or bruising. His wife states  he is always compliant with his medications.  He did not want to go through with sleep study at this time.  His loop recorder has not detected atrial fibrillation. He has quit smoking since last visit which I have commended him on.  ROS:  14 system review of systems is positive for left hand weakness only.  ALLERGIES: Allergies  Allergen Reactions  . Ivp Dye [Iodinated Diagnostic Agents]     HOME MEDICATIONS: Outpatient Prescriptions Prior to Visit  Medication Sig Dispense Refill  . aspirin EC 81 MG EC tablet Take 1 tablet (81 mg total) by mouth daily.      Marland Kitchen lisinopril-hydrochlorothiazide (PRINZIDE,ZESTORETIC) 10-12.5 MG per tablet Take 1 tablet by mouth daily.      . nitroGLYCERIN (NITROSTAT) 0.4 MG SL tablet Place 0.4 mg under the tongue every 5 (five) minutes as needed for chest pain.      Marland Kitchen omeprazole (PRILOSEC) 20 MG capsule Take 20 mg by mouth daily.      . simvastatin (ZOCOR) 40 MG tablet Take 40 mg by mouth every evening.       No facility-administered medications prior to visit.    PHYSICAL EXAM There were no vitals filed for this visit. There is no weight on file to calculate BMI. No exam data present No flowsheet data found.  No flowsheet data found.   Physical Exam  General: Obese middle aged Caucasian male, seated, in no evident distress  Head: head normocephalic and atraumatic. Orohparynx benign  Neck: supple with no carotid or supraclavicular bruits  Cardiovascular: regular rate and rhythm, no murmurs  Musculoskeletal: no deformity  Skin: Seborrheic dermatitis on bilateral ears  and cheeks. Vascular: Normal pulses all extremities   Neurologic Exam  Mental Status: Awake and fully alert. Oriented to place and time. Recent and remote memory intact. Attention span, concentration and fund of knowledge appropriate. Mood and affect appropriate.  Cranial Nerves: Fundoscopic exam not done. Pupils equal, briskly reactive to light. Extraocular movements full without  nystagmus. Visual fields full to confrontation. Hearing intact. Facial sensation intact. Face, tongue, palate moves normally and symmetrically.  Motor: Normal bulk and tone. Normal strength in all tested extremity muscles. Left hand weakness and diminished fine finger movements. Orbits right over left upper extremity.  Sensory: intact to touch and pinprick and vibratory sensation.  Coordination: Rapid alternating movements normal in all extremities. Finger-to-nose and heel-to-shin performed accurately bilaterally.  Gait and Station: Arises from chair without difficulty. Stance is normal. Gait demonstrates normal stride length and balance . Unable to heel, toe and tandem walk without difficulty. Drags left leg. Reflexes: 1+ and symmetric. Toes downgoing.    ASSESSMENT: 21 year Caucasian male with embolic right MCA infarct of unidentified source in January 2015 treated with IV t-PA with post TPA right occipital focal hematoma which was clinically silent with multiple vascular risk factors of hypertension, hyperlipidemia, diabetes, smoking, and carotid stenosis.   PLAN:  I had a long discussion with the patient and his wife regarding his stroke, discuss results of hospital evaluation, stroke risk factors and risk factor modification and answered questions. Continue aspirin for stroke prevention with strict control of hypertension with blood pressure goal below 130/90 and lipids with LDL cholesterol goal below 130 mg percent.  Check repeat carotid dopplers at next visit. Return for followup in 6 months or call earlier if necessary.  Philmore Pali, MSN, NP-C 05/15/2014, 2:08 PM [facility] [address] [place],  [address] [phone]  Note: This document was prepared with digital dictation and possible smart phrase technology. Any transcriptional errors that result from this process are unintentional.

## 2014-05-16 ENCOUNTER — Telehealth: Payer: Self-pay | Admitting: Orthopedic Surgery

## 2014-05-16 NOTE — Telephone Encounter (Signed)
Call received from patient and spouse, [facility], relaying that patient had a thumb problem, evidently from an injury 1 week to 10 days ago.  States he is complaining of increasing pain, and has not yet had any treatment.  States " it may even be dislocated." Advised Emergency Room, as patient needs complete work-up and evaluation, and explained that, if it is dislocated, that is a procedure that is performed in the hospital, not in the office.  Relayed that we will be happy to schedule an Emergency room follow up appointment if that is what is recommended.

## 2014-05-17 NOTE — Progress Notes (Signed)
I agree with above 

## 2014-05-24 ENCOUNTER — Emergency Department (HOSPITAL_COMMUNITY): Payer: Medicare Other

## 2014-05-24 ENCOUNTER — Emergency Department (HOSPITAL_COMMUNITY)
Admission: EM | Admit: 2014-05-24 | Discharge: 2014-05-24 | Disposition: A | Discharge status: Home / Self Care | Payer: Medicare Other | Attending: Emergency Medicine | Admitting: Emergency Medicine

## 2014-05-24 ENCOUNTER — Ambulatory Visit (INDEPENDENT_AMBULATORY_CARE_PROVIDER_SITE_OTHER): Payer: Medicare Other | Admitting: *Deleted

## 2014-05-24 ENCOUNTER — Encounter (HOSPITAL_COMMUNITY): Payer: Self-pay | Admitting: Emergency Medicine

## 2014-05-24 DIAGNOSIS — I639 Cerebral infarction, unspecified: Secondary | ICD-10-CM

## 2014-05-24 DIAGNOSIS — Z8673 Personal history of transient ischemic attack (TIA), and cerebral infarction without residual deficits: Secondary | ICD-10-CM | POA: Insufficient documentation

## 2014-05-24 DIAGNOSIS — Z87891 Personal history of nicotine dependence: Secondary | ICD-10-CM | POA: Insufficient documentation

## 2014-05-24 DIAGNOSIS — I252 Old myocardial infarction: Secondary | ICD-10-CM | POA: Insufficient documentation

## 2014-05-24 DIAGNOSIS — Z9861 Coronary angioplasty status: Secondary | ICD-10-CM | POA: Insufficient documentation

## 2014-05-24 DIAGNOSIS — M65849 Other synovitis and tenosynovitis, unspecified hand: Principal | ICD-10-CM

## 2014-05-24 DIAGNOSIS — E785 Hyperlipidemia, unspecified: Secondary | ICD-10-CM | POA: Insufficient documentation

## 2014-05-24 DIAGNOSIS — J4489 Other specified chronic obstructive pulmonary disease: Secondary | ICD-10-CM | POA: Insufficient documentation

## 2014-05-24 DIAGNOSIS — I10 Essential (primary) hypertension: Secondary | ICD-10-CM | POA: Insufficient documentation

## 2014-05-24 DIAGNOSIS — Z7982 Long term (current) use of aspirin: Secondary | ICD-10-CM | POA: Insufficient documentation

## 2014-05-24 DIAGNOSIS — I509 Heart failure, unspecified: Secondary | ICD-10-CM | POA: Insufficient documentation

## 2014-05-24 DIAGNOSIS — J449 Chronic obstructive pulmonary disease, unspecified: Secondary | ICD-10-CM | POA: Insufficient documentation

## 2014-05-24 DIAGNOSIS — Z79899 Other long term (current) drug therapy: Secondary | ICD-10-CM | POA: Insufficient documentation

## 2014-05-24 DIAGNOSIS — I2510 Atherosclerotic heart disease of native coronary artery without angina pectoris: Secondary | ICD-10-CM | POA: Insufficient documentation

## 2014-05-24 DIAGNOSIS — M779 Enthesopathy, unspecified: Secondary | ICD-10-CM

## 2014-05-24 DIAGNOSIS — M65839 Other synovitis and tenosynovitis, unspecified forearm: Secondary | ICD-10-CM | POA: Insufficient documentation

## 2014-05-24 DIAGNOSIS — I6350 Cerebral infarction due to unspecified occlusion or stenosis of unspecified cerebral artery: Secondary | ICD-10-CM

## 2014-05-24 DIAGNOSIS — K219 Gastro-esophageal reflux disease without esophagitis: Secondary | ICD-10-CM | POA: Insufficient documentation

## 2014-05-24 MED ORDER — PREDNISONE 10 MG PO TABS: ORAL_TABLET | ORAL | Status: DC

## 2014-05-24 MED ORDER — PREDNISONE 50 MG PO TABS
60.0000 mg | ORAL_TABLET | Freq: Once | ORAL | Status: AC
  Administered 2014-05-24: 60 mg via ORAL
  Filled 2014-05-24 (×2): qty 1

## 2014-05-24 NOTE — Discharge Instructions (Signed)
Tendinitis Tendinitis is swelling and inflammation of the tendons. Tendons are band-like tissues that connect muscle to bone. Tendinitis commonly occurs in the:   Shoulders (rotator cuff).  Heels (Achilles tendon).  Elbows (triceps tendon). CAUSES Tendinitis is usually caused by overusing the tendon, muscles, and joints involved. When the tissue surrounding a tendon (synovium) becomes inflamed, it is called tenosynovitis. Tendinitis commonly develops in people whose jobs require repetitive motions. SYMPTOMS  Pain.  Tenderness.  Mild swelling. DIAGNOSIS Tendinitis is usually diagnosed by physical exam. Your health care provider may also order X-rays or other imaging tests. TREATMENT Your health care provider may recommend certain medicines or exercises for your treatment. HOME CARE INSTRUCTIONS   Use a sling or splint for as long as directed by your health care provider until the pain decreases.  Put ice on the injured area.  Put ice in a plastic bag.  Place a towel between your skin and the bag.  Leave the ice on for 15-20 minutes, 3-4 times a day, or as directed by your health care provider.  Avoid using the limb while the tendon is painful. Perform gentle range of motion exercises only as directed by your health care provider. Stop exercises if pain or discomfort increase, unless directed otherwise by your health care provider.  Only take over-the-counter or prescription medicines for pain, discomfort, or fever as directed by your health care provider. SEEK MEDICAL CARE IF:   Your pain and swelling increase.  You develop new, unexplained symptoms, especially increased numbness in the hands. MAKE SURE YOU:   Understand these instructions.  Will watch your condition.  Will get help right away if you are not doing well or get worse. Document Released: 10/29/2000 Document Revised: 11/06/2013 Document Reviewed: 01/18/2011 [facility] Patient Information 2015 [place],  [place]. This information is not intended to replace advice given to you by your health care provider. Make sure you discuss any questions you have with your health care provider.   Use the medicine prescribed, taking your next dose of prednisone tomorrow. Rest your thumb,  Apply heat for 15 minutes 4 times daily.  Have Dr. Aline Brochure recheck your thumb in 1 week if not improved.

## 2014-05-24 NOTE — ED Notes (Signed)
Pain and jerking motion of thumb for 2 weeks, No known injury

## 2014-05-24 NOTE — ED Notes (Addendum)
Pt reports right thumb pain x2 weeks. Pt denies any known injury. No obvious deformity noted. Distal pulses strong intact. nad noted.full rom noted. Audible "popping" noise heard if pt bends right thumb.

## 2014-05-26 NOTE — ED Provider Notes (Signed)
CSN: [id number]     Arrival date & time 05/24/14  1212 History   First MD Initiated Contact with Patient 05/24/14 1239     Chief Complaint  Patient presents with  . Extremity Pain     (Consider location/radiation/quality/duration/timing/severity/associated sxs/prior Treatment) The history is provided by the patient and the spouse.    Joshua Horton is a 68 y.o. male predominantly left handed but has learned to use his right hand more since a recent cva left him with left sided weakness.  He describes soreness and a popping sensation with right thumb movement for the past 2 weeks.  He denies injury, has no weakness or numbness in the hand or thumb and denies redness or swelling.  He has taken no medicines or other treatment for this complaint prior to arrival.      Past Medical History  Diagnosis Date  . Coronary artery disease   . Wheezing   . Chronic cough   . Peripheral vascular disease   . Hypertension   . Hyperlipidemia   . Reflux   . COPD (chronic obstructive pulmonary disease)   . Myocardial infarction   . CHF (congestive heart failure)   . Embolic cerebral infarction     S/P IV t-PA,  post-tPA hemorrhagic transformation,clinically asymptomatic, scattered right MCA ischemic infarcts, right occipital lobe hemorrhagic infarction with focal hematoma, small volume of right subarachnoid hemorrhage    Past Surgical History  Procedure Laterality Date  . Coronary angioplasty with stent placement  2003  . Cervical disc surgery      Dr Vertell Limber  . Tee without cardioversion N/A 11/19/2013    Procedure: TRANSESOPHAGEAL ECHOCARDIOGRAM (TEE);  Surgeon: Josue Hector, MD;  Location: [facility] ENDOSCOPY;  Service: Cardiovascular;  Laterality: N/A;  . Loop recorder implant  11/19/2013    MDT LinQ implanted by Dr Caryl Comes for cryptogenic stroke   Family History  Problem Relation Age of Onset  . Heart disease    . Arthritis    . Cancer    . Kidney disease    . Cancer Mother   . Deep vein  thrombosis Father   . Heart disease Father   . Hyperlipidemia Father   . Hypertension Father   . Heart attack Father   . Peripheral vascular disease Father    History  Substance Use Topics  . Smoking status: Former Smoker -- 1.00 packs/day for 51 years    Types: Cigarettes    Quit date: 04/03/2013  . Smokeless tobacco: Never Used  . Alcohol Use: No    Review of Systems  Constitutional: Negative for fever.  Musculoskeletal: Positive for arthralgias. Negative for joint swelling and myalgias.  Neurological: Negative for weakness and numbness.      Allergies  Ivp dye  Home Medications   Prior to Admission medications   Medication Sig Start Date End Date Taking? Authorizing Provider  aspirin EC 81 MG EC tablet Take 1 tablet (81 mg total) by mouth daily. 11/28/13   Bary Leriche, PA-C  lisinopril-hydrochlorothiazide (PRINZIDE,ZESTORETIC) 10-12.5 MG per tablet Take 1 tablet by mouth daily.    Historical Provider, MD  nitroGLYCERIN (NITROSTAT) 0.4 MG SL tablet Place 0.4 mg under the tongue every 5 (five) minutes as needed for chest pain.    Historical Provider, MD  omeprazole (PRILOSEC) 20 MG capsule Take 20 mg by mouth daily.    Historical Provider, MD  predniSONE (DELTASONE) 10 MG tablet 6, 5, 4, 3, 2 then 1 tablet by mouth daily for 6  days total. 05/24/14   Evalee Jefferson, PA-C  simvastatin (ZOCOR) 40 MG tablet Take 40 mg by mouth every evening.    Historical Provider, MD   BP 153/76  Pulse 83  Temp(Src) 97.8 F (36.6 C) (Oral)  Resp 18  Ht 5\' 10"  (1.778 m)  Wt 213 lb (96.616 kg)  BMI 30.56 kg/m2  SpO2 97% Physical Exam  Constitutional: He appears well-developed and well-nourished.  HENT:  Head: Atraumatic.  Neck: Normal range of motion.  Cardiovascular:  Pulses equal bilaterally  Musculoskeletal:       Right hand: He exhibits normal range of motion, no tenderness, normal capillary refill, no deformity and no swelling. Normal sensation noted. Normal strength noted.  Mild  popping sensation at the mcp joint with flex/ext.  No locking, no palpable deformity, nodules.  No edema.  Distal sensation intact with less than 2 sec distal cap refill.  Neurological: He is alert. He has normal strength. He displays normal reflexes. No sensory deficit.  Skin: Skin is warm and dry.  Psychiatric: He has a normal mood and affect.    ED Course  Procedures (including critical care time) Labs Review Labs Reviewed - No data to display  Imaging Review No results found.   EKG Interpretation None      MDM   Final diagnoses:  Tendonitis    Patients labs and/or radiological studies were viewed and considered during the medical decision making and disposition process.Pt was placed in finger splint for prn use,  Advised to rest the joint as much as possible.  Prednisone taper,  Heat tx.  Referral to ortho for f/u care if sx persist.  The patient appears reasonably screened and/or stabilized for discharge and I doubt any other medical condition or other [facility] requiring further screening, evaluation, or treatment in the ED at this time prior to discharge.     Evalee Jefferson, PA-C 05/27/14 1317

## 2014-05-30 NOTE — Progress Notes (Signed)
Loop recorder 

## 2014-05-30 NOTE — ED Provider Notes (Signed)
Medical screening examination/treatment/procedure(s) were performed by non-physician practitioner and as supervising physician I was immediately available for consultation/collaboration.   EKG Interpretation None       Nat Christen, MD 05/30/14 1558

## 2014-06-03 ENCOUNTER — Encounter: Payer: Self-pay | Admitting: Internal Medicine

## 2014-06-20 ENCOUNTER — Ambulatory Visit (INDEPENDENT_AMBULATORY_CARE_PROVIDER_SITE_OTHER): Payer: Medicare Other | Admitting: *Deleted

## 2014-06-20 DIAGNOSIS — I6350 Cerebral infarction due to unspecified occlusion or stenosis of unspecified cerebral artery: Secondary | ICD-10-CM

## 2014-06-20 DIAGNOSIS — I639 Cerebral infarction, unspecified: Secondary | ICD-10-CM

## 2014-06-27 NOTE — Progress Notes (Signed)
Loop recorder 

## 2014-07-05 ENCOUNTER — Ambulatory Visit (INDEPENDENT_AMBULATORY_CARE_PROVIDER_SITE_OTHER): Payer: Medicare Other | Admitting: *Deleted

## 2014-07-05 DIAGNOSIS — I639 Cerebral infarction, unspecified: Secondary | ICD-10-CM

## 2014-07-05 DIAGNOSIS — I6350 Cerebral infarction due to unspecified occlusion or stenosis of unspecified cerebral artery: Secondary | ICD-10-CM

## 2014-07-26 NOTE — Progress Notes (Signed)
Loop recorder 

## 2014-07-28 LAB — MDC_IDC_ENUM_SESS_TYPE_REMOTE

## 2014-07-29 LAB — MDC_IDC_ENUM_SESS_TYPE_REMOTE

## 2014-07-30 LAB — MDC_IDC_ENUM_SESS_TYPE_REMOTE

## 2014-08-03 IMAGING — CT CT HEAD W/O CM
2 series · 15 of 30 positions shown, 19 images · non-contrast
Comparison: Prior CT from 11/17/2013

CLINICAL DATA: Evaluate involving the right MCA territory infarcts
and intracranial hemorrhage.

EXAM:
CT HEAD WITHOUT CONTRAST
TECHNIQUE: Contiguous axial images were obtained from the base of the skull
through the vertex without intravenous contrast.

[Series 3: head w/o · axial · non-contrast · 0.49mm/px · z∈[+80,+216]mm · 13 of 33 slices shown, 17 images]
[im 3/33  brain]
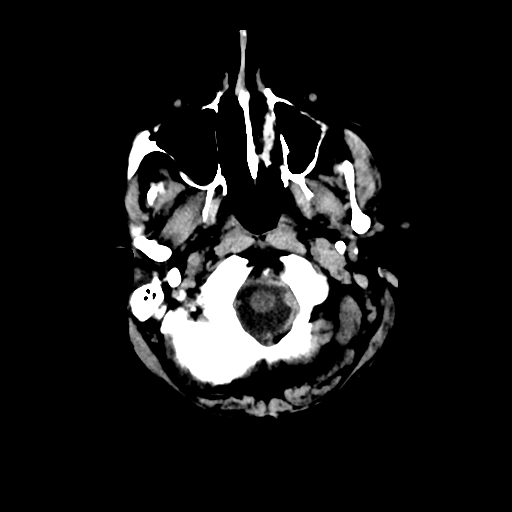
[im 3/33  bone]
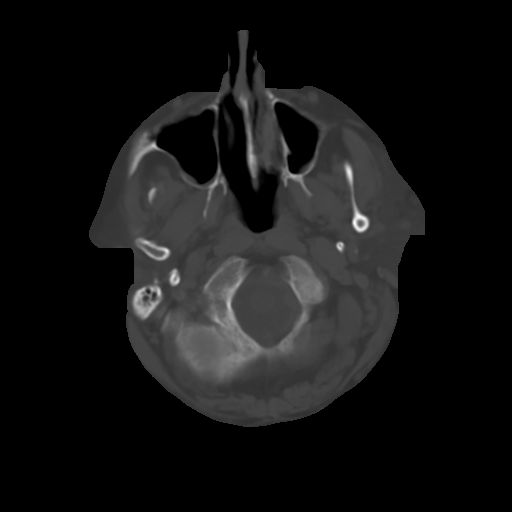
[im 5/33  brain]
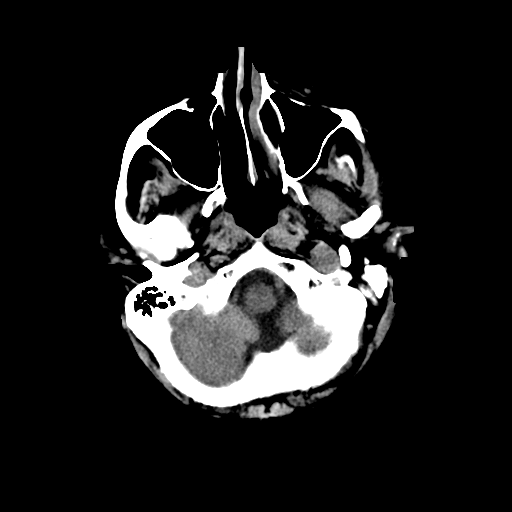
[im 7/33  brain]
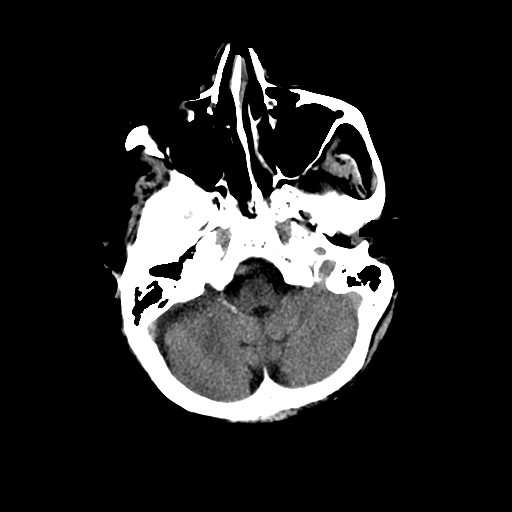
[im 10/33  brain]
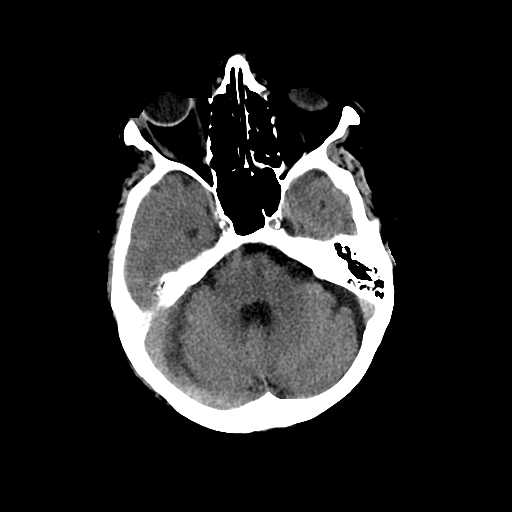
[im 12/33  brain]
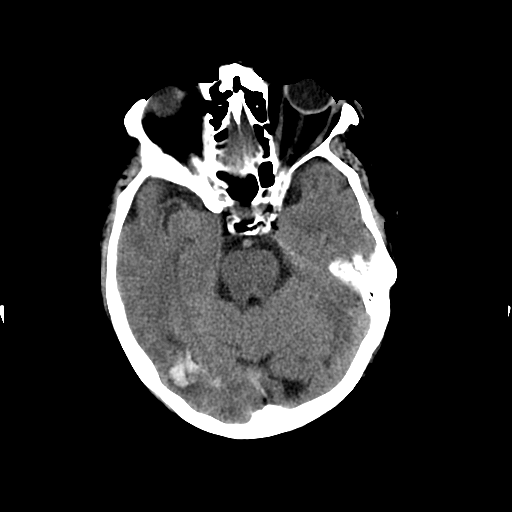
[im 12/33  bone]
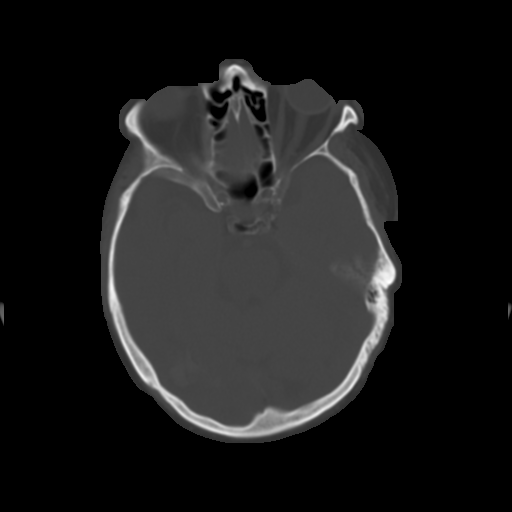
[im 14/33  brain]
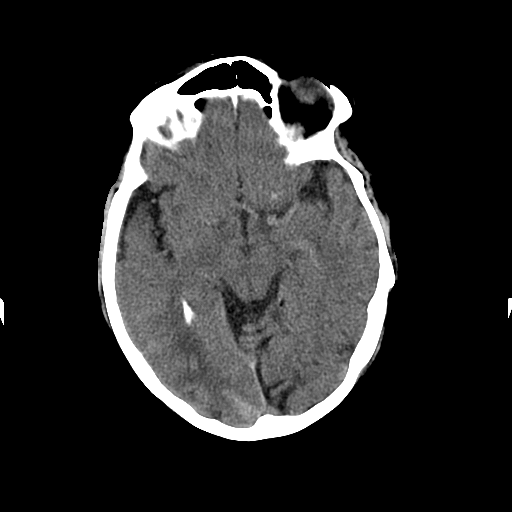
[im 17/33  brain]
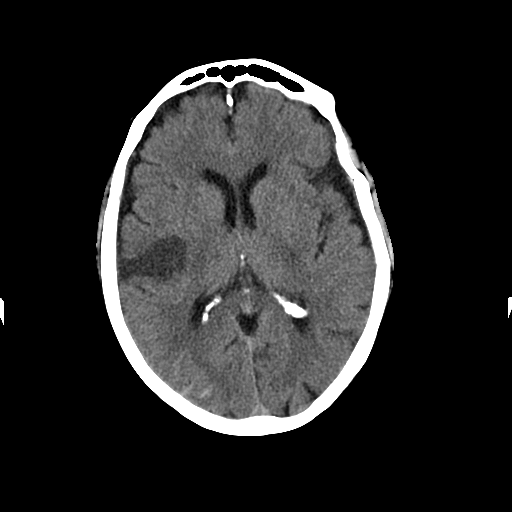
[im 19/33  brain]
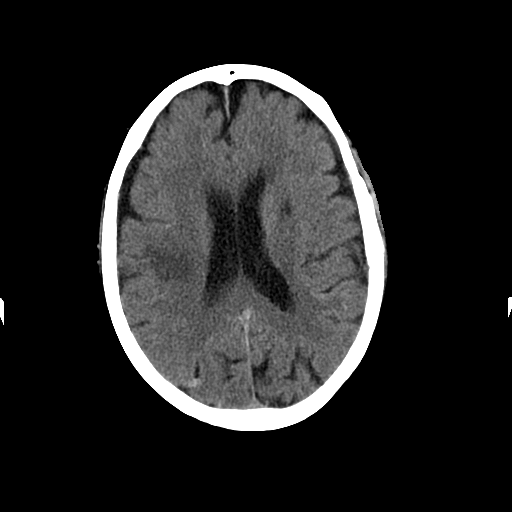
[im 21/33  brain]
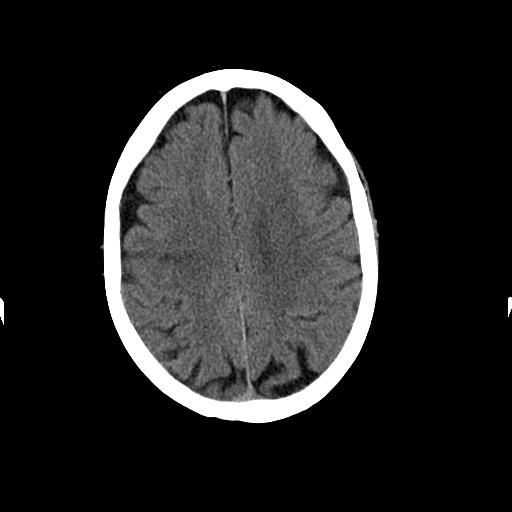
[im 21/33  bone]
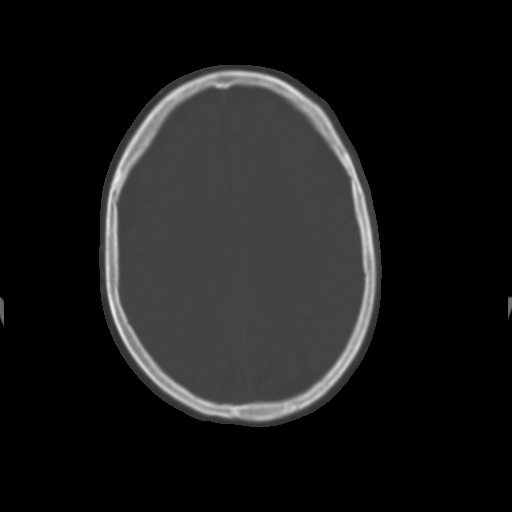
[im 23/33  brain]
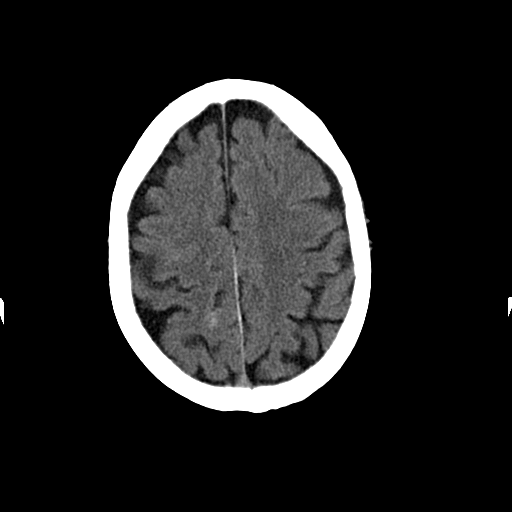
[im 26/33  brain]
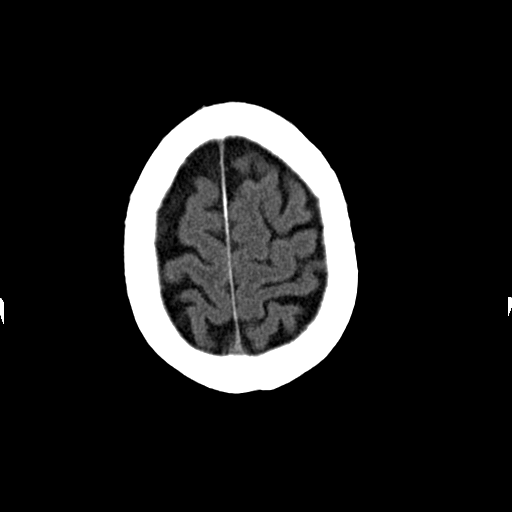
[im 28/33  brain]
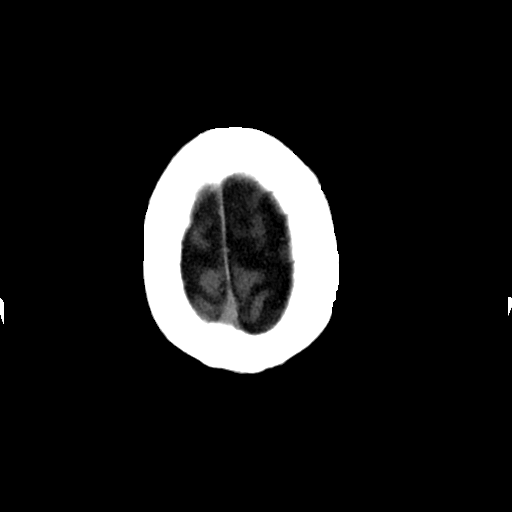
[im 30/33  brain]
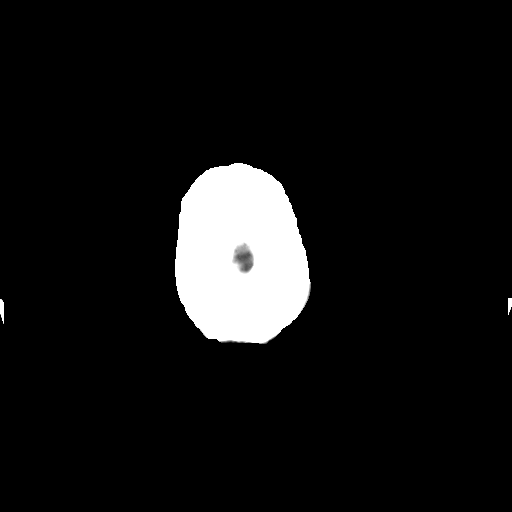
[im 30/33  bone]
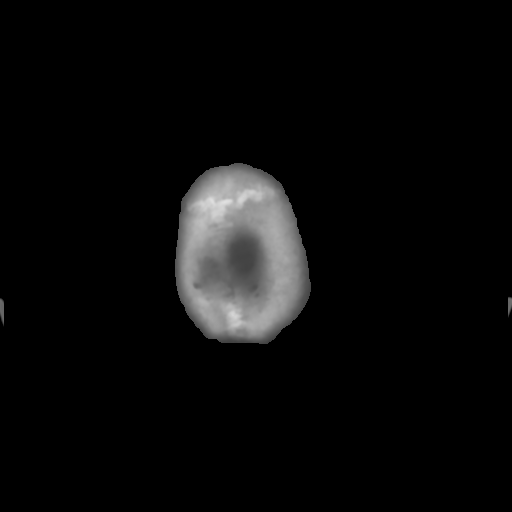

[Series 4: head w/o bone · axial · non-contrast · 0.49mm/px · z∈[+80,+100]mm · 2 of 33 slices shown]
[im 3/33  bone]
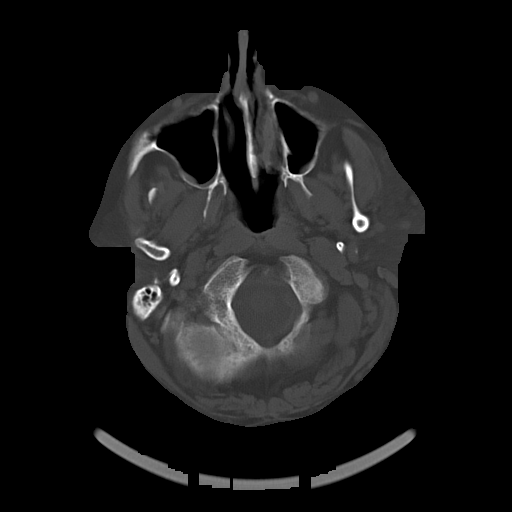
[im 7/33  bone]
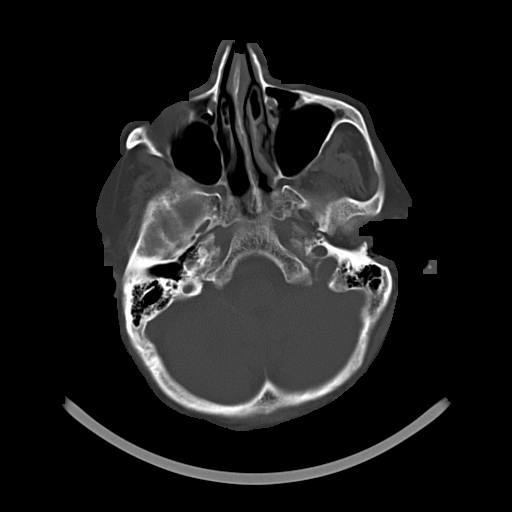

[15 of 30 positions shown; findings below may reference images not displayed]

FINDINGS: Focal hypodensity involving the cortical gray matter and subcortical
white matter of the right superior insula/operculum is again seen,
similar in size and distribution measuring 1.9 x 3.7 cm. Additional
scattered right MCA territory infarcts are similar as well. Hematoma
within the right occipital lobe is slightly decreased in size
measuring approximately 2.5 x 1.8 cm (previously 3.0 x 1.9 cm).
Subarachnoid hemorrhage within the a adjacent right occipital,
parietal, and temporal lobes is decreased in conspicuity, likely
related to redistribution. Trace left-sided subarachnoid is also
decreasing conspicuity. No new intracranial hemorrhage identified.
No extra-axial fluid collection. There is no intraventricular
hemorrhage.

No hydrocephalus.  No significant midline shift.

No new large vessel territory infarct.

Calvarium is intact.  Orbits are normal.

Paranasal sinuses mastoid air cells remain clear.
IMPRESSION: 1. Normal expected interval evolution of scattered right MCA
territory infarcts, most confluent within the operculum.
2. Interval decrease in size of right occipital hematoma, now
measuring approximately 2.5 x 1.8 cm, previously 3.0 x 1.9 cm.
Scattered subarachnoid hemorrhage is also decreased in conspicuity,
likely related to redistribution.
3. No new intracranial hemorrhage or other process identified.
4. No midline shift or hydrocephalus.

## 2014-08-09 ENCOUNTER — Ambulatory Visit (INDEPENDENT_AMBULATORY_CARE_PROVIDER_SITE_OTHER): Payer: Medicare Other | Admitting: *Deleted

## 2014-08-09 DIAGNOSIS — I639 Cerebral infarction, unspecified: Secondary | ICD-10-CM

## 2014-08-09 DIAGNOSIS — I6350 Cerebral infarction due to unspecified occlusion or stenosis of unspecified cerebral artery: Secondary | ICD-10-CM

## 2014-08-28 NOTE — Progress Notes (Signed)
Loop recorder 

## 2014-08-30 ENCOUNTER — Ambulatory Visit (INDEPENDENT_AMBULATORY_CARE_PROVIDER_SITE_OTHER): Payer: Medicare Other | Admitting: *Deleted

## 2014-08-30 ENCOUNTER — Encounter: Payer: Self-pay | Admitting: Internal Medicine

## 2014-08-30 DIAGNOSIS — I639 Cerebral infarction, unspecified: Secondary | ICD-10-CM

## 2014-09-03 ENCOUNTER — Encounter: Payer: Self-pay | Admitting: Internal Medicine

## 2014-09-24 ENCOUNTER — Encounter: Payer: Self-pay | Admitting: Internal Medicine

## 2014-09-24 NOTE — Progress Notes (Signed)
Loop recorder 

## 2014-09-26 ENCOUNTER — Encounter: Payer: Self-pay | Admitting: Neurology

## 2014-09-26 ENCOUNTER — Ambulatory Visit (INDEPENDENT_AMBULATORY_CARE_PROVIDER_SITE_OTHER): Payer: Medicare Other | Admitting: *Deleted

## 2014-09-26 DIAGNOSIS — I639 Cerebral infarction, unspecified: Secondary | ICD-10-CM

## 2014-10-01 LAB — MDC_IDC_ENUM_SESS_TYPE_REMOTE

## 2014-10-24 ENCOUNTER — Encounter (HOSPITAL_COMMUNITY): Payer: Self-pay | Admitting: Internal Medicine

## 2014-10-25 NOTE — Progress Notes (Signed)
Loop recorder 

## 2014-11-12 LAB — MDC_IDC_ENUM_SESS_TYPE_REMOTE

## 2014-11-14 ENCOUNTER — Encounter: Payer: Self-pay | Admitting: Internal Medicine

## 2014-11-18 ENCOUNTER — Ambulatory Visit: Payer: Self-pay | Admitting: *Deleted

## 2014-11-19 ENCOUNTER — Ambulatory Visit: Payer: Medicare Other | Admitting: Nurse Practitioner

## 2014-12-03 ENCOUNTER — Encounter: Payer: Self-pay | Admitting: Internal Medicine

## 2014-12-03 ENCOUNTER — Ambulatory Visit (INDEPENDENT_AMBULATORY_CARE_PROVIDER_SITE_OTHER): Payer: 59 | Admitting: Internal Medicine

## 2014-12-03 VITALS — BP 134/50 | HR 78 | Ht 70.0 in | Wt 203.4 lb

## 2014-12-03 DIAGNOSIS — I639 Cerebral infarction, unspecified: Secondary | ICD-10-CM | POA: Diagnosis not present

## 2014-12-03 DIAGNOSIS — Z4509 Encounter for adjustment and management of other cardiac device: Secondary | ICD-10-CM

## 2014-12-03 LAB — MDC_IDC_ENUM_SESS_TYPE_INCLINIC

## 2014-12-03 NOTE — Patient Instructions (Addendum)
Your physician recommends that you continue on your current medications as directed. Please refer to the Current Medication list given to you today.  Your physician wants you to follow-up in: 1 year with Dr. Rayann Heman in [place].  You will receive a reminder letter in the mail two months in advance. If you don't receive a letter, please call our office to schedule the follow-up appointment.

## 2014-12-03 NOTE — Progress Notes (Signed)
Patient Care Team: Caryl Bis, MD as PCP - General (Unknown Physician Specialty)   HPI  Joshua Horton is a 69 y.o. 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  He has complaints of a loop recorder is moving and that when he bent over a table the other day  There has been some interval improvement in left hand function. It is not normal.   He has had no palpitations. He denies chest pain or shortness of breath or peripheral edema.  Cardiac evaluation at that time included a Myoview scan and echocardiogram both of which were normal.  Past Medical History  Diagnosis Date  . Coronary artery disease   . Wheezing   . Chronic cough   . Peripheral vascular disease   . Hypertension   . Hyperlipidemia   . Reflux   . COPD (chronic obstructive pulmonary disease)   . Myocardial infarction   . CHF (congestive heart failure)   . Embolic cerebral infarction     S/P IV t-PA,  post-tPA hemorrhagic transformation,clinically asymptomatic, scattered right MCA ischemic infarcts, right occipital lobe hemorrhagic infarction with focal hematoma, small volume of right subarachnoid hemorrhage     Past Surgical History  Procedure Laterality Date  . Coronary angioplasty with stent placement  2003  . Cervical disc surgery      Dr Vertell Limber  . Tee without cardioversion N/A 11/19/2013    Procedure: TRANSESOPHAGEAL ECHOCARDIOGRAM (TEE);  Surgeon: Josue Hector, MD;  Location[facility] ENDOSCOPY;  Service: Cardiovascular;  Laterality: N/A;  . Loop recorder implant  11/19/2013    MDT LinQ implanted by Dr Caryl Comes for cryptogenic stroke  . Loop recorder implant N/A 11/19/2013    Procedure: LOOP RECORDER IMPLANT;  Surgeon: Deboraha Sprang, MD;  Location: [facility] CATH LAB;  Service: Cardiovascular;  Laterality: N/A;    Current Outpatient Prescriptions  Medication Sig Dispense Refill  . amLODipine (NORVASC) 5 MG tablet Take 5 mg by mouth daily.    Marland Kitchen aspirin EC 81 MG EC  tablet Take 1 tablet (81 mg total) by mouth daily.    . nitroGLYCERIN (NITROSTAT) 0.4 MG SL tablet Place 0.4 mg under the tongue every 5 (five) minutes as needed for chest pain.    Marland Kitchen omeprazole (PRILOSEC) 20 MG capsule Take 20 mg by mouth daily.    . predniSONE (DELTASONE) 10 MG tablet 6, 5, 4, 3, 2 then 1 tablet by mouth daily for 6 days total. 21 tablet 0  . simvastatin (ZOCOR) 40 MG tablet Take 40 mg by mouth every evening.     No current facility-administered medications for this visit.    Allergies  Allergen Reactions  . Ivp Dye [Iodinated Diagnostic Agents]     Review of Systems negative except from HPI and PMH  Physical Exam BP 134/50 mmHg  Pulse 78  Ht 5\' 10"  (1.778 m)  Wt 203 lb 6.4 oz (92.262 kg)  BMI 29.18 kg/m2 Well developed and well nourished in no acute distress HENT normal E scleral and icterus clear Neck Supple JVP flat;  Device pocket well healed; without hematoma or erythema.  There is no tethering  Clear to ausculation  Regular rate and rhythm, no murmurs gallops or rub Soft with active bowel sounds No clubbing cyanosis none Edema Alert and oriented, grossly normal motor and sensory function although it is clear that his left hand function is not normal Skin Warm and Dry  ECG demonstrates sinus rhythm  Assessment and  Plan  Cryptogenic stroke  Link recorder in place  No interval atrial fibrillation. We'll plan to continue to monitor. We discussed the value of prolonged monitoring for up to 3 years based on the crystal trial

## 2014-12-04 ENCOUNTER — Encounter: Payer: Self-pay | Admitting: Internal Medicine

## 2014-12-07 LAB — MDC_IDC_ENUM_SESS_TYPE_REMOTE

## 2014-12-12 ENCOUNTER — Encounter: Payer: Self-pay | Admitting: Internal Medicine

## 2014-12-18 ENCOUNTER — Ambulatory Visit: Payer: Medicare Other | Admitting: Neurology

## 2014-12-19 ENCOUNTER — Ambulatory Visit: Payer: Medicare Other | Admitting: Neurology

## 2014-12-23 ENCOUNTER — Ambulatory Visit (INDEPENDENT_AMBULATORY_CARE_PROVIDER_SITE_OTHER): Payer: 59 | Admitting: *Deleted

## 2014-12-23 DIAGNOSIS — I639 Cerebral infarction, unspecified: Secondary | ICD-10-CM

## 2014-12-23 DIAGNOSIS — M19041 Primary osteoarthritis, right hand: Secondary | ICD-10-CM | POA: Diagnosis not present

## 2014-12-23 NOTE — Progress Notes (Signed)
Loop recorder 

## 2014-12-26 ENCOUNTER — Encounter: Payer: Self-pay | Admitting: Internal Medicine

## 2014-12-31 ENCOUNTER — Encounter: Payer: Self-pay | Admitting: *Deleted

## 2014-12-31 DIAGNOSIS — I10 Essential (primary) hypertension: Secondary | ICD-10-CM | POA: Diagnosis not present

## 2014-12-31 DIAGNOSIS — I2510 Atherosclerotic heart disease of native coronary artery without angina pectoris: Secondary | ICD-10-CM | POA: Diagnosis not present

## 2014-12-31 DIAGNOSIS — J301 Allergic rhinitis due to pollen: Secondary | ICD-10-CM | POA: Diagnosis not present

## 2014-12-31 DIAGNOSIS — E782 Mixed hyperlipidemia: Secondary | ICD-10-CM | POA: Diagnosis not present

## 2014-12-31 DIAGNOSIS — I739 Peripheral vascular disease, unspecified: Secondary | ICD-10-CM | POA: Diagnosis not present

## 2015-01-05 LAB — MDC_IDC_ENUM_SESS_TYPE_REMOTE

## 2015-01-10 ENCOUNTER — Telehealth: Payer: Self-pay | Admitting: Cardiovascular Disease

## 2015-01-10 NOTE — Telephone Encounter (Signed)
Patients wife called to let us know her husband does not want to see two cardiologist.  He said that Dr Rayann Heman could handle everything.

## 2015-01-17 ENCOUNTER — Ambulatory Visit: Payer: Medicare Other | Admitting: Neurology

## 2015-01-17 ENCOUNTER — Encounter: Payer: Self-pay | Admitting: Cardiology

## 2015-01-17 ENCOUNTER — Encounter: Payer: Self-pay | Admitting: Internal Medicine

## 2015-01-21 ENCOUNTER — Ambulatory Visit (INDEPENDENT_AMBULATORY_CARE_PROVIDER_SITE_OTHER): Payer: Medicare Other | Admitting: *Deleted

## 2015-01-21 DIAGNOSIS — I639 Cerebral infarction, unspecified: Secondary | ICD-10-CM

## 2015-01-22 NOTE — Progress Notes (Signed)
Loop recorder 

## 2015-01-28 ENCOUNTER — Encounter: Payer: Self-pay | Admitting: Vascular Surgery

## 2015-01-29 ENCOUNTER — Encounter: Payer: Self-pay | Admitting: Vascular Surgery

## 2015-01-29 ENCOUNTER — Ambulatory Visit (INDEPENDENT_AMBULATORY_CARE_PROVIDER_SITE_OTHER): Payer: Medicare Other | Admitting: Vascular Surgery

## 2015-01-29 ENCOUNTER — Ambulatory Visit (INDEPENDENT_AMBULATORY_CARE_PROVIDER_SITE_OTHER)
Admission: RE | Admit: 2015-01-29 | Discharge: 2015-01-29 | Disposition: A | Discharge status: Home / Self Care | Payer: Medicare Other | Source: Ambulatory Visit | Attending: Vascular Surgery | Admitting: Vascular Surgery

## 2015-01-29 ENCOUNTER — Ambulatory Visit (HOSPITAL_COMMUNITY)
Admission: RE | Admit: 2015-01-29 | Discharge: 2015-01-29 | Disposition: A | Discharge status: Home / Self Care | Payer: Medicare Other | Source: Ambulatory Visit | Attending: Vascular Surgery | Admitting: Vascular Surgery

## 2015-01-29 VITALS — BP 149/65 | HR 63 | Ht 70.0 in | Wt 201.4 lb

## 2015-01-29 DIAGNOSIS — I70209 Unspecified atherosclerosis of native arteries of extremities, unspecified extremity: Secondary | ICD-10-CM

## 2015-01-29 DIAGNOSIS — I6521 Occlusion and stenosis of right carotid artery: Secondary | ICD-10-CM

## 2015-01-29 DIAGNOSIS — I739 Peripheral vascular disease, unspecified: Secondary | ICD-10-CM | POA: Insufficient documentation

## 2015-01-29 DIAGNOSIS — I6523 Occlusion and stenosis of bilateral carotid arteries: Secondary | ICD-10-CM | POA: Diagnosis not present

## 2015-01-29 NOTE — Progress Notes (Addendum)
[facility] [place]  Patient name: Joshua Horton MRN: [id number] DOB: January 26, 1946 Sex: male  REASON FOR VISIT: Follow up of peripheral vascular disease.  HPI: Joshua Horton is a 69 y.o. 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 In addition when I saw him last he had a mild 40-59% right carotid stenosis. He comes in for a 1 year follow up visit. He has had it remote right brain stroke associated with weakness in the left hand and some paresthesias in the left face. He is on aspirin. He is on a statin.  With respect to his claudication he states that he does not have any significant calf thigh or hip claudication on either side. He has the known left common iliac artery occlusion. He quit smoking in 2014 and tries to walk as much as possible. He denies any history of rest pain. He denies any history of nonhealing ulcers.  Past Medical History  Diagnosis Date  . Coronary artery disease   . Wheezing   . Chronic cough   . Peripheral vascular disease   . Hypertension   . Hyperlipidemia   . Reflux   . COPD (chronic obstructive pulmonary disease)   . Myocardial infarction   . CHF (congestive heart failure)   . Embolic cerebral infarction     S/P IV t-PA,  post-tPA hemorrhagic transformation,clinically asymptomatic, scattered right MCA ischemic infarcts, right occipital lobe hemorrhagic infarction with focal hematoma, small volume of right subarachnoid hemorrhage    Family History  Problem Relation Age of Onset  . Heart disease    . Arthritis    . Cancer    . Kidney disease    . Cancer Mother   . Deep vein thrombosis Father   . Heart disease Father   . Hyperlipidemia Father   . Hypertension Father   . Heart attack Father   . Peripheral vascular disease Father   . Cancer Father   . Heart disease Brother   . Hyperlipidemia Brother   . Heart attack Brother    SOCIAL HISTORY: History  Substance Use Topics  .  Smoking status: Former Smoker -- 1.00 packs/day for 51 years    Types: Cigarettes    Quit date: 04/03/2013  . Smokeless tobacco: Never Used  . Alcohol Use: No   Allergies  Allergen Reactions  . Ivp Dye [Iodinated Diagnostic Agents]    Current Outpatient Prescriptions  Medication Sig Dispense Refill  . amLODipine (NORVASC) 5 MG tablet Take 5 mg by mouth daily.    Marland Kitchen aspirin EC 81 MG EC tablet Take 1 tablet (81 mg total) by mouth daily.    . nitroGLYCERIN (NITROSTAT) 0.4 MG SL tablet Place 0.4 mg under the tongue every 5 (five) minutes as needed for chest pain.    Marland Kitchen omeprazole (PRILOSEC) 20 MG capsule Take 20 mg by mouth daily.    . predniSONE (DELTASONE) 10 MG tablet 6, 5, 4, 3, 2 then 1 tablet by mouth daily for 6 days total. 21 tablet 0  . simvastatin (ZOCOR) 40 MG tablet Take 40 mg by mouth every evening.     No current facility-administered medications for this visit.   REVIEW OF SYSTEMS: Valu.Nieves ] denotes positive finding; [  ] denotes negative finding  CARDIOVASCULAR:  [ ]  chest pain   [ ]  chest pressure   [ ]  palpitations   Valu.Nieves ] orthopnea   Valu.Nieves ] dyspnea on exertion   [ ]   claudication   [ ]  rest pain   [ ]  DVT   [ ]  phlebitis PULMONARY:   Valu.Nieves ] productive cough   [ ]  asthma   Valu.Nieves ] wheezing NEUROLOGIC:   Valu.Nieves ] weakness  [ ]  paresthesias  [ ]  aphasia  [ ]  amaurosis  [ ]  dizziness HEMATOLOGIC:   [ ]  bleeding problems   [ ]  clotting disorders MUSCULOSKELETAL:  [ ]  joint pain   [ ]  joint swelling [ ]  leg swelling GASTROINTESTINAL: [ ]   blood in stool  [ ]   hematemesis GENITOURINARY:  [ ]   dysuria  [ ]   hematuria PSYCHIATRIC:  [ ]  history of major depression INTEGUMENTARY:  Valu.Nieves ] rashes  [ ]  ulcers CONSTITUTIONAL:  [ ]  fever   [ ]  chills  PHYSICAL EXAM: Filed Vitals:   01/29/15 1112 01/29/15 1116  BP: 155/74 149/65  Pulse: 65 63  Height: 5\' 10"  (1.778 m)   Weight: 201 lb 6.4 oz (91.354 kg)   SpO2: 95%    Body mass index is 28.9 kg/(m^2). GENERAL: The patient is a well-nourished  male, in no acute distress. The vital signs are documented above. CARDIOVASCULAR: There is a regular rate and rhythm. I did not detect carotid bruits. He has a palpable right femoral pulse. I cannot palpate a left femoral pulse. I cannot palpate pedal pulses. He has mild bilateral lower extremity swelling. PULMONARY: There is good air exchange bilaterally without wheezing or rales. ABDOMEN: Soft and non-tender with normal pitched bowel sounds.  MUSCULOSKELETAL: There are no major deformities or cyanosis. NEUROLOGIC: No focal weakness or paresthesias are detected. SKIN: There are no ulcers or rashes noted. PSYCHIATRIC: The patient has a normal affect.  DATA:  I have independently interpreted his arterial Doppler study today which shows a biphasic posterior tibial signal on the right with a monophasic dorsalis pedis signal. ABI on the right is 82% which is stable compared to 78% a year ago. He has monophasic Doppler signals in the left foot in the dorsalis pedis and posterior tibial positions. ABI on the left is 71% which is increased slightly compared to a year ago when it was 64%.  I have independently interpreted his carotid duplex scan today which shows that he has a less than 40% left carotid stenosis. He has a 40-59% right carotid stenosis. This has not changed.  MEDICAL ISSUES:  PERIPHERAL VASCULAR DISEASE: He has a known left common iliac artery occlusion. However at this point he is asymptomatic. He is on aspirin and is on a statin. He quit smoking. I've encouraged him to stay as active as possible. Aborted follow up ABIs in 1 year and he will see Vinnie Level Nickel at that time. He knows to call sooner if he has problems.  RIGHT CAROTID STENOSIS: he has a 40-59% right carotid stenosis with no significant stenosis on the left. I ordered a follow up carotid duplex scan in 1 year. He is on aspirin and is on a statin.  Return in about 1 year (around 01/29/2016).   [place] [facility] [place] Beeper: [phone]

## 2015-01-30 ENCOUNTER — Other Ambulatory Visit: Payer: Self-pay | Admitting: *Deleted

## 2015-01-30 DIAGNOSIS — I739 Peripheral vascular disease, unspecified: Secondary | ICD-10-CM

## 2015-01-30 DIAGNOSIS — I6523 Occlusion and stenosis of bilateral carotid arteries: Secondary | ICD-10-CM

## 2015-02-05 LAB — MDC_IDC_ENUM_SESS_TYPE_REMOTE
MDC IDC SESS DTM: 20160310233049
Zone Setting Detection Interval: 2000 ms
Zone Setting Detection Interval: 3000 ms
Zone Setting Detection Interval: 370 ms

## 2015-02-06 IMAGING — CR DG FINGER THUMB 2+V*R*
1 series · 1 of 1 positions shown · non-contrast
Comparison: None.

CLINICAL DATA: Atraumatic metacarpophalangeal joint pain

EXAM:
RIGHT THUMB 2+V

[view not recorded]
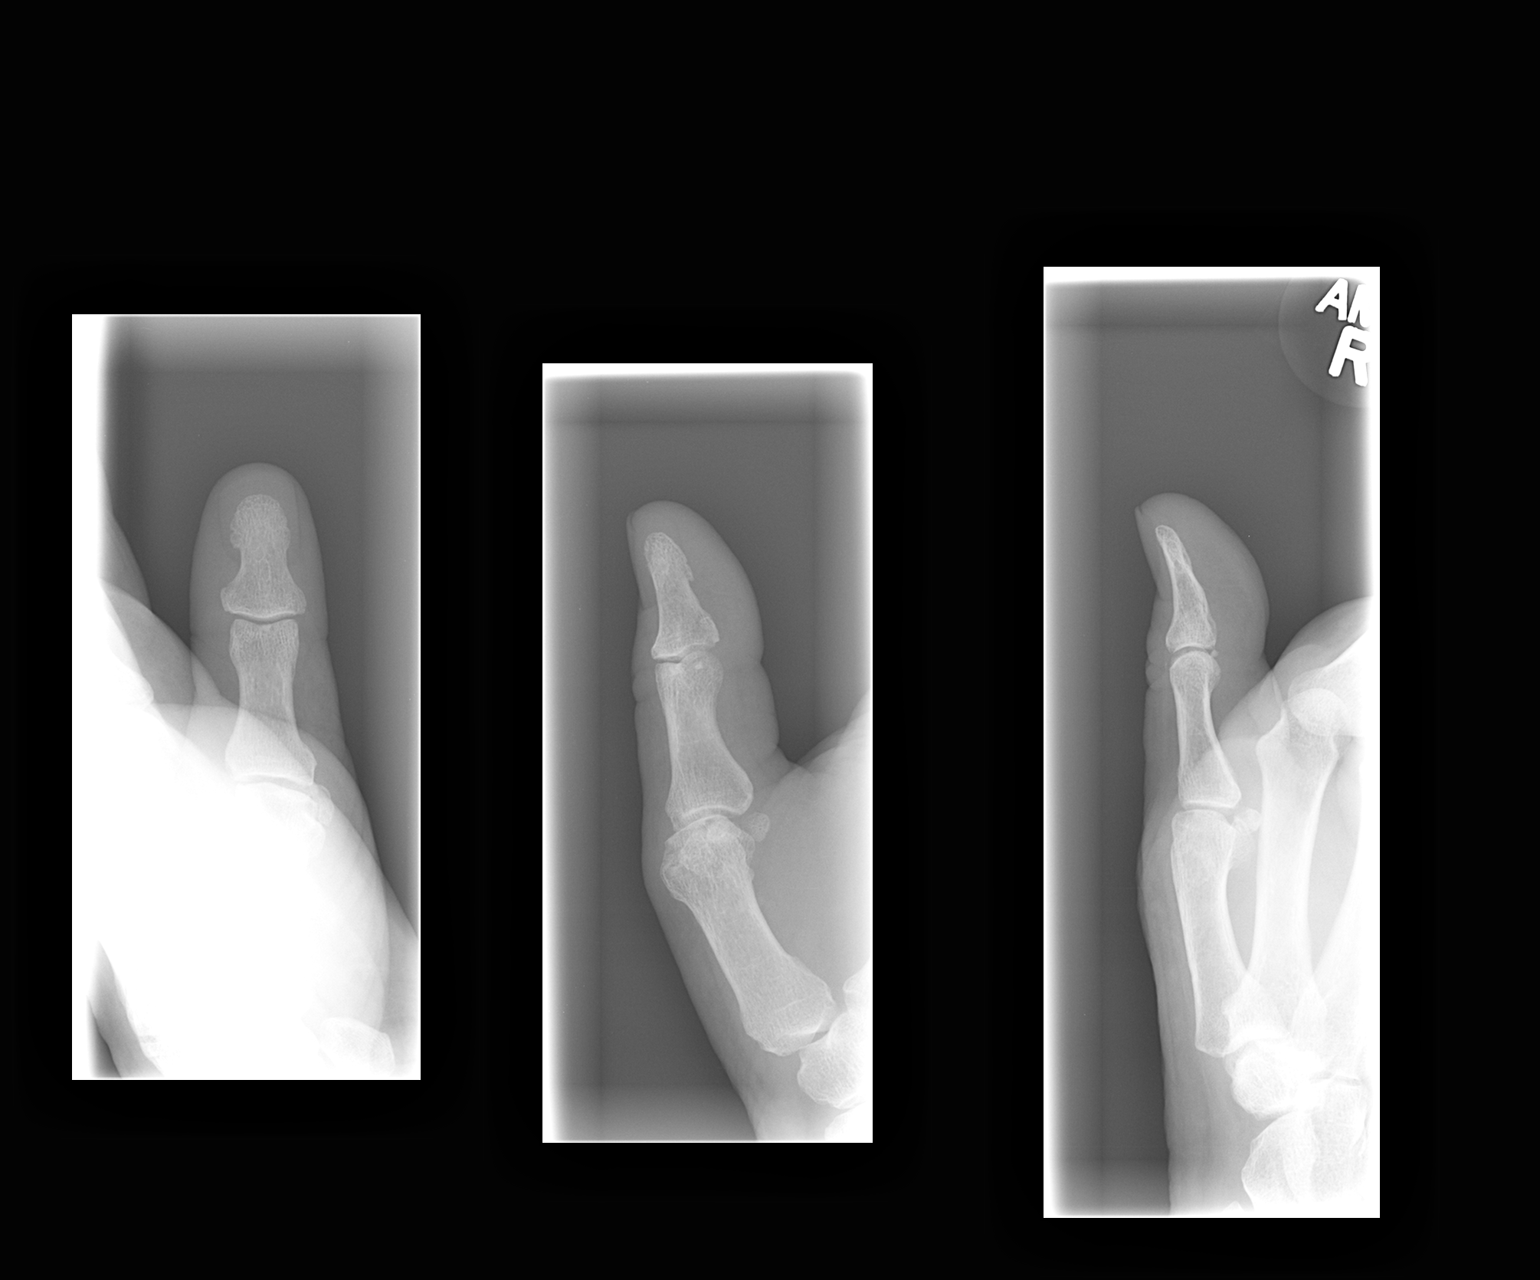

[1 of 1 positions shown; findings below may reference images not displayed]

FINDINGS: The bones of the thumb are adequately mineralized. There is mild
degenerative change at the metacarpophalangeal joint and also at the
carpometacarpal joint. The interphalangeal joint is normal. The
overlying soft tissues are normal.
IMPRESSION: There are mild degenerative changes. Of the first carpometacarpal
and the first metacarpophalangeal joint.

## 2015-02-19 ENCOUNTER — Encounter: Payer: Self-pay | Admitting: Internal Medicine

## 2015-02-20 ENCOUNTER — Ambulatory Visit (INDEPENDENT_AMBULATORY_CARE_PROVIDER_SITE_OTHER): Payer: Medicare Other | Admitting: *Deleted

## 2015-02-20 DIAGNOSIS — I639 Cerebral infarction, unspecified: Secondary | ICD-10-CM | POA: Diagnosis not present

## 2015-02-24 NOTE — Progress Notes (Signed)
Loop recorder 

## 2015-03-21 ENCOUNTER — Ambulatory Visit (INDEPENDENT_AMBULATORY_CARE_PROVIDER_SITE_OTHER): Payer: Medicare Other | Admitting: *Deleted

## 2015-03-21 DIAGNOSIS — I639 Cerebral infarction, unspecified: Secondary | ICD-10-CM | POA: Diagnosis not present

## 2015-03-21 LAB — CUP PACEART REMOTE DEVICE CHECK: Date Time Interrogation Session: 20160506110704

## 2015-03-28 ENCOUNTER — Encounter: Payer: Self-pay | Admitting: Internal Medicine

## 2015-03-28 NOTE — Progress Notes (Signed)
Loop recorder 

## 2015-04-10 LAB — CUP PACEART REMOTE DEVICE CHECK: Date Time Interrogation Session: 20160526135814

## 2015-04-21 ENCOUNTER — Ambulatory Visit (INDEPENDENT_AMBULATORY_CARE_PROVIDER_SITE_OTHER): Payer: Medicare Other | Admitting: *Deleted

## 2015-04-21 DIAGNOSIS — I639 Cerebral infarction, unspecified: Secondary | ICD-10-CM | POA: Diagnosis not present

## 2015-04-23 NOTE — Progress Notes (Signed)
Loop recorder 

## 2015-04-25 ENCOUNTER — Encounter: Payer: Self-pay | Admitting: Internal Medicine

## 2015-05-01 LAB — CUP PACEART REMOTE DEVICE CHECK: Date Time Interrogation Session: 20160616121014

## 2015-05-13 ENCOUNTER — Encounter: Payer: Self-pay | Admitting: Internal Medicine

## 2015-05-15 DIAGNOSIS — L219 Seborrheic dermatitis, unspecified: Secondary | ICD-10-CM | POA: Diagnosis not present

## 2015-05-15 DIAGNOSIS — G8112 Spastic hemiplegia affecting left dominant side: Secondary | ICD-10-CM | POA: Diagnosis not present

## 2015-05-15 DIAGNOSIS — I739 Peripheral vascular disease, unspecified: Secondary | ICD-10-CM | POA: Diagnosis not present

## 2015-05-15 DIAGNOSIS — R7301 Impaired fasting glucose: Secondary | ICD-10-CM | POA: Diagnosis not present

## 2015-05-15 DIAGNOSIS — J301 Allergic rhinitis due to pollen: Secondary | ICD-10-CM | POA: Diagnosis not present

## 2015-05-15 DIAGNOSIS — K219 Gastro-esophageal reflux disease without esophagitis: Secondary | ICD-10-CM | POA: Diagnosis not present

## 2015-05-15 DIAGNOSIS — I2510 Atherosclerotic heart disease of native coronary artery without angina pectoris: Secondary | ICD-10-CM | POA: Diagnosis not present

## 2015-05-15 DIAGNOSIS — J449 Chronic obstructive pulmonary disease, unspecified: Secondary | ICD-10-CM | POA: Diagnosis not present

## 2015-05-21 ENCOUNTER — Ambulatory Visit (INDEPENDENT_AMBULATORY_CARE_PROVIDER_SITE_OTHER): Payer: Medicare Other | Admitting: *Deleted

## 2015-05-21 DIAGNOSIS — I639 Cerebral infarction, unspecified: Secondary | ICD-10-CM

## 2015-05-21 NOTE — Progress Notes (Signed)
Loop recorder 

## 2015-05-21 NOTE — Addendum Note (Signed)
Addended by: Patsey Berthold on: 05/21/2015 05:24 PM   Modules accepted: Level of Service

## 2015-05-22 ENCOUNTER — Telehealth: Payer: Self-pay | Admitting: Cardiology

## 2015-05-22 ENCOUNTER — Encounter: Payer: Self-pay | Admitting: Cardiology

## 2015-05-22 NOTE — Telephone Encounter (Signed)
Spoke w/ pt wife and instructed her to send manual transmission. Transmission received.

## 2015-05-22 NOTE — Telephone Encounter (Signed)
-----   Message from Patsey Berthold, NP sent at 05/21/2015  5:22 PM EDT ----- He hasn't transmitted since 6-19- we can't bill for his summary report 7-6.  I will no charge him. Can you call and ask him to send transmission?  Thank you!

## 2015-05-26 DIAGNOSIS — M1711 Unilateral primary osteoarthritis, right knee: Secondary | ICD-10-CM | POA: Diagnosis not present

## 2015-05-26 DIAGNOSIS — M7050 Other bursitis of knee, unspecified knee: Secondary | ICD-10-CM | POA: Diagnosis not present

## 2015-06-09 DIAGNOSIS — S83241A Other tear of medial meniscus, current injury, right knee, initial encounter: Secondary | ICD-10-CM | POA: Diagnosis not present

## 2015-06-09 DIAGNOSIS — M6751 Plica syndrome, right knee: Secondary | ICD-10-CM | POA: Diagnosis not present

## 2015-06-20 ENCOUNTER — Ambulatory Visit (INDEPENDENT_AMBULATORY_CARE_PROVIDER_SITE_OTHER): Payer: Medicare Other | Admitting: *Deleted

## 2015-06-20 DIAGNOSIS — I639 Cerebral infarction, unspecified: Secondary | ICD-10-CM | POA: Diagnosis not present

## 2015-06-20 NOTE — Progress Notes (Signed)
Loop recorder 

## 2015-06-24 LAB — CUP PACEART REMOTE DEVICE CHECK: MDC IDC SESS DTM: 20160806040500

## 2015-07-02 DIAGNOSIS — M6751 Plica syndrome, right knee: Secondary | ICD-10-CM | POA: Diagnosis not present

## 2015-07-02 DIAGNOSIS — S83241A Other tear of medial meniscus, current injury, right knee, initial encounter: Secondary | ICD-10-CM | POA: Diagnosis not present

## 2015-07-03 ENCOUNTER — Encounter: Payer: Self-pay | Admitting: Internal Medicine

## 2015-07-10 ENCOUNTER — Encounter: Payer: Self-pay | Admitting: Internal Medicine

## 2015-07-18 ENCOUNTER — Ambulatory Visit (INDEPENDENT_AMBULATORY_CARE_PROVIDER_SITE_OTHER): Payer: Medicare Other | Admitting: *Deleted

## 2015-07-18 DIAGNOSIS — I639 Cerebral infarction, unspecified: Secondary | ICD-10-CM

## 2015-07-23 NOTE — Progress Notes (Signed)
Loop recorder 

## 2015-07-31 ENCOUNTER — Encounter: Payer: Self-pay | Admitting: Cardiology

## 2015-08-06 ENCOUNTER — Encounter: Payer: Self-pay | Admitting: Cardiology

## 2015-08-07 ENCOUNTER — Telehealth: Payer: Self-pay | Admitting: Internal Medicine

## 2015-08-07 NOTE — Telephone Encounter (Signed)
New Message  Pt received past due letter to send in remote transmission. Pt wife stated he will send it in but has further questions. Please call back and disucss.

## 2015-08-07 NOTE — Telephone Encounter (Signed)
Instructed pt wife to send a manual transmission. And informed her that the monitor may become disconnected b/c of power outages / if the pt is away from the monitor for more than a week, or if the monitor has been unplugged. She verbalized understanding.

## 2015-08-13 LAB — CUP PACEART REMOTE DEVICE CHECK: Date Time Interrogation Session: 20160904073833

## 2015-08-13 NOTE — Progress Notes (Signed)
Carelink summary report received. Battery status OK. Normal device function. No new symptom, tachy, brady, or pause episodes. 10 AF episodes--available EGM suggests SR w/PACs, +ASA 81mg . Monthly summary reports and ROV with JA in [place] in 11/2015.

## 2015-08-19 ENCOUNTER — Ambulatory Visit (INDEPENDENT_AMBULATORY_CARE_PROVIDER_SITE_OTHER): Payer: Medicare Other | Admitting: *Deleted

## 2015-08-19 DIAGNOSIS — I639 Cerebral infarction, unspecified: Secondary | ICD-10-CM

## 2015-08-19 NOTE — Progress Notes (Signed)
Loop recorder 

## 2015-08-21 ENCOUNTER — Encounter: Payer: Self-pay | Admitting: Cardiology

## 2015-08-23 LAB — CUP PACEART REMOTE DEVICE CHECK: MDC IDC SESS DTM: 20161004073650

## 2015-08-23 NOTE — Progress Notes (Signed)
Carelink summary report received. Battery status OK. Normal device function. No new symptom episodes, tachy episodes, brady, or pause episodes. No new AF episodes. Monthly summary reports and ROV w/ JA/E 1/17.

## 2015-08-27 ENCOUNTER — Encounter: Payer: Self-pay | Admitting: Cardiology

## 2015-09-04 ENCOUNTER — Encounter: Payer: Self-pay | Admitting: Internal Medicine

## 2015-09-05 ENCOUNTER — Encounter: Payer: Self-pay | Admitting: Internal Medicine

## 2015-09-18 ENCOUNTER — Ambulatory Visit (INDEPENDENT_AMBULATORY_CARE_PROVIDER_SITE_OTHER): Payer: Medicare Other | Admitting: *Deleted

## 2015-09-18 DIAGNOSIS — I639 Cerebral infarction, unspecified: Secondary | ICD-10-CM

## 2015-09-18 NOTE — Progress Notes (Signed)
Loop recorder 

## 2015-10-19 LAB — CUP PACEART REMOTE DEVICE CHECK: Date Time Interrogation Session: 20161103073636

## 2015-10-19 NOTE — Progress Notes (Addendum)
Carelink summary report received.

## 2015-10-20 ENCOUNTER — Ambulatory Visit (INDEPENDENT_AMBULATORY_CARE_PROVIDER_SITE_OTHER): Payer: Medicare Other | Admitting: *Deleted

## 2015-10-20 DIAGNOSIS — I639 Cerebral infarction, unspecified: Secondary | ICD-10-CM | POA: Diagnosis not present

## 2015-10-20 NOTE — Progress Notes (Signed)
Carelink Summary Report / Loop Recorder 

## 2015-11-18 ENCOUNTER — Ambulatory Visit (INDEPENDENT_AMBULATORY_CARE_PROVIDER_SITE_OTHER): Payer: Medicare Other | Admitting: *Deleted

## 2015-11-18 DIAGNOSIS — I639 Cerebral infarction, unspecified: Secondary | ICD-10-CM

## 2015-11-18 NOTE — Progress Notes (Signed)
Carelink Summary Report / Loop Recorder 

## 2015-12-02 DIAGNOSIS — G8112 Spastic hemiplegia affecting left dominant side: Secondary | ICD-10-CM | POA: Diagnosis not present

## 2015-12-02 DIAGNOSIS — I739 Peripheral vascular disease, unspecified: Secondary | ICD-10-CM | POA: Diagnosis not present

## 2015-12-02 DIAGNOSIS — I10 Essential (primary) hypertension: Secondary | ICD-10-CM | POA: Diagnosis not present

## 2015-12-02 DIAGNOSIS — Z23 Encounter for immunization: Secondary | ICD-10-CM | POA: Diagnosis not present

## 2015-12-02 DIAGNOSIS — E782 Mixed hyperlipidemia: Secondary | ICD-10-CM | POA: Diagnosis not present

## 2015-12-02 DIAGNOSIS — I2510 Atherosclerotic heart disease of native coronary artery without angina pectoris: Secondary | ICD-10-CM | POA: Diagnosis not present

## 2015-12-02 DIAGNOSIS — R7301 Impaired fasting glucose: Secondary | ICD-10-CM | POA: Diagnosis not present

## 2015-12-04 ENCOUNTER — Encounter: Payer: Self-pay | Admitting: Internal Medicine

## 2015-12-04 ENCOUNTER — Ambulatory Visit (INDEPENDENT_AMBULATORY_CARE_PROVIDER_SITE_OTHER): Payer: Medicare Other | Admitting: Internal Medicine

## 2015-12-04 VITALS — BP 138/70 | HR 62 | Ht 70.0 in | Wt 204.4 lb

## 2015-12-04 DIAGNOSIS — I639 Cerebral infarction, unspecified: Secondary | ICD-10-CM

## 2015-12-04 NOTE — Patient Instructions (Signed)
Medication Instructions: - no changes  Labwork: - none  Procedures/Testing: - none  Follow-Up: - Your physician wants you to follow-up in: 1 year with Dr. Caryl Comes. You will receive a reminder letter in the mail two months in advance. If you don't receive a letter, please call our office to schedule the follow-up appointment.  Any Additional Special Instructions Will Be Listed Below (If Applicable).     If you need a refill on your cardiac medications before your next appointment, please call your pharmacy.

## 2015-12-04 NOTE — Progress Notes (Signed)
Patient Care Team: Caryl Bis, MD as PCP - General (Unknown Physician Specialty)   HPI  Joshua Horton is a 70 y.o. 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  He has complaints of motion and movement related to his device  He has complaints of a loop recorder is moving and that when he bent over a table the other day  There has been some interval improvement in left hand function. It is not normal.   He has had no palpitations. He denies chest pain or shortness of breath or peripheral edema.  Cardiac evaluation at that time included a Myoview scan and echocardiogram both of which were normal.  Past Medical History  Diagnosis Date  . Coronary artery disease   . Wheezing   . Chronic cough   . Peripheral vascular disease ([place])   . Hypertension   . Hyperlipidemia   . Reflux   . COPD (chronic obstructive pulmonary disease) ([place])   . Myocardial infarction ([place])   . CHF (congestive heart failure) ([place])   . 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 Coronary angioplasty with stent placement  2003  . Cervical disc surgery      Dr Vertell Limber  . Tee without cardioversion N/A 11/19/2013    Procedure: TRANSESOPHAGEAL ECHOCARDIOGRAM (TEE);  Surgeon: Josue Hector, MD;  Location: [facility] ENDOSCOPY;  Service: Cardiovascular;  Laterality: N/A;  . Loop recorder implant  11/19/2013    MDT LinQ implanted by Dr Caryl Comes for cryptogenic stroke  . Loop recorder implant N/A 11/19/2013    Procedure: LOOP RECORDER IMPLANT;  Surgeon: Deboraha Sprang, MD;  Location: [facility] CATH LAB;  Service: Cardiovascular;  Laterality: N/A;    Current Outpatient Prescriptions  Medication Sig Dispense  Refill  . amLODipine (NORVASC) 5 MG tablet Take 5 mg by mouth daily.    Marland Kitchen aspirin EC 81 MG EC tablet Take 1 tablet (81 mg total) by mouth daily.    . nitroGLYCERIN (NITROSTAT) 0.4 MG SL tablet Place 0.4 mg under the tongue every 5 (five) minutes as needed for chest pain.    Marland Kitchen omeprazole (PRILOSEC) 20 MG capsule Take 20 mg by mouth daily.    . predniSONE (DELTASONE) 10 MG tablet 6, 5, 4, 3, 2 then 1 tablet by mouth daily for 6 days total. 21 tablet 0  . simvastatin (ZOCOR) 40 MG tablet Take 40 mg by mouth every evening.     No current facility-administered medications for this visit.    Allergies  Allergen Reactions  . Ivp Dye [Iodinated Diagnostic Agents]     Review of Systems negative except from HPI and PMH  Physical Exam BP 138/70 mmHg  Pulse 62  Ht 5\' 10"  (1.778 m)  Wt 204 lb 6.4 oz (92.715 kg)  BMI 29.33 kg/m2 Well developed and well nourished in no acute distress HENT normal E scleral and icterus clear Neck Supple JVP flat;  Device pocket well healed; without hematoma or erythema.  There is no tethering  Clear to ausculation  Regular rate and rhythm, no murmurs gallops or rub Soft with active bowel sounds No clubbing cyanosis none Edema Alert and oriented, grossly normal motor and sensory function although it is clear that  his left hand function is not normal Skin Warm and Dry  ECG demonstrates sinus rhythm  Assessment and  Plan  Cryptogenic stroke  Link recorder in place     No intercurrent atrial fibrillation or flutter  Continue monitoring

## 2015-12-16 LAB — CUP PACEART REMOTE DEVICE CHECK: MDC IDC SESS DTM: 20161203080727

## 2015-12-17 ENCOUNTER — Ambulatory Visit (INDEPENDENT_AMBULATORY_CARE_PROVIDER_SITE_OTHER): Payer: Medicare Other | Admitting: *Deleted

## 2015-12-17 DIAGNOSIS — I639 Cerebral infarction, unspecified: Secondary | ICD-10-CM | POA: Diagnosis not present

## 2015-12-17 NOTE — Progress Notes (Signed)
Carelink Summary Report / Loop Recorder 

## 2015-12-24 LAB — CUP PACEART REMOTE DEVICE CHECK: MDC IDC SESS DTM: 20170102083808

## 2015-12-24 LAB — CUP PACEART INCLINIC DEVICE CHECK: MDC IDC SESS DTM: 20170208111927

## 2016-01-16 ENCOUNTER — Ambulatory Visit (INDEPENDENT_AMBULATORY_CARE_PROVIDER_SITE_OTHER): Payer: Medicare Other | Admitting: *Deleted

## 2016-01-16 DIAGNOSIS — I639 Cerebral infarction, unspecified: Secondary | ICD-10-CM

## 2016-01-16 NOTE — Progress Notes (Signed)
Carelink Summary Report / Loop Recorder 

## 2016-01-20 LAB — CUP PACEART REMOTE DEVICE CHECK: MDC IDC SESS DTM: 20170201083601

## 2016-01-20 NOTE — Progress Notes (Signed)
Carelink summary report received. Battery status OK. Normal device function. No new symptom episodes, tachy episodes, brady, or pause episodes. No new AF episodes. Monthly summary reports and ROV/PRN 

## 2016-01-23 LAB — CUP PACEART REMOTE DEVICE CHECK: MDC IDC SESS DTM: 20170303090553

## 2016-01-23 NOTE — Progress Notes (Signed)
Carelink summary report received. Battery status OK. Normal device function. No new symptom episodes, tachy episodes, brady, or pause episodes. No new AF episodes. Monthly summary reports and ROV/PRN 

## 2016-01-28 ENCOUNTER — Encounter: Payer: Self-pay | Admitting: Family

## 2016-02-04 ENCOUNTER — Other Ambulatory Visit: Payer: Self-pay | Admitting: *Deleted

## 2016-02-04 ENCOUNTER — Ambulatory Visit (HOSPITAL_COMMUNITY)
Admission: RE | Admit: 2016-02-04 | Discharge: 2016-02-04 | Disposition: A | Discharge status: Home / Self Care | Payer: Medicare Other | Source: Ambulatory Visit | Attending: Family | Admitting: Family

## 2016-02-04 ENCOUNTER — Other Ambulatory Visit: Payer: Self-pay | Admitting: Vascular Surgery

## 2016-02-04 ENCOUNTER — Ambulatory Visit (INDEPENDENT_AMBULATORY_CARE_PROVIDER_SITE_OTHER)
Admission: RE | Admit: 2016-02-04 | Discharge: 2016-02-04 | Disposition: A | Discharge status: Home / Self Care | Payer: Medicare Other | Source: Ambulatory Visit | Attending: Family | Admitting: Family

## 2016-02-04 ENCOUNTER — Ambulatory Visit (INDEPENDENT_AMBULATORY_CARE_PROVIDER_SITE_OTHER): Payer: Medicare Other | Admitting: Family

## 2016-02-04 ENCOUNTER — Encounter: Payer: Self-pay | Admitting: Family

## 2016-02-04 VITALS — BP 134/72 | HR 63 | Temp 97.3°F | Resp 18 | Ht 70.0 in | Wt 207.0 lb

## 2016-02-04 DIAGNOSIS — I6521 Occlusion and stenosis of right carotid artery: Secondary | ICD-10-CM

## 2016-02-04 DIAGNOSIS — E785 Hyperlipidemia, unspecified: Secondary | ICD-10-CM | POA: Insufficient documentation

## 2016-02-04 DIAGNOSIS — I110 Hypertensive heart disease with heart failure: Secondary | ICD-10-CM | POA: Insufficient documentation

## 2016-02-04 DIAGNOSIS — I70209 Unspecified atherosclerosis of native arteries of extremities, unspecified extremity: Secondary | ICD-10-CM | POA: Diagnosis not present

## 2016-02-04 DIAGNOSIS — J449 Chronic obstructive pulmonary disease, unspecified: Secondary | ICD-10-CM | POA: Insufficient documentation

## 2016-02-04 DIAGNOSIS — I6523 Occlusion and stenosis of bilateral carotid arteries: Secondary | ICD-10-CM

## 2016-02-04 DIAGNOSIS — I739 Peripheral vascular disease, unspecified: Secondary | ICD-10-CM | POA: Diagnosis not present

## 2016-02-04 DIAGNOSIS — I779 Disorder of arteries and arterioles, unspecified: Secondary | ICD-10-CM | POA: Diagnosis present

## 2016-02-04 DIAGNOSIS — I252 Old myocardial infarction: Secondary | ICD-10-CM | POA: Insufficient documentation

## 2016-02-04 DIAGNOSIS — I509 Heart failure, unspecified: Secondary | ICD-10-CM | POA: Diagnosis not present

## 2016-02-04 NOTE — Patient Instructions (Signed)
Stroke Prevention Some medical conditions and behaviors are associated with an increased chance of having a stroke. You may prevent a stroke by making healthy choices and managing medical conditions. HOW CAN I REDUCE MY RISK OF HAVING A STROKE?   Stay physically active. Get at least 30 minutes of activity on most or all days.  Do not smoke. It may also be helpful to avoid exposure to secondhand smoke.  Limit alcohol use. Moderate alcohol use is considered to be:  No more than 2 drinks per day for men.  No more than 1 drink per day for nonpregnant women.  Eat healthy foods. This involves:  Eating 5 or more servings of fruits and vegetables a day.  Making dietary changes that address high blood pressure (hypertension), high cholesterol, diabetes, or obesity.  Manage your cholesterol levels.  Making food choices that are high in fiber and low in saturated fat, trans fat, and cholesterol may control cholesterol levels.  Take any prescribed medicines to control cholesterol as directed by your health care provider.  Manage your diabetes.  Controlling your carbohydrate and sugar intake is recommended to manage diabetes.  Take any prescribed medicines to control diabetes as directed by your health care provider.  Control your hypertension.  Making food choices that are low in salt (sodium), saturated fat, trans fat, and cholesterol is recommended to manage hypertension.  Ask your health care provider if you need treatment to lower your blood pressure. Take any prescribed medicines to control hypertension as directed by your health care provider.  If you are 18-39 years of age, have your blood pressure checked every 3-5 years. If you are 40 years of age or older, have your blood pressure checked every year.  Maintain a healthy weight.  Reducing calorie intake and making food choices that are low in sodium, saturated fat, trans fat, and cholesterol are recommended to manage  weight.  Stop drug abuse.  Avoid taking birth control pills.  Talk to your health care provider about the risks of taking birth control pills if you are over 35 years old, smoke, get migraines, or have ever had a blood clot.  Get evaluated for sleep disorders (sleep apnea).  Talk to your health care provider about getting a sleep evaluation if you snore a lot or have excessive sleepiness.  Take medicines only as directed by your health care provider.  For some people, aspirin or blood thinners (anticoagulants) are helpful in reducing the risk of forming abnormal blood clots that can lead to stroke. If you have the irregular heart rhythm of atrial fibrillation, you should be on a blood thinner unless there is a good reason you cannot take them.  Understand all your medicine instructions.  Make sure that other conditions (such as anemia or atherosclerosis) are addressed. SEEK IMMEDIATE MEDICAL CARE IF:   You have sudden weakness or numbness of the face, arm, or leg, especially on one side of the body.  Your face or eyelid droops to one side.  You have sudden confusion.  You have trouble speaking (aphasia) or understanding.  You have sudden trouble seeing in one or both eyes.  You have sudden trouble walking.  You have dizziness.  You have a loss of balance or coordination.  You have a sudden, severe headache with no known cause.  You have new chest pain or an irregular heartbeat. Any of these symptoms may represent a serious problem that is an emergency. Do not wait to see if the symptoms will   go away. Get medical help at once. Call your local emergency services (911 in [place] Do not drive yourself to the hospital.   This information is not intended to replace advice given to you by your health care provider. Make sure you discuss any questions you have with your health care provider.   Document Released: 12/09/2004 Document Revised: 11/22/2014 Document Reviewed:  05/04/2013 Elsevier Interactive Patient Education 2016 Elsevier Inc.    Peripheral Vascular Disease Peripheral vascular disease (PVD) is a disease of the blood vessels that are not part of your heart and brain. A simple term for PVD is poor circulation. In most cases, PVD narrows the blood vessels that carry blood from your heart to the rest of your body. This can result in a decreased supply of blood to your arms, legs, and internal organs, like your stomach or kidneys. However, it most often affects a person's lower legs and feet. There are two types of PVD.  Organic PVD. This is the more common type. It is caused by damage to the structure of blood vessels.  Functional PVD. This is caused by conditions that make blood vessels contract and tighten (spasm). Without treatment, PVD tends to get worse over time. PVD can also lead to acute ischemic limb. This is when an arm or limb suddenly has trouble getting enough blood. This is a medical emergency. CAUSES Each type of PVD has many different causes. The most common cause of PVD is buildup of a fatty material (plaque) inside of your arteries (atherosclerosis). Small amounts of plaque can break off from the walls of the blood vessels and become lodged in a smaller artery. This blocks blood flow and can cause acute ischemic limb. Other common causes of PVD include:  Blood clots that form inside of blood vessels.  Injuries to blood vessels.  Diseases that cause inflammation of blood vessels or cause blood vessel spasms.  Health behaviors and health history that increase your risk of developing PVD. RISK FACTORS  You may have a greater risk of PVD if you:  Have a family history of PVD.  Have certain medical conditions, including:  High cholesterol.  Diabetes.  High blood pressure (hypertension).  Coronary heart disease.  Past problems with blood clots.  Past injury, such as burns or a broken bone. These may have damaged blood  vessels in your limbs.  Buerger disease. This is caused by inflamed blood vessels in your hands and feet.  Some forms of arthritis.  Rare birth defects that affect the arteries in your legs.  Use tobacco.  Do not get enough exercise.  Are obese.  Are age 50 or older. SIGNS AND SYMPTOMS  PVD may cause many different symptoms. Your symptoms depend on what part of your body is not getting enough blood. Some common signs and symptoms include:  Cramps in your lower legs. This may be a symptom of poor leg circulation (claudication).  Pain and weakness in your legs while you are physically active that goes away when you rest (intermittent claudication).  Leg pain when at rest.  Leg numbness, tingling, or weakness.  Coldness in a leg or foot, especially when compared with the other leg.  Skin or hair changes. These can include:  Hair loss.  Shiny skin.  Pale or bluish skin.  Thick toenails.  Inability to get or maintain an erection (erectile dysfunction). People with PVD are more prone to developing ulcers and sores on their toes, feet, or legs. These may take longer than   normal to heal. DIAGNOSIS Your health care provider may diagnose PVD from your signs and symptoms. The health care provider will also do a physical exam. You may have tests to find out what is causing your PVD and determine its severity. Tests may include:  Blood pressure recordings from your arms and legs and measurements of the strength of your pulses (pulse volume recordings).  Imaging studies using sound waves to take pictures of the blood flow through your blood vessels (Doppler ultrasound).  Injecting a dye into your blood vessels before having imaging studies using:  X-rays (angiogram or arteriogram).  Computer-generated X-rays (CT angiogram).  A powerful electromagnetic field and a computer (magnetic resonance angiogram or MRA). TREATMENT Treatment for PVD depends on the cause of your condition  and the severity of your symptoms. It also depends on your age. Underlying causes need to be treated and controlled. These include long-lasting (chronic) conditions, such as diabetes, high cholesterol, and high blood pressure. You may need to first try making lifestyle changes and taking medicines. Surgery may be needed if these do not work. Lifestyle changes may include:  Quitting smoking.  Exercising regularly.  Following a low-fat, low-cholesterol diet. Medicines may include:  Blood thinners to prevent blood clots.  Medicines to improve blood flow.  Medicines to improve your blood cholesterol levels. Surgical procedures may include:  A procedure that uses an inflated balloon to open a blocked artery and improve blood flow (angioplasty).  A procedure to put in a tube (stent) to keep a blocked artery open (stent implant).  Surgery to reroute blood flow around a blocked artery (peripheral bypass surgery).  Surgery to remove dead tissue from an infected wound on the affected limb.  Amputation. This is surgical removal of the affected limb. This may be necessary in cases of acute ischemic limb that are not improved through medical or surgical treatments. HOME CARE INSTRUCTIONS  Take medicines only as directed by your health care provider.  Do not use any tobacco products, including cigarettes, chewing tobacco, or electronic cigarettes. If you need help quitting, ask your health care provider.  Lose weight if you are overweight, and maintain a healthy weight as directed by your health care provider.  Eat a diet that is low in fat and cholesterol. If you need help, ask your health care provider.  Exercise regularly. Ask your health care provider to suggest some good activities for you.  Use compression stockings or other mechanical devices as directed by your health care provider.  Take good care of your feet.  Wear comfortable shoes that fit well.  Check your feet often for  any cuts or sores. SEEK MEDICAL CARE IF:  You have cramps in your legs while walking.  You have leg pain when you are at rest.  You have coldness in a leg or foot.  Your skin changes.  You have erectile dysfunction.  You have cuts or sores on your feet that are not healing. SEEK IMMEDIATE MEDICAL CARE IF:  Your arm or leg turns cold and blue.  Your arms or legs become red, warm, swollen, painful, or numb.  You have chest pain or trouble breathing.  You suddenly have weakness in your face, arm, or leg.  You become very confused or lose the ability to speak.  You suddenly have a very bad headache or lose your vision.   This information is not intended to replace advice given to you by your health care provider. Make sure you discuss any questions   you have with your health care provider.   Document Released: 12/09/2004 Document Revised: 11/22/2014 Document Reviewed: 04/11/2014 Elsevier Interactive Patient Education 2016 Elsevier Inc.  

## 2016-02-04 NOTE — Progress Notes (Signed)
[facility]  HISTORY AND PHYSICAL   MRN : [id number]  History of Present Illness:   Joshua Horton is a 70 y.o. male patient of Dr. 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 In addition when Dr. Scot Dock saw him last he had a mild 40-59% right carotid stenosis. He comes in for a 1 year follow up visit. He has had a right brain stroke in 2014 associated with weakness in the left hand and some paresthesias in the left face, and slurred speach. He denies any subsequent neurological event. He is on aspirin. He is on a statin.  With respect to his claudication he states that he does not have any significant calf thigh or hip claudication on either side. He has the known left common iliac artery occlusion. He quit smoking in 2014 and tries to walk as much as possible. He denies any history of rest pain. He denies any history of nonhealing ulcers.  Pt states that his dyspnea limits his walking.  He has an implanted loop recorder.   Pt Diabetic: No Pt smoker: former smoker, quit in 2014  Pt meds include: Statin :Yes ASA: Yes Other anticoagulants/antiplatelets: no  Current Outpatient Prescriptions  Medication Sig Dispense Refill  . amLODipine (NORVASC) 5 MG tablet Take 5 mg by mouth daily.    Marland Kitchen aspirin EC 81 MG EC tablet Take 1 tablet (81 mg total) by mouth daily.    . nitroGLYCERIN (NITROSTAT) 0.4 MG SL tablet Place 0.4 mg under the tongue every 5 (five) minutes as needed for chest pain.    Marland Kitchen omeprazole (PRILOSEC) 20 MG capsule Take 20 mg by mouth daily.    . predniSONE (DELTASONE) 10 MG tablet 6, 5, 4, 3, 2 then 1 tablet by mouth daily for 6 days total. 21 tablet 0  . simvastatin (ZOCOR) 40 MG tablet Take 40 mg by mouth every evening.     No current facility-administered medications for this visit.    Past Medical History  Diagnosis Date  . Coronary artery disease   . Wheezing   . Chronic cough   .  Peripheral vascular disease ([place])   . Hypertension   . Hyperlipidemia   . Reflux   . COPD (chronic obstructive pulmonary disease) ([place])   . Myocardial infarction ([place])   . CHF (congestive heart failure) ([place])   . Embolic cerebral infarction (HCC)     S/P IV t-PA,  post-tPA hemorrhagic transformation,clinically asymptomatic, scattered right MCA ischemic infarcts, right occipital lobe hemorrhagic infarction with focal hematoma, small volume of right subarachnoid hemorrhage     Social History Social History  Substance Use Topics  . Smoking status: Former Smoker -- 1.00 packs/day for 51 years    Types: Cigarettes    Quit date: 04/03/2013  . Smokeless tobacco: Never Used  . Alcohol Use: No    Family History Family History  Problem Relation Age of Onset  . Heart disease    . Arthritis    . Cancer    . Kidney disease    . Cancer Mother   . Deep vein thrombosis Father   . Heart disease Father   . Hyperlipidemia Father   . Hypertension Father   . Heart attack Father   . Peripheral vascular disease Father   . Cancer Father   . Heart disease Brother   . Hyperlipidemia Brother   . Heart attack Brother     Surgical History Past Surgical  History  Procedure Laterality Date  . Coronary angioplasty with stent placement  2003  . Cervical disc surgery      Dr Vertell Limber  . Tee without cardioversion N/A 11/19/2013    Procedure: TRANSESOPHAGEAL ECHOCARDIOGRAM (TEE);  Surgeon: Josue Hector, MD;  Location: [facility] ENDOSCOPY;  Service: Cardiovascular;  Laterality: N/A;  . Loop recorder implant  11/19/2013    MDT LinQ implanted by Dr Caryl Comes for cryptogenic stroke  . Loop recorder implant N/A 11/19/2013    Procedure: LOOP RECORDER IMPLANT;  Surgeon: Deboraha Sprang, MD;  Location: [facility] CATH LAB;  Service: Cardiovascular;  Laterality: N/A;    Allergies  Allergen Reactions  . Ivp Dye [Iodinated Diagnostic Agents]     Current Outpatient Prescriptions  Medication Sig Dispense Refill  . amLODipine  (NORVASC) 5 MG tablet Take 5 mg by mouth daily.    Marland Kitchen aspirin EC 81 MG EC tablet Take 1 tablet (81 mg total) by mouth daily.    . nitroGLYCERIN (NITROSTAT) 0.4 MG SL tablet Place 0.4 mg under the tongue every 5 (five) minutes as needed for chest pain.    Marland Kitchen omeprazole (PRILOSEC) 20 MG capsule Take 20 mg by mouth daily.    . predniSONE (DELTASONE) 10 MG tablet 6, 5, 4, 3, 2 then 1 tablet by mouth daily for 6 days total. 21 tablet 0  . simvastatin (ZOCOR) 40 MG tablet Take 40 mg by mouth every evening.     No current facility-administered medications for this visit.     REVIEW OF SYSTEMS: See HPI for pertinent positives and negatives.  Physical Examination Filed Vitals:   02/04/16 1127  BP: 134/72  Pulse: 63  Temp: 97.3 F (36.3 C)  Resp: 18  Height: 5\' 10"  (1.778 m)  Weight: 207 lb (93.895 kg)  SpO2: 94%   Body mass index is 29.7 kg/(m^2).  General:  WDWN in NAD Gait: Normal HENT: WNL Eyes: Pupils equal, left lower eyelid ectropian Pulmonary: normal non-labored breathing, no rales, rhonchi, or wheezing Cardiac: RRR, no murmur detected  Abdomen: soft, NT, no masses palpated Skin: no rashes, no ulcers;  no cellulitis.  VASCULAR EXAM  Carotid Bruits Right Left   Negative Negative    Aorta is not palpable Radial pulses are 2+ palpable                         VASCULAR EXAM: Extremities without ischemic changes, without Gangrene; without open wounds.                                                                                                          LE Pulses Right Left       FEMORAL  2+ palpable  not palpable        POPLITEAL  not palpable   not palpable       POSTERIOR TIBIAL  not palpable   not palpable        DORSALIS PEDIS      ANTERIOR TIBIAL 2+ palpable  not palpable  Musculoskeletal: no muscle wasting or atrophy; no peripheral edema  Neurologic: A&O X 3; Appropriate Affect ;  SENSATION: normal; MOTOR FUNCTION: 5/5 Symmetric except for 4/5 left  hand grip, CN 2-12 intact except speech is slightly slurred Speech is fluent/normal    Non-Invasive Vascular Imaging: (02/04/2016):  Carotid Duplex: Right ICA: 40-59% stenosis Left ICA: <40% stenosis No significant change compared to prior exam  ABI  R: 0.76 (0.82, 01/29/15), DP: triphasic, PT: triphasic, TBI: 0.43  L: 0.63 (0.71), DP: monophasic, PT: monophasic, TBI: 0.63   ASSESSMENT:  Joshua Horton is a 70 y.o. 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 In addition when Dr. Scot Dock saw him last he had a mild 40-59% right carotid stenosis. He comes in for a 1 year follow up visit. He has had a right brain stroke in 2014 associated with weakness in the left hand and some paresthesias in the left face, and slurred speech, no subsequent neurological event.  He is on aspirin. He is on a statin.  With respect to his claudication he states that he does not have any significant calf thigh or hip claudication on either side. He has the known left common iliac artery occlusion. He quit smoking in 2014 and tries to walk as much as possible. He denies any history of rest pain. He denies any history of nonhealing ulcers.  Pt states that his dyspnea limits his walking.  Today's carotid duplex remains stable: Right ICA: 40-59% stenosis, Left ICA: <40% stenosis   ABI's have worsened slightly bilaterally: both with moderate arterial occlusive disease.    PLAN:   Graduated walking program discussed in detail  Based on today's exam and non-invasive vascular lab results, the patient will follow up in 1 year with the following tests: carotid duplex and ABI's. I discussed in depth with the patient the nature of atherosclerosis, and emphasized the importance of maximal medical management including strict control of blood pressure, blood glucose, and lipid levels, obtaining regular exercise, and cessation of smoking.  The patient is aware that without  maximal medical management the underlying atherosclerotic disease process will progress, limiting the benefit of any interventions.  The patient was given information about stroke prevention and what symptoms should prompt the patient to seek immediate medical care.  The patient was given information about PAD including signs, symptoms, treatment, what symptoms should prompt the patient to seek immediate medical care, and risk reduction measures to take. Thank you for allowing [place] to participate in this patient's care.  Clemon Chambers, RN, MSN, FNP-C [facility] Office: [phone]  Clinic MD: Scot Dock  02/04/2016 11:35 AM

## 2016-02-06 DIAGNOSIS — H10502 Unspecified blepharoconjunctivitis, left eye: Secondary | ICD-10-CM | POA: Diagnosis not present

## 2016-02-06 DIAGNOSIS — H11003 Unspecified pterygium of eye, bilateral: Secondary | ICD-10-CM | POA: Diagnosis not present

## 2016-02-06 DIAGNOSIS — H1045 Other chronic allergic conjunctivitis: Secondary | ICD-10-CM | POA: Diagnosis not present

## 2016-02-16 ENCOUNTER — Ambulatory Visit (INDEPENDENT_AMBULATORY_CARE_PROVIDER_SITE_OTHER): Payer: Medicare Other | Admitting: *Deleted

## 2016-02-16 DIAGNOSIS — I639 Cerebral infarction, unspecified: Secondary | ICD-10-CM

## 2016-02-16 NOTE — Progress Notes (Signed)
Carelink Summary Report / Loop Recorder 

## 2016-03-16 ENCOUNTER — Ambulatory Visit (INDEPENDENT_AMBULATORY_CARE_PROVIDER_SITE_OTHER): Payer: Medicare Other | Admitting: *Deleted

## 2016-03-16 DIAGNOSIS — I639 Cerebral infarction, unspecified: Secondary | ICD-10-CM | POA: Diagnosis not present

## 2016-03-16 NOTE — Progress Notes (Signed)
Carelink Summary Report / Loop Recorder 

## 2016-04-10 LAB — CUP PACEART REMOTE DEVICE CHECK: MDC IDC SESS DTM: 20170402093559

## 2016-04-10 NOTE — Progress Notes (Signed)
Carelink summary report received. Battery status OK. Normal device function. No new symptom episodes, tachy episodes, brady, or pause episodes. No new AF episodes. Monthly summary reports and ROV/PRN 

## 2016-04-15 ENCOUNTER — Ambulatory Visit (INDEPENDENT_AMBULATORY_CARE_PROVIDER_SITE_OTHER): Payer: Medicare Other | Admitting: *Deleted

## 2016-04-15 DIAGNOSIS — I639 Cerebral infarction, unspecified: Secondary | ICD-10-CM | POA: Diagnosis not present

## 2016-04-15 NOTE — Progress Notes (Signed)
Carelink Summary Report / Loop Recorder 

## 2016-04-25 LAB — CUP PACEART REMOTE DEVICE CHECK: Date Time Interrogation Session: 20170502100703

## 2016-04-25 NOTE — Progress Notes (Signed)
Carelink summary report received. Battery status OK. Normal device function. No new symptom episodes, tachy episodes, brady, or pause episodes. No new AF episodes. Monthly summary reports and ROV/PRN 

## 2016-05-17 ENCOUNTER — Ambulatory Visit (INDEPENDENT_AMBULATORY_CARE_PROVIDER_SITE_OTHER): Payer: Medicare Other | Admitting: *Deleted

## 2016-05-17 DIAGNOSIS — I639 Cerebral infarction, unspecified: Secondary | ICD-10-CM | POA: Diagnosis not present

## 2016-05-17 NOTE — Progress Notes (Signed)
Carelink Summary Report / Loop Recorder 

## 2016-05-24 LAB — CUP PACEART REMOTE DEVICE CHECK: Date Time Interrogation Session: 20170701103719

## 2016-05-25 LAB — CUP PACEART REMOTE DEVICE CHECK: MDC IDC SESS DTM: 20170601103642

## 2016-06-14 ENCOUNTER — Ambulatory Visit (INDEPENDENT_AMBULATORY_CARE_PROVIDER_SITE_OTHER): Payer: Medicare Other | Admitting: *Deleted

## 2016-06-14 DIAGNOSIS — I639 Cerebral infarction, unspecified: Secondary | ICD-10-CM

## 2016-06-14 NOTE — Progress Notes (Signed)
Carelink Summary Report / Loop Recorder 

## 2016-06-16 DIAGNOSIS — H02115 Cicatricial ectropion of left lower eyelid: Secondary | ICD-10-CM | POA: Diagnosis not present

## 2016-06-16 DIAGNOSIS — H2513 Age-related nuclear cataract, bilateral: Secondary | ICD-10-CM | POA: Diagnosis not present

## 2016-06-21 DIAGNOSIS — L218 Other seborrheic dermatitis: Secondary | ICD-10-CM | POA: Diagnosis not present

## 2016-06-21 DIAGNOSIS — L210 Seborrhea capitis: Secondary | ICD-10-CM | POA: Diagnosis not present

## 2016-06-21 DIAGNOSIS — L820 Inflamed seborrheic keratosis: Secondary | ICD-10-CM | POA: Diagnosis not present

## 2016-06-21 LAB — CUP PACEART REMOTE DEVICE CHECK: Date Time Interrogation Session: 20170731103726

## 2016-07-06 DIAGNOSIS — Z87891 Personal history of nicotine dependence: Secondary | ICD-10-CM | POA: Diagnosis not present

## 2016-07-06 DIAGNOSIS — Z91041 Radiographic dye allergy status: Secondary | ICD-10-CM | POA: Diagnosis not present

## 2016-07-06 DIAGNOSIS — M7989 Other specified soft tissue disorders: Secondary | ICD-10-CM | POA: Diagnosis not present

## 2016-07-06 DIAGNOSIS — H02115 Cicatricial ectropion of left lower eyelid: Secondary | ICD-10-CM | POA: Diagnosis not present

## 2016-07-06 DIAGNOSIS — R093 Abnormal sputum: Secondary | ICD-10-CM | POA: Diagnosis not present

## 2016-07-06 DIAGNOSIS — R12 Heartburn: Secondary | ICD-10-CM | POA: Diagnosis not present

## 2016-07-06 DIAGNOSIS — Z9889 Other specified postprocedural states: Secondary | ICD-10-CM | POA: Diagnosis not present

## 2016-07-06 DIAGNOSIS — R002 Palpitations: Secondary | ICD-10-CM | POA: Diagnosis not present

## 2016-07-06 DIAGNOSIS — Z7982 Long term (current) use of aspirin: Secondary | ICD-10-CM | POA: Diagnosis not present

## 2016-07-06 DIAGNOSIS — K5900 Constipation, unspecified: Secondary | ICD-10-CM | POA: Diagnosis not present

## 2016-07-06 DIAGNOSIS — H2513 Age-related nuclear cataract, bilateral: Secondary | ICD-10-CM | POA: Diagnosis not present

## 2016-07-06 DIAGNOSIS — R202 Paresthesia of skin: Secondary | ICD-10-CM | POA: Diagnosis not present

## 2016-07-06 DIAGNOSIS — R062 Wheezing: Secondary | ICD-10-CM | POA: Diagnosis not present

## 2016-07-06 DIAGNOSIS — L299 Pruritus, unspecified: Secondary | ICD-10-CM | POA: Diagnosis not present

## 2016-07-06 DIAGNOSIS — Z923 Personal history of irradiation: Secondary | ICD-10-CM | POA: Diagnosis not present

## 2016-07-06 DIAGNOSIS — R21 Rash and other nonspecific skin eruption: Secondary | ICD-10-CM | POA: Diagnosis not present

## 2016-07-06 DIAGNOSIS — M2550 Pain in unspecified joint: Secondary | ICD-10-CM | POA: Diagnosis not present

## 2016-07-06 DIAGNOSIS — H538 Other visual disturbances: Secondary | ICD-10-CM | POA: Diagnosis not present

## 2016-07-06 DIAGNOSIS — R238 Other skin changes: Secondary | ICD-10-CM | POA: Diagnosis not present

## 2016-07-06 DIAGNOSIS — R450 Nervousness: Secondary | ICD-10-CM | POA: Diagnosis not present

## 2016-07-06 DIAGNOSIS — H269 Unspecified cataract: Secondary | ICD-10-CM | POA: Diagnosis not present

## 2016-07-06 DIAGNOSIS — Z8673 Personal history of transient ischemic attack (TIA), and cerebral infarction without residual deficits: Secondary | ICD-10-CM | POA: Diagnosis not present

## 2016-07-06 DIAGNOSIS — Z79899 Other long term (current) drug therapy: Secondary | ICD-10-CM | POA: Diagnosis not present

## 2016-07-14 ENCOUNTER — Ambulatory Visit (INDEPENDENT_AMBULATORY_CARE_PROVIDER_SITE_OTHER): Payer: Medicare Other | Admitting: *Deleted

## 2016-07-14 DIAGNOSIS — I639 Cerebral infarction, unspecified: Secondary | ICD-10-CM

## 2016-07-14 NOTE — Progress Notes (Signed)
Carelink Summary Report / Loop Recorder 

## 2016-07-16 ENCOUNTER — Encounter: Payer: Self-pay | Admitting: Internal Medicine

## 2016-08-07 LAB — CUP PACEART REMOTE DEVICE CHECK: Date Time Interrogation Session: 20170830103556

## 2016-08-07 NOTE — Progress Notes (Signed)
Carelink summary report received. Battery status OK. Normal device function. No new symptom episodes, tachy episodes, brady, or pause episodes. 1 AF episode, SR with PAC's. Monthly summary reports and ROV/PRN

## 2016-08-09 DIAGNOSIS — I10 Essential (primary) hypertension: Secondary | ICD-10-CM | POA: Diagnosis not present

## 2016-08-09 DIAGNOSIS — Z87891 Personal history of nicotine dependence: Secondary | ICD-10-CM | POA: Diagnosis not present

## 2016-08-09 DIAGNOSIS — I69354 Hemiplegia and hemiparesis following cerebral infarction affecting left non-dominant side: Secondary | ICD-10-CM | POA: Diagnosis not present

## 2016-08-09 DIAGNOSIS — R0683 Snoring: Secondary | ICD-10-CM | POA: Diagnosis not present

## 2016-08-09 DIAGNOSIS — I739 Peripheral vascular disease, unspecified: Secondary | ICD-10-CM | POA: Diagnosis not present

## 2016-08-09 DIAGNOSIS — K219 Gastro-esophageal reflux disease without esophagitis: Secondary | ICD-10-CM | POA: Diagnosis not present

## 2016-08-09 DIAGNOSIS — E785 Hyperlipidemia, unspecified: Secondary | ICD-10-CM | POA: Diagnosis not present

## 2016-08-09 DIAGNOSIS — Z981 Arthrodesis status: Secondary | ICD-10-CM | POA: Diagnosis not present

## 2016-08-09 DIAGNOSIS — Z7982 Long term (current) use of aspirin: Secondary | ICD-10-CM | POA: Diagnosis not present

## 2016-08-09 DIAGNOSIS — Z01811 Encounter for preprocedural respiratory examination: Secondary | ICD-10-CM | POA: Diagnosis not present

## 2016-08-09 DIAGNOSIS — I2510 Atherosclerotic heart disease of native coronary artery without angina pectoris: Secondary | ICD-10-CM | POA: Diagnosis not present

## 2016-08-09 DIAGNOSIS — Z9861 Coronary angioplasty status: Secondary | ICD-10-CM | POA: Diagnosis not present

## 2016-08-09 DIAGNOSIS — I252 Old myocardial infarction: Secondary | ICD-10-CM | POA: Diagnosis not present

## 2016-08-09 DIAGNOSIS — R0989 Other specified symptoms and signs involving the circulatory and respiratory systems: Secondary | ICD-10-CM | POA: Diagnosis not present

## 2016-08-09 DIAGNOSIS — R739 Hyperglycemia, unspecified: Secondary | ICD-10-CM | POA: Diagnosis not present

## 2016-08-09 DIAGNOSIS — H02115 Cicatricial ectropion of left lower eyelid: Secondary | ICD-10-CM | POA: Diagnosis not present

## 2016-08-09 DIAGNOSIS — Z9889 Other specified postprocedural states: Secondary | ICD-10-CM | POA: Diagnosis not present

## 2016-08-09 DIAGNOSIS — R9431 Abnormal electrocardiogram [ECG] [EKG]: Secondary | ICD-10-CM | POA: Diagnosis not present

## 2016-08-09 DIAGNOSIS — J449 Chronic obstructive pulmonary disease, unspecified: Secondary | ICD-10-CM | POA: Diagnosis not present

## 2016-08-12 DIAGNOSIS — Z0001 Encounter for general adult medical examination with abnormal findings: Secondary | ICD-10-CM | POA: Diagnosis not present

## 2016-08-12 DIAGNOSIS — K219 Gastro-esophageal reflux disease without esophagitis: Secondary | ICD-10-CM | POA: Diagnosis not present

## 2016-08-12 DIAGNOSIS — I2510 Atherosclerotic heart disease of native coronary artery without angina pectoris: Secondary | ICD-10-CM | POA: Diagnosis not present

## 2016-08-12 DIAGNOSIS — Z23 Encounter for immunization: Secondary | ICD-10-CM | POA: Diagnosis not present

## 2016-08-12 DIAGNOSIS — I739 Peripheral vascular disease, unspecified: Secondary | ICD-10-CM | POA: Diagnosis not present

## 2016-08-12 DIAGNOSIS — J449 Chronic obstructive pulmonary disease, unspecified: Secondary | ICD-10-CM | POA: Diagnosis not present

## 2016-08-13 ENCOUNTER — Ambulatory Visit (INDEPENDENT_AMBULATORY_CARE_PROVIDER_SITE_OTHER): Payer: Medicare Other | Admitting: *Deleted

## 2016-08-13 DIAGNOSIS — I639 Cerebral infarction, unspecified: Secondary | ICD-10-CM

## 2016-08-13 NOTE — Progress Notes (Signed)
Carelink Summary Report / Loop Recorder 

## 2016-08-17 DIAGNOSIS — L820 Inflamed seborrheic keratosis: Secondary | ICD-10-CM | POA: Diagnosis not present

## 2016-09-01 DIAGNOSIS — I252 Old myocardial infarction: Secondary | ICD-10-CM | POA: Diagnosis not present

## 2016-09-01 DIAGNOSIS — K219 Gastro-esophageal reflux disease without esophagitis: Secondary | ICD-10-CM | POA: Diagnosis not present

## 2016-09-01 DIAGNOSIS — Z87891 Personal history of nicotine dependence: Secondary | ICD-10-CM | POA: Diagnosis not present

## 2016-09-01 DIAGNOSIS — I2510 Atherosclerotic heart disease of native coronary artery without angina pectoris: Secondary | ICD-10-CM | POA: Diagnosis not present

## 2016-09-01 DIAGNOSIS — Z9861 Coronary angioplasty status: Secondary | ICD-10-CM | POA: Diagnosis not present

## 2016-09-01 DIAGNOSIS — R739 Hyperglycemia, unspecified: Secondary | ICD-10-CM | POA: Diagnosis not present

## 2016-09-01 DIAGNOSIS — H02115 Cicatricial ectropion of left lower eyelid: Secondary | ICD-10-CM | POA: Diagnosis not present

## 2016-09-01 DIAGNOSIS — E785 Hyperlipidemia, unspecified: Secondary | ICD-10-CM | POA: Diagnosis not present

## 2016-09-01 DIAGNOSIS — Z7982 Long term (current) use of aspirin: Secondary | ICD-10-CM | POA: Diagnosis not present

## 2016-09-01 DIAGNOSIS — Z9889 Other specified postprocedural states: Secondary | ICD-10-CM | POA: Diagnosis not present

## 2016-09-01 DIAGNOSIS — I69354 Hemiplegia and hemiparesis following cerebral infarction affecting left non-dominant side: Secondary | ICD-10-CM | POA: Diagnosis not present

## 2016-09-01 DIAGNOSIS — Z981 Arthrodesis status: Secondary | ICD-10-CM | POA: Diagnosis not present

## 2016-09-01 DIAGNOSIS — I739 Peripheral vascular disease, unspecified: Secondary | ICD-10-CM | POA: Diagnosis not present

## 2016-09-01 DIAGNOSIS — J449 Chronic obstructive pulmonary disease, unspecified: Secondary | ICD-10-CM | POA: Diagnosis not present

## 2016-09-08 DIAGNOSIS — L218 Other seborrheic dermatitis: Secondary | ICD-10-CM | POA: Diagnosis not present

## 2016-09-08 DIAGNOSIS — L304 Erythema intertrigo: Secondary | ICD-10-CM | POA: Diagnosis not present

## 2016-09-09 DIAGNOSIS — Z9889 Other specified postprocedural states: Secondary | ICD-10-CM | POA: Diagnosis not present

## 2016-09-09 DIAGNOSIS — Z87891 Personal history of nicotine dependence: Secondary | ICD-10-CM | POA: Diagnosis not present

## 2016-09-09 DIAGNOSIS — Z7982 Long term (current) use of aspirin: Secondary | ICD-10-CM | POA: Diagnosis not present

## 2016-09-13 ENCOUNTER — Ambulatory Visit (INDEPENDENT_AMBULATORY_CARE_PROVIDER_SITE_OTHER): Payer: Medicare Other | Admitting: *Deleted

## 2016-09-13 DIAGNOSIS — I639 Cerebral infarction, unspecified: Secondary | ICD-10-CM

## 2016-09-13 NOTE — Progress Notes (Signed)
Carelink Summary Report / Loop Recorder 

## 2016-09-24 LAB — CUP PACEART REMOTE DEVICE CHECK
Implantable Pulse Generator Implant Date: 20150105
MDC IDC SESS DTM: 20170929113600

## 2016-09-24 NOTE — Progress Notes (Signed)
Carelink summary report received. Battery status OK. Normal device function. No new symptom episodes, tachy episodes, brady, or pause episodes. No new AF episodes. Monthly summary reports and ROV/PRN 

## 2016-10-12 ENCOUNTER — Ambulatory Visit (INDEPENDENT_AMBULATORY_CARE_PROVIDER_SITE_OTHER): Payer: Medicare Other | Admitting: *Deleted

## 2016-10-12 DIAGNOSIS — I639 Cerebral infarction, unspecified: Secondary | ICD-10-CM | POA: Diagnosis not present

## 2016-10-13 NOTE — Progress Notes (Signed)
Carelink Summary Report / Loop Recorder 

## 2016-10-21 LAB — CUP PACEART REMOTE DEVICE CHECK
Implantable Pulse Generator Implant Date: 20150105
MDC IDC SESS DTM: 20171029113600

## 2016-10-21 NOTE — Progress Notes (Signed)
Carelink summary report received. Battery status OK. Normal device function. No new symptom episodes, tachy episodes, brady, or pause episodes. No new AF episodes. Monthly summary reports and ROV/PRN 

## 2016-11-11 ENCOUNTER — Ambulatory Visit (INDEPENDENT_AMBULATORY_CARE_PROVIDER_SITE_OTHER): Payer: Medicare Other | Admitting: *Deleted

## 2016-11-11 DIAGNOSIS — I639 Cerebral infarction, unspecified: Secondary | ICD-10-CM

## 2016-11-11 NOTE — Progress Notes (Signed)
Carelink Summary Report / Loop Recorder 

## 2016-11-22 LAB — CUP PACEART REMOTE DEVICE CHECK
Date Time Interrogation Session: 20171128123603
MDC IDC PG IMPLANT DT: 20150105

## 2016-12-13 ENCOUNTER — Ambulatory Visit (INDEPENDENT_AMBULATORY_CARE_PROVIDER_SITE_OTHER): Payer: Medicare Other | Admitting: *Deleted

## 2016-12-13 DIAGNOSIS — I639 Cerebral infarction, unspecified: Secondary | ICD-10-CM

## 2016-12-13 NOTE — Progress Notes (Signed)
Carelink Summary Report / Loop Recorder 

## 2016-12-23 LAB — CUP PACEART REMOTE DEVICE CHECK
Date Time Interrogation Session: 20171228130815
MDC IDC PG IMPLANT DT: 20150105

## 2016-12-24 ENCOUNTER — Ambulatory Visit (INDEPENDENT_AMBULATORY_CARE_PROVIDER_SITE_OTHER): Payer: Medicare Other | Admitting: Internal Medicine

## 2016-12-24 ENCOUNTER — Encounter (INDEPENDENT_AMBULATORY_CARE_PROVIDER_SITE_OTHER): Payer: Self-pay

## 2016-12-24 ENCOUNTER — Encounter: Payer: Self-pay | Admitting: Internal Medicine

## 2016-12-24 VITALS — BP 144/90 | HR 76 | Ht 70.0 in | Wt 213.2 lb

## 2016-12-24 DIAGNOSIS — I639 Cerebral infarction, unspecified: Secondary | ICD-10-CM

## 2016-12-24 NOTE — Progress Notes (Signed)
Patient Care Team: Caryl Bis, MD as PCP - General (Unknown Physician Specialty)   HPI  Joshua Horton is a 71 y.o. 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     He has had no palpitations. He denies chest pain or shortness of breath or peripheral edema.  Blood pressures at home are in the 130 range  Cardiac evaluation at that time included a Myoview scan and echocardiogram both of which were normal.  Past Medical History:  Diagnosis Date  . CHF (congestive heart failure) ([place])   . Chronic cough   . COPD (chronic obstructive pulmonary disease) ([place])   . Coronary artery disease   . 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 Hyperlipidemia   . Hypertension   . Myocardial infarction   . Peripheral vascular disease ([place])   . Reflux   . Wheezing     Past Surgical History:  Procedure Laterality Date  . CERVICAL DISC SURGERY     Dr Vertell Limber  . CORONARY ANGIOPLASTY WITH STENT PLACEMENT  2003  . LOOP RECORDER IMPLANT  11/19/2013   MDT LinQ implanted by Dr Caryl Comes for cryptogenic stroke  . LOOP RECORDER IMPLANT N/A 11/19/2013   Procedure: LOOP RECORDER IMPLANT;  Surgeon: Deboraha Sprang, MD;  Location: [facility] CATH LAB;  Service: Cardiovascular;  Laterality: N/A;  . TEE WITHOUT CARDIOVERSION N/A 11/19/2013   Procedure: TRANSESOPHAGEAL ECHOCARDIOGRAM (TEE);  Surgeon: Josue Hector, MD;  Location: [facility] ENDOSCOPY;  Service: Cardiovascular;  Laterality: N/A;    Current Outpatient Prescriptions  Medication Sig Dispense Refill  . amLODipine (NORVASC) 5 MG tablet Take 5 mg by mouth daily.    Marland Kitchen aspirin EC 81 MG EC tablet Take 1 tablet (81 mg total) by mouth daily.    . nitroGLYCERIN (NITROSTAT) 0.4 MG SL tablet Place 0.4 mg  under the tongue every 5 (five) minutes as needed for chest pain.    Marland Kitchen omeprazole (PRILOSEC) 20 MG capsule Take 20 mg by mouth daily.    . simvastatin (ZOCOR) 40 MG tablet Take 40 mg by mouth every evening.     No current facility-administered medications for this visit.     Allergies  Allergen Reactions  . Ivp Dye [Iodinated Diagnostic Agents] Other (See Comments)    Unknown reaction    Review of Systems negative except from HPI and PMH  Physical Exam BP (!) 144/90   Pulse 76   Ht 5\' 10"  (1.778 m)   Wt 213 lb 3.2 oz (96.7 kg)   SpO2 96%   BMI 30.59 kg/m  Well developed and well nourished in no acute distress HENT normal E scleral and icterus clear Neck Supple JVP flat;  Device pocket well healed; without hematoma or erythema.  There is no tethering  Clear to ausculation  Regular rate and rhythm, no murmurs gallops or rub Soft with active bowel sounds No clubbing cyanosis none Edema Alert and oriented, grossly normal motor and sensory function although it is clear that his left hand function is not normal Skin Warm and Dry  ECG demonstrates sinus rhythm 72 20/07/36   Assessment and  Plan  Cryptogenic stroke  Linq recorder in place  Hypertension   Recorder is almost at end-of-life. He would like it explanted.  Blood pressures at  home are in the 120-130 range. We'll follow-up these numbers with Dr. Olena Heckle. Following loop explantation, we will do a wound check and then will see him as needed  No intercurrent atrial fibrillation or flutter  Continue monitoring

## 2016-12-24 NOTE — Patient Instructions (Signed)
Medication Instructions: - Your physician recommends that you continue on your current medications as directed. Please refer to the Current Medication list given to you today.  Labwork: - none ordered  Procedures/Testing: - Your physician has recommended that you have your loop recorder (LINQ) explanted  Please arrive at the [facility], Entrance "A" at [facility] at 7:30 am on Wednesday 12/29/16.   - once you enter, proceed straight down the hall way to Admitting to register, this will be on your left - you may have a light breakfast the morning of your procedure - you may take your regular medications the morning of your procedure - wash your chest and neck area with an antibacterial soap (any brand) the night before and the morning of your procedure  Follow-Up: - Your physician recommends that you schedule a follow-up appointment in: 10-14 days (from 12/29/16) for a wound check appointment with the [facility].    Any Additional Special Instructions Will Be Listed Below (If Applicable).     If you need a refill on your cardiac medications before your next appointment, please call your pharmacy.

## 2016-12-27 LAB — CUP PACEART INCLINIC DEVICE CHECK
Implantable Pulse Generator Implant Date: 20150105
MDC IDC SESS DTM: 20180209131325

## 2016-12-28 NOTE — Addendum Note (Signed)
Addended by: Campbell Riches on: 12/28/2016 07:41 AM   Modules accepted: Orders

## 2016-12-29 ENCOUNTER — Encounter (HOSPITAL_COMMUNITY)
Admission: RE | Disposition: A | Discharge status: Home / Self Care | Payer: Self-pay | Source: Ambulatory Visit | Attending: Internal Medicine

## 2016-12-29 ENCOUNTER — Ambulatory Visit (HOSPITAL_COMMUNITY)
Admission: RE | Admit: 2016-12-29 | Discharge: 2016-12-29 | Disposition: A | Discharge status: Home / Self Care | Payer: Medicare Other | Source: Ambulatory Visit | Attending: Internal Medicine | Admitting: Internal Medicine

## 2016-12-29 DIAGNOSIS — Z7982 Long term (current) use of aspirin: Secondary | ICD-10-CM | POA: Diagnosis not present

## 2016-12-29 DIAGNOSIS — J449 Chronic obstructive pulmonary disease, unspecified: Secondary | ICD-10-CM | POA: Insufficient documentation

## 2016-12-29 DIAGNOSIS — Z4509 Encounter for adjustment and management of other cardiac device: Secondary | ICD-10-CM | POA: Diagnosis not present

## 2016-12-29 DIAGNOSIS — Z79899 Other long term (current) drug therapy: Secondary | ICD-10-CM | POA: Diagnosis not present

## 2016-12-29 DIAGNOSIS — Z955 Presence of coronary angioplasty implant and graft: Secondary | ICD-10-CM | POA: Diagnosis not present

## 2016-12-29 DIAGNOSIS — I252 Old myocardial infarction: Secondary | ICD-10-CM | POA: Insufficient documentation

## 2016-12-29 DIAGNOSIS — I2510 Atherosclerotic heart disease of native coronary artery without angina pectoris: Secondary | ICD-10-CM | POA: Insufficient documentation

## 2016-12-29 DIAGNOSIS — I10 Essential (primary) hypertension: Secondary | ICD-10-CM | POA: Diagnosis not present

## 2016-12-29 DIAGNOSIS — Z8673 Personal history of transient ischemic attack (TIA), and cerebral infarction without residual deficits: Secondary | ICD-10-CM | POA: Diagnosis not present

## 2016-12-29 DIAGNOSIS — K219 Gastro-esophageal reflux disease without esophagitis: Secondary | ICD-10-CM | POA: Insufficient documentation

## 2016-12-29 DIAGNOSIS — I739 Peripheral vascular disease, unspecified: Secondary | ICD-10-CM | POA: Diagnosis not present

## 2016-12-29 DIAGNOSIS — I639 Cerebral infarction, unspecified: Secondary | ICD-10-CM | POA: Diagnosis not present

## 2016-12-29 DIAGNOSIS — E785 Hyperlipidemia, unspecified: Secondary | ICD-10-CM | POA: Diagnosis not present

## 2016-12-29 DIAGNOSIS — I6350 Cerebral infarction due to unspecified occlusion or stenosis of unspecified cerebral artery: Secondary | ICD-10-CM | POA: Diagnosis present

## 2016-12-29 HISTORY — PX: LOOP RECORDER REMOVAL: EP1215

## 2016-12-29 SURGERY — LOOP RECORDER REMOVAL
Anesthesia: LOCAL

## 2016-12-29 MED ORDER — LIDOCAINE HCL (PF) 1 % IJ SOLN
INTRAMUSCULAR | Status: AC
  Filled 2016-12-29: qty 30

## 2016-12-29 SURGICAL SUPPLY — 1 items: PACK LOOP INSERTION (CUSTOM PROCEDURE TRAY) ×2 IMPLANT

## 2016-12-29 NOTE — Discharge Instructions (Signed)
° ° °  CALL FOR ANY SIGNS OR SYMPTOMS OF INFECTION: FEVER, REDNESS, SWELLING, WARMTH AT SITE, OOZING DISCHARGE.

## 2016-12-29 NOTE — Interval H&P Note (Signed)
History and Physical Interval Note:  12/29/2016 8:59 AM  Joshua Horton  has presented today for surgery, with the diagnosis of EOL  The various methods of treatment have been discussed with the patient and family. After consideration of risks, benefits and other options for treatment, the patient has consented to  Procedure(s): Loop Recorder Removal (N/A) as a surgical intervention .  The patient's history has been reviewed, patient examined, no change in status, stable for surgery.  I have reviewed the patient's chart and labs.  Questions were answered to the patient's satisfaction.     Virl Axe

## 2016-12-29 NOTE — H&P (View-Only) (Signed)
Patient Care Team: Caryl Bis, MD as PCP - General (Unknown Physician Specialty)   HPI  Joshua Horton is a 71 y.o. 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     He has had no palpitations. He denies chest pain or shortness of breath or peripheral edema.  Blood pressures at home are in the 130 range  Cardiac evaluation at that time included a Myoview scan and echocardiogram both of which were normal.  Past Medical History:  Diagnosis Date  . CHF (congestive heart failure) ([place])   . Chronic cough   . COPD (chronic obstructive pulmonary disease) ([place])   . Coronary artery disease   . 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 Hyperlipidemia   . Hypertension   . Myocardial infarction   . Peripheral vascular disease ([place])   . Reflux   . Wheezing     Past Surgical History:  Procedure Laterality Date  . CERVICAL DISC SURGERY     Dr Vertell Limber  . CORONARY ANGIOPLASTY WITH STENT PLACEMENT  2003  . LOOP RECORDER IMPLANT  11/19/2013   MDT LinQ implanted by Dr Caryl Comes for cryptogenic stroke  . LOOP RECORDER IMPLANT N/A 11/19/2013   Procedure: LOOP RECORDER IMPLANT;  Surgeon: Deboraha Sprang, MD;  Location: [facility] CATH LAB;  Service: Cardiovascular;  Laterality: N/A;  . TEE WITHOUT CARDIOVERSION N/A 11/19/2013   Procedure: TRANSESOPHAGEAL ECHOCARDIOGRAM (TEE);  Surgeon: Josue Hector, MD;  Location: [facility] ENDOSCOPY;  Service: Cardiovascular;  Laterality: N/A;    Current Outpatient Prescriptions  Medication Sig Dispense Refill  . amLODipine (NORVASC) 5 MG tablet Take 5 mg by mouth daily.    Marland Kitchen aspirin EC 81 MG EC tablet Take 1 tablet (81 mg total) by mouth daily.    . nitroGLYCERIN (NITROSTAT) 0.4 MG SL tablet Place 0.4 mg  under the tongue every 5 (five) minutes as needed for chest pain.    Marland Kitchen omeprazole (PRILOSEC) 20 MG capsule Take 20 mg by mouth daily.    . simvastatin (ZOCOR) 40 MG tablet Take 40 mg by mouth every evening.     No current facility-administered medications for this visit.     Allergies  Allergen Reactions  . Ivp Dye [Iodinated Diagnostic Agents] Other (See Comments)    Unknown reaction    Review of Systems negative except from HPI and PMH  Physical Exam BP (!) 144/90   Pulse 76   Ht 5\' 10"  (1.778 m)   Wt 213 lb 3.2 oz (96.7 kg)   SpO2 96%   BMI 30.59 kg/m  Well developed and well nourished in no acute distress HENT normal E scleral and icterus clear Neck Supple JVP flat;  Device pocket well healed; without hematoma or erythema.  There is no tethering  Clear to ausculation  Regular rate and rhythm, no murmurs gallops or rub Soft with active bowel sounds No clubbing cyanosis none Edema Alert and oriented, grossly normal motor and sensory function although it is clear that his left hand function is not normal Skin Warm and Dry  ECG demonstrates sinus rhythm 72 20/07/36   Assessment and  Plan  Cryptogenic stroke  Linq recorder in place  Hypertension   Recorder is almost at end-of-life. He would like it explanted.  Blood pressures at  home are in the 120-130 range. We'll follow-up these numbers with Dr. Olena Heckle. Following loop explantation, we will do a wound check and then will see him as needed  No intercurrent atrial fibrillation or flutter  Continue monitoring

## 2016-12-30 ENCOUNTER — Encounter (HOSPITAL_COMMUNITY): Payer: Self-pay | Admitting: Internal Medicine

## 2016-12-30 MED FILL — Lidocaine HCl Local Preservative Free (PF) Inj 1%: INTRAMUSCULAR | Qty: 30 | Status: AC

## 2017-01-07 DIAGNOSIS — I10 Essential (primary) hypertension: Secondary | ICD-10-CM | POA: Diagnosis not present

## 2017-01-07 DIAGNOSIS — E782 Mixed hyperlipidemia: Secondary | ICD-10-CM | POA: Diagnosis not present

## 2017-01-07 DIAGNOSIS — K219 Gastro-esophageal reflux disease without esophagitis: Secondary | ICD-10-CM | POA: Diagnosis not present

## 2017-01-10 ENCOUNTER — Ambulatory Visit: Payer: Medicare Other

## 2017-01-11 DIAGNOSIS — I739 Peripheral vascular disease, unspecified: Secondary | ICD-10-CM | POA: Diagnosis not present

## 2017-01-11 DIAGNOSIS — K219 Gastro-esophageal reflux disease without esophagitis: Secondary | ICD-10-CM | POA: Diagnosis not present

## 2017-01-11 DIAGNOSIS — I2510 Atherosclerotic heart disease of native coronary artery without angina pectoris: Secondary | ICD-10-CM | POA: Diagnosis not present

## 2017-01-11 DIAGNOSIS — J449 Chronic obstructive pulmonary disease, unspecified: Secondary | ICD-10-CM | POA: Diagnosis not present

## 2017-01-11 DIAGNOSIS — J301 Allergic rhinitis due to pollen: Secondary | ICD-10-CM | POA: Diagnosis not present

## 2017-01-11 LAB — CUP PACEART REMOTE DEVICE CHECK
Date Time Interrogation Session: 20180127130926
MDC IDC PG IMPLANT DT: 20150105

## 2017-01-12 ENCOUNTER — Ambulatory Visit (INDEPENDENT_AMBULATORY_CARE_PROVIDER_SITE_OTHER): Payer: Medicare Other | Admitting: *Deleted

## 2017-01-12 DIAGNOSIS — I639 Cerebral infarction, unspecified: Secondary | ICD-10-CM

## 2017-01-12 NOTE — Progress Notes (Signed)
Wound check in clinic, s/p ILR explant on 12/29/16.  Wound without redness or edema.  Incision edges well approximated, wound well healed.  ROV with SK PRN.  Return kit ordered to patient's home address for his Carelink monitor.  Patient and wife aware.

## 2017-01-13 ENCOUNTER — Telehealth: Payer: Self-pay | Admitting: Cardiology

## 2017-01-13 NOTE — Telephone Encounter (Signed)
LMOVM requesting that pt send manual transmission b/c home monitor has not updated in at least 14 days.    

## 2017-01-27 ENCOUNTER — Encounter: Payer: Self-pay | Admitting: Family

## 2017-02-09 ENCOUNTER — Ambulatory Visit (INDEPENDENT_AMBULATORY_CARE_PROVIDER_SITE_OTHER): Payer: Medicare Other | Admitting: Family

## 2017-02-09 ENCOUNTER — Ambulatory Visit (HOSPITAL_COMMUNITY)
Admission: RE | Admit: 2017-02-09 | Discharge: 2017-02-09 | Disposition: A | Discharge status: Home / Self Care | Payer: Medicare Other | Source: Ambulatory Visit | Attending: Family | Admitting: Family

## 2017-02-09 ENCOUNTER — Encounter: Payer: Self-pay | Admitting: Family

## 2017-02-09 ENCOUNTER — Ambulatory Visit (INDEPENDENT_AMBULATORY_CARE_PROVIDER_SITE_OTHER)
Admission: RE | Admit: 2017-02-09 | Discharge: 2017-02-09 | Disposition: A | Discharge status: Home / Self Care | Payer: Medicare Other | Source: Ambulatory Visit | Attending: Family | Admitting: Family

## 2017-02-09 VITALS — BP 151/81 | HR 86 | Temp 97.2°F | Resp 16 | Ht 70.0 in | Wt 210.0 lb

## 2017-02-09 DIAGNOSIS — I70209 Unspecified atherosclerosis of native arteries of extremities, unspecified extremity: Secondary | ICD-10-CM

## 2017-02-09 DIAGNOSIS — Z8673 Personal history of transient ischemic attack (TIA), and cerebral infarction without residual deficits: Secondary | ICD-10-CM | POA: Diagnosis not present

## 2017-02-09 DIAGNOSIS — Z87891 Personal history of nicotine dependence: Secondary | ICD-10-CM | POA: Diagnosis not present

## 2017-02-09 DIAGNOSIS — I6523 Occlusion and stenosis of bilateral carotid arteries: Secondary | ICD-10-CM | POA: Insufficient documentation

## 2017-02-09 DIAGNOSIS — I739 Peripheral vascular disease, unspecified: Secondary | ICD-10-CM

## 2017-02-09 DIAGNOSIS — I745 Embolism and thrombosis of iliac artery: Secondary | ICD-10-CM

## 2017-02-09 DIAGNOSIS — I6521 Occlusion and stenosis of right carotid artery: Secondary | ICD-10-CM

## 2017-02-09 LAB — VAS US CAROTID
LCCADDIAS: -15 cm/s
LEFT ECA DIAS: -11 cm/s
LEFT VERTEBRAL DIAS: -9 cm/s
LICADSYS: -95 cm/s
LICAPSYS: -102 cm/s
Left CCA dist sys: -102 cm/s
Left CCA prox dias: 16 cm/s
Left CCA prox sys: 157 cm/s
Left ICA dist dias: -21 cm/s
Left ICA prox dias: -18 cm/s
RCCAPDIAS: 17 cm/s
RIGHT CCA MID DIAS: 18 cm/s
RIGHT ECA DIAS: -12 cm/s
Right CCA prox sys: 138 cm/s

## 2017-02-09 NOTE — Patient Instructions (Signed)
Stroke Prevention Some medical conditions and behaviors are associated with an increased chance of having a stroke. You may prevent a stroke by making healthy choices and managing medical conditions. How can I reduce my risk of having a stroke?  Stay physically active. Get at least 30 minutes of activity on most or all days.  Do not smoke. It may also be helpful to avoid exposure to secondhand smoke.  Limit alcohol use. Moderate alcohol use is considered to be:  No more than 2 drinks per day for men.  No more than 1 drink per day for nonpregnant women.  Eat healthy foods. This involves:  Eating 5 or more servings of fruits and vegetables a day.  Making dietary changes that address high blood pressure (hypertension), high cholesterol, diabetes, or obesity.  Manage your cholesterol levels.  Making food choices that are high in fiber and low in saturated fat, trans fat, and cholesterol may control cholesterol levels.  Take any prescribed medicines to control cholesterol as directed by your health care provider.  Manage your diabetes.  Controlling your carbohydrate and sugar intake is recommended to manage diabetes.  Take any prescribed medicines to control diabetes as directed by your health care provider.  Control your hypertension.  Making food choices that are low in salt (sodium), saturated fat, trans fat, and cholesterol is recommended to manage hypertension.  Ask your health care provider if you need treatment to lower your blood pressure. Take any prescribed medicines to control hypertension as directed by your health care provider.  If you are 18-39 years of age, have your blood pressure checked every 3-5 years. If you are 40 years of age or older, have your blood pressure checked every year.  Maintain a healthy weight.  Reducing calorie intake and making food choices that are low in sodium, saturated fat, trans fat, and cholesterol are recommended to manage  weight.  Stop drug abuse.  Avoid taking birth control pills.  Talk to your health care provider about the risks of taking birth control pills if you are over 35 years old, smoke, get migraines, or have ever had a blood clot.  Get evaluated for sleep disorders (sleep apnea).  Talk to your health care provider about getting a sleep evaluation if you snore a lot or have excessive sleepiness.  Take medicines only as directed by your health care provider.  For some people, aspirin or blood thinners (anticoagulants) are helpful in reducing the risk of forming abnormal blood clots that can lead to stroke. If you have the irregular heart rhythm of atrial fibrillation, you should be on a blood thinner unless there is a good reason you cannot take them.  Understand all your medicine instructions.  Make sure that other conditions (such as anemia or atherosclerosis) are addressed. Get help right away if:  You have sudden weakness or numbness of the face, arm, or leg, especially on one side of the body.  Your face or eyelid droops to one side.  You have sudden confusion.  You have trouble speaking (aphasia) or understanding.  You have sudden trouble seeing in one or both eyes.  You have sudden trouble walking.  You have dizziness.  You have a loss of balance or coordination.  You have a sudden, severe headache with no known cause.  You have new chest pain or an irregular heartbeat. Any of these symptoms may represent a serious problem that is an emergency. Do not wait to see if the symptoms will go away.   Get medical help at once. Call your local emergency services (911 in [place] Do not drive yourself to the hospital. This information is not intended to replace advice given to you by your health care provider. Make sure you discuss any questions you have with your health care provider. Document Released: 12/09/2004 Document Revised: 04/08/2016 Document Reviewed: 05/04/2013 Elsevier  Interactive Patient Education  2017 Elsevier Inc.     Preventing Cerebrovascular Disease Arteries are blood vessels that carry blood that contains oxygen from the heart to all parts of the body. Cerebrovascular disease affects arteries that supply the brain. Any condition that blocks or disrupts blood flow to the brain can cause cerebrovascular disease. Brain cells that lose blood supply start to die within minutes (stroke). Stroke is the main danger of cerebrovascular disease. Atherosclerosis and high blood pressure are common causes of cerebrovascular disease. Atherosclerosis is narrowing and hardening of an artery that results when fat, cholesterol, calcium, or other substances (plaque) build up inside an artery. Plaque reduces blood flow through the artery. High blood pressure increases the risk of bleeding inside the brain. Making diet and lifestyle changes to prevent atherosclerosis and high blood pressure lowers your risk of cerebrovascular disease. What nutrition changes can be made?  Eat more fruits, vegetables, and whole grains.  Reduce how much saturated fat you eat. To do this, eat less red meat and fewer full-fat dairy products.  Eat healthy proteins instead of red meat. Healthy proteins include:  Fish. Eat fish that contains heart-healthy omega-3 fatty acids, twice a week. Examples include salmon, albacore tuna, mackerel, and herring.  Chicken.  Nuts.  Low-fat or nonfat yogurt.  Avoid processed meats, like bacon and lunchmeat.  Avoid foods that contain:  A lot of sugar, such as sweets and drinks with added sugar.  A lot of salt (sodium). Avoid adding extra salt to your food, as told by your health care provider.  Trans fats, such as margarine and baked goods. Trans fats may be listed as "partially hydrogenated oils" on food labels.  Check food labels to see how much sodium, sugar, and trans fats are in foods.  Use vegetable oils that contain low amounts of  saturated fat, such as olive oil or canola oil. What lifestyle changes can be made?  Drink alcohol in moderation. This means no more than 1 drink a day for nonpregnant women and 2 drinks a day for men. One drink equals 12 oz of beer, 5 oz of wine, or 1 oz of hard liquor.  If you are overweight, ask your health care provider to recommend a weight-loss plan for you. Losing 5-10 lb (2.2-4.5 kg) can reduce your risk of diabetes, atherosclerosis, and high blood pressure.  Exercise for 30?60 minutes on most days, or as much as told by your health care provider.  Do moderate-intensity exercise, such as brisk walking, bicycling, and water aerobics. Ask your health care provider which activities are safe for you.  Do not use any products that contain nicotine or tobacco, such as cigarettes and e-cigarettes. If you need help quitting, ask your health care provider. Why are these changes important? Making these changes lowers your risk of many diseases that can cause cerebrovascular disease and stroke. Stroke is a leading cause of death and disability. Making these changes also improves your overall health and quality of life. What can I do to lower my risk? The following factors make you more likely to develop cerebrovascular disease:  Being overweight.  Smoking.  Being physically inactive.    Eating a high-fat diet.  Having certain health conditions, such as:  Diabetes.  High blood pressure.  Heart disease.  Atherosclerosis.  High cholesterol.  Sickle cell disease. Talk with your health care provider about your risk for cerebrovascular disease. Work with your health care provider to control diseases that you have that may contribute to cerebrovascular disease. Your health care provider may prescribe medicines to help prevent major causes of cerebrovascular disease. Where to find more information: Learn more about preventing cerebrovascular disease from:  [place], Lung, and  [place]: MoAnalyst.de  Centers for Disease Control and Prevention: http://www.curry-wood.biz/ Summary  Cerebrovascular disease can lead to a stroke.  Atherosclerosis and high blood pressure are major causes of cerebrovascular disease.  Making diet and lifestyle changes can reduce your risk of cerebrovascular disease.  Work with your health care provider to get your risk factors under control to reduce your risk of cerebrovascular disease. This information is not intended to replace advice given to you by your health care provider. Make sure you discuss any questions you have with your health care provider. Document Released: 11/16/2015 Document Revised: 05/21/2016 Document Reviewed: 11/16/2015 Elsevier Interactive Patient Education  2017 [place].     Peripheral Vascular Disease Peripheral vascular disease (PVD) is a disease of the blood vessels that are not part of your heart and brain. A simple term for PVD is poor circulation. In most cases, PVD narrows the blood vessels that carry blood from your heart to the rest of your body. This can result in a decreased supply of blood to your arms, legs, and internal organs, like your stomach or kidneys. However, it most often affects a person's lower legs and feet. There are two types of PVD.  Organic PVD. This is the more common type. It is caused by damage to the structure of blood vessels.  Functional PVD. This is caused by conditions that make blood vessels contract and tighten (spasm). Without treatment, PVD tends to get worse over time. PVD can also lead to acute ischemic limb. This is when an arm or limb suddenly has trouble getting enough blood. This is a medical emergency. Follow these instructions at home:  Take medicines only as told by your doctor.  Do not use any tobacco products, including cigarettes, chewing tobacco, or electronic cigarettes. If you need help quitting, ask  your doctor.  Lose weight if you are overweight, and maintain a healthy weight as told by your doctor.  Eat a diet that is low in fat and cholesterol. If you need help, ask your doctor.  Exercise regularly. Ask your doctor for some good activities for you.  Take good care of your feet.  Wear comfortable shoes that fit well.  Check your feet often for any cuts or sores. Contact a doctor if:  You have cramps in your legs while walking.  You have leg pain when you are at rest.  You have coldness in a leg or foot.  Your skin changes.  You are unable to get or have an erection (erectile dysfunction).  You have cuts or sores on your feet that are not healing. Get help right away if:  Your arm or leg turns cold and blue.  Your arms or legs become red, warm, swollen, painful, or numb.  You have chest pain or trouble breathing.  You suddenly have weakness in your face, arm, or leg.  You become very confused or you cannot speak.  You suddenly have a very bad headache.  You suddenly cannot see. This information is not intended to replace advice given to you by your health care provider. Make sure you discuss any questions you have with your health care provider. Document Released: 01/26/2010 Document Revised: 04/08/2016 Document Reviewed: 04/11/2014 Elsevier Interactive Patient Education  2017 Elsevier Inc.  

## 2017-02-09 NOTE — Progress Notes (Signed)
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 male patient of Dr. 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 In addition when Dr. Scot Dock saw him last he had a mild 40-59% right carotid stenosis. He comes in for a 1 year follow up visit. He has had a right brain stroke in 2014 associated with weakness in the left hand and some paresthesias in the left face, and slurred speach. He denies any subsequent neurological event. He is on aspirin. He is on a statin.  He had stroke on 11-12-13, and again on 11-15-13 as manifested by slurred speech and left hemiparesis; has has residual weakness and numbness in his left hand, speech cleared. He and his wife  With respect to his claudication he states that he does not have any significant calf thigh or hip claudication on either side. He has the known left common iliac artery occlusion. He quit smoking in 2014 and tries to walk as much as possible. He denies any history of rest pain. He denies any history of nonhealing ulcers.  Pt states that his dyspnea limits his walking.  Wife states his blood pressure at home is 130-140's/ 50's  Pt Diabetic: No Pt smoker: former smoker, quit in 2014  Pt meds include: Statin :Yes ASA: Yes Other anticoagulants/antiplatelets: no      Past Medical History:  Diagnosis Date  . CHF (congestive heart failure) ([place])   . Chronic cough   . COPD (chronic obstructive pulmonary disease) ([place])   . Coronary artery disease   . 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 Hyperlipidemia   . Hypertension   .  Myocardial infarction   . Peripheral vascular disease ([place])   . Reflux   . Wheezing     Social History Social History  Substance Use Topics  . Smoking status: Former Smoker    Packs/day: 1.00    Years: 51.00    Types: Cigarettes    Quit date: 04/03/2013  . Smokeless tobacco: Never Used  . Alcohol use No    Family History Family History  Problem Relation Age of Onset  . Cancer Mother   . Deep vein thrombosis Father   . Heart disease Father   . Hyperlipidemia Father   . Hypertension Father   . Heart attack Father   . Peripheral vascular disease Father   . Cancer Father   . Heart disease    . Arthritis    . Cancer    . Kidney disease    . Heart disease Brother   . Hyperlipidemia Brother   . Heart attack Brother     Past Surgical History:  Procedure Laterality Date  . CERVICAL DISC SURGERY     Dr Vertell Limber  . CORONARY ANGIOPLASTY WITH STENT PLACEMENT  2003  . LOOP RECORDER IMPLANT  11/19/2013   MDT LinQ implanted by Dr Caryl Comes for cryptogenic stroke  . LOOP RECORDER IMPLANT N/A 11/19/2013   Procedure: LOOP RECORDER IMPLANT;  Surgeon: Deboraha Sprang, MD;  Location: [facility] CATH LAB;  Service: Cardiovascular;  Laterality: N/A;  . LOOP RECORDER REMOVAL N/A 12/29/2016   Procedure: Loop Recorder Removal;  Surgeon: Remo Lipps  Peterson Lombard, MD;  Location: [place] CV LAB;  Service: Cardiovascular;  Laterality: N/A;  . TEE WITHOUT CARDIOVERSION N/A 11/19/2013   Procedure: TRANSESOPHAGEAL ECHOCARDIOGRAM (TEE);  Surgeon: Josue Hector, MD;  Location: [facility] ENDOSCOPY;  Service: Cardiovascular;  Laterality: N/A;    Allergies  Allergen Reactions  . Ivp Dye [Iodinated Diagnostic Agents] Shortness Of Breath    Current Outpatient Prescriptions  Medication Sig Dispense Refill  . amLODipine (NORVASC) 5 MG tablet Take 5 mg by mouth daily.    Marland Kitchen aspirin EC 81 MG EC tablet Take 1 tablet (81 mg total) by mouth daily.    . diclofenac sodium (VOLTAREN) 1 % GEL Apply 1 application topically daily as needed for  pain.    . fluticasone (CUTIVATE) 0.05 % cream Apply 1 application topically daily as needed (fungus). Apply to face and neck    . ibuprofen (ADVIL,MOTRIN) 200 MG tablet Take 400 mg by mouth daily as needed for headache or moderate pain.    Marland Kitchen omeprazole (PRILOSEC) 20 MG capsule Take 20 mg by mouth daily.    . simvastatin (ZOCOR) 40 MG tablet Take 40 mg by mouth every evening.     No current facility-administered medications for this visit.     ROS: See HPI for pertinent positives and negatives.   Physical Examination  Vitals:   02/09/17 1039 02/09/17 1050  BP: (!) 144/83 (!) 151/81  Pulse: 86   Resp: 16   Temp: 97.2 F (36.2 C)   TempSrc: Oral   SpO2: 93%   Weight: 210 lb (95.3 kg)   Height: 5\' 10"  (1.778 m)    Body mass index is 30.13 kg/m.  General:WDWN in NAD, obese male Gait: Normal HENT: WNL Eyes: Pupils equal, left lower eyelid ectropion, bilateral ptosis, left worse than right Pulmonary: normal non-labored breathing, no rales, rhonchi, or wheezing Cardiac: RRR, no murmur detected  Abdomen: soft, NT, asymptomatic reducible umbilical and ventral hernias. Large panus.  Skin: no rashes, no ulcers;  no cellulitis.  VASCULAR EXAM  Carotid Bruits Right Left   Negative Negative    Aorta is not palpable Radial pulses are 2+ palpable                         VASCULAR EXAM: Extremities without ischemic changes, without Gangrene; without open wounds. Feet and toes are pink and warm with brisk capillary refill.                                                                                                                                                        LE Pulses Right Left       FEMORAL  not palpable (obese)  not palpable        POPLITEAL  not palpable   not palpable  POSTERIOR TIBIAL  not palpable   not palpable        DORSALIS PEDIS      ANTERIOR TIBIAL 1+ palpable  not palpable      Musculoskeletal: no muscle wasting or atrophy;  no peripheral edema          Neurologic: A&O X 3; Appropriate Affect ;  SENSATION: normal; MOTOR FUNCTION: 5/5 Symmetric except for 4/5 left hand grip, CN 2-12 intact except speech is slightly slurred Speech is fluent/normal      ASSESSMENT: Joshua Horton is a 71 y.o. male  who has a known left common iliac artery occlusion based on arteriogram from 2008.  He had a right brain stroke in 2014 associated with weakness in the left hand and some paresthesias in the left face, and slurred speech, no subsequent neurological event.  He is on aspirin. He is on a statin.  With respect to his claudication he states that he does not have any significant calf thigh or hip claudication on either side, states that his dyspnea limits his walking. He quit smoking in 2014 and tries to walk as much as possible.   DATA(02/09/17):  Carotid duplex remains stable: Right ICA: 40-59% stenosis, Left ICA: <40% stenosis. Right vertebral artery flow is antegrade/restrictive, left vertebral artery flow is antegrade. Bilateral subclavian artery waveforms are normal. No significant change since exam on 02-04-16.    ABI's  Right: 0.81 (0.76, 02-03-16), waveforms: biphasic; TBI: 0.47 Left: 0.72 (0.63, 02-03-16), waveforms: PT: monophasic, DP: biphasic; TBI: 0.44 Slight improvement in bilateral ABI.    PLAN:   Graduated walking program discussed and how to achieve.   Based on today's exam and non-invasive vascular lab results, the patient will follow up in 1 year with the following tests: carotid duplex and ABI's. I advised him to notify us if he develops concerns re the circulation in his feet/legs.  I discussed in depth with the patient the nature of atherosclerosis, and emphasized the importance of maximal medical management including strict control of blood pressure, blood glucose, and lipid levels, obtaining regular exercise, and continued cessation of smoking.  The patient is aware that without maximal  medical management the underlying atherosclerotic disease process will progress, limiting the benefit of any interventions.  The patient was given information about PAD including signs, symptoms, treatment, what symptoms should prompt the patient to seek immediate medical care, and risk reduction measures to take.  Clemon Chambers, RN, MSN, FNP-C [facility] [employer] Phone: [phone]  Clinic MD: Scot Dock  02/09/17 11:08 AM

## 2017-02-10 NOTE — Addendum Note (Signed)
Addended by: Lianne Cure A on: 02/10/2017 09:39 AM   Modules accepted: Orders

## 2017-05-10 DIAGNOSIS — E782 Mixed hyperlipidemia: Secondary | ICD-10-CM | POA: Diagnosis not present

## 2017-05-10 DIAGNOSIS — R7301 Impaired fasting glucose: Secondary | ICD-10-CM | POA: Diagnosis not present

## 2017-05-10 DIAGNOSIS — I10 Essential (primary) hypertension: Secondary | ICD-10-CM | POA: Diagnosis not present

## 2017-05-10 DIAGNOSIS — J449 Chronic obstructive pulmonary disease, unspecified: Secondary | ICD-10-CM | POA: Diagnosis not present

## 2017-05-10 DIAGNOSIS — K219 Gastro-esophageal reflux disease without esophagitis: Secondary | ICD-10-CM | POA: Diagnosis not present

## 2017-05-13 DIAGNOSIS — K219 Gastro-esophageal reflux disease without esophagitis: Secondary | ICD-10-CM | POA: Diagnosis not present

## 2017-05-13 DIAGNOSIS — I2510 Atherosclerotic heart disease of native coronary artery without angina pectoris: Secondary | ICD-10-CM | POA: Diagnosis not present

## 2017-05-13 DIAGNOSIS — I739 Peripheral vascular disease, unspecified: Secondary | ICD-10-CM | POA: Diagnosis not present

## 2017-05-13 DIAGNOSIS — J449 Chronic obstructive pulmonary disease, unspecified: Secondary | ICD-10-CM | POA: Diagnosis not present

## 2017-05-13 DIAGNOSIS — K429 Umbilical hernia without obstruction or gangrene: Secondary | ICD-10-CM | POA: Diagnosis not present

## 2017-06-09 DIAGNOSIS — H02105 Unspecified ectropion of left lower eyelid: Secondary | ICD-10-CM | POA: Diagnosis not present

## 2017-06-16 DIAGNOSIS — H02839 Dermatochalasis of unspecified eye, unspecified eyelid: Secondary | ICD-10-CM | POA: Diagnosis not present

## 2017-06-16 DIAGNOSIS — H02115 Cicatricial ectropion of left lower eyelid: Secondary | ICD-10-CM | POA: Diagnosis not present

## 2017-07-08 DIAGNOSIS — H02115 Cicatricial ectropion of left lower eyelid: Secondary | ICD-10-CM | POA: Diagnosis not present

## 2017-07-08 DIAGNOSIS — H02113 Cicatricial ectropion of right eye, unspecified eyelid: Secondary | ICD-10-CM | POA: Diagnosis not present

## 2017-07-08 DIAGNOSIS — Z9889 Other specified postprocedural states: Secondary | ICD-10-CM | POA: Diagnosis not present

## 2017-07-08 DIAGNOSIS — H189 Unspecified disorder of cornea: Secondary | ICD-10-CM | POA: Diagnosis not present

## 2017-07-08 DIAGNOSIS — Z8673 Personal history of transient ischemic attack (TIA), and cerebral infarction without residual deficits: Secondary | ICD-10-CM | POA: Diagnosis not present

## 2017-07-08 DIAGNOSIS — Z7982 Long term (current) use of aspirin: Secondary | ICD-10-CM | POA: Diagnosis not present

## 2017-07-08 DIAGNOSIS — H16212 Exposure keratoconjunctivitis, left eye: Secondary | ICD-10-CM | POA: Diagnosis not present

## 2017-07-08 DIAGNOSIS — H02112 Cicatricial ectropion of right lower eyelid: Secondary | ICD-10-CM | POA: Diagnosis not present

## 2017-07-08 DIAGNOSIS — Z87891 Personal history of nicotine dependence: Secondary | ICD-10-CM | POA: Diagnosis not present

## 2017-07-08 DIAGNOSIS — H0289 Other specified disorders of eyelid: Secondary | ICD-10-CM | POA: Diagnosis not present

## 2017-07-19 DIAGNOSIS — I10 Essential (primary) hypertension: Secondary | ICD-10-CM | POA: Diagnosis not present

## 2017-07-19 DIAGNOSIS — R42 Dizziness and giddiness: Secondary | ICD-10-CM | POA: Diagnosis not present

## 2017-08-17 DIAGNOSIS — Z23 Encounter for immunization: Secondary | ICD-10-CM | POA: Diagnosis not present

## 2017-09-12 DIAGNOSIS — I10 Essential (primary) hypertension: Secondary | ICD-10-CM | POA: Diagnosis not present

## 2017-09-12 DIAGNOSIS — K219 Gastro-esophageal reflux disease without esophagitis: Secondary | ICD-10-CM | POA: Diagnosis not present

## 2017-09-12 DIAGNOSIS — E782 Mixed hyperlipidemia: Secondary | ICD-10-CM | POA: Diagnosis not present

## 2017-09-12 DIAGNOSIS — J449 Chronic obstructive pulmonary disease, unspecified: Secondary | ICD-10-CM | POA: Diagnosis not present

## 2017-09-16 DIAGNOSIS — Z1212 Encounter for screening for malignant neoplasm of rectum: Secondary | ICD-10-CM | POA: Diagnosis not present

## 2017-09-16 DIAGNOSIS — J449 Chronic obstructive pulmonary disease, unspecified: Secondary | ICD-10-CM | POA: Diagnosis not present

## 2017-09-16 DIAGNOSIS — I2510 Atherosclerotic heart disease of native coronary artery without angina pectoris: Secondary | ICD-10-CM | POA: Diagnosis not present

## 2017-09-16 DIAGNOSIS — I739 Peripheral vascular disease, unspecified: Secondary | ICD-10-CM | POA: Diagnosis not present

## 2017-09-16 DIAGNOSIS — I10 Essential (primary) hypertension: Secondary | ICD-10-CM | POA: Diagnosis not present

## 2017-09-16 DIAGNOSIS — Z0001 Encounter for general adult medical examination with abnormal findings: Secondary | ICD-10-CM | POA: Diagnosis not present

## 2017-10-20 DIAGNOSIS — H02115 Cicatricial ectropion of left lower eyelid: Secondary | ICD-10-CM | POA: Diagnosis not present

## 2017-12-09 DIAGNOSIS — H02105 Unspecified ectropion of left lower eyelid: Secondary | ICD-10-CM | POA: Diagnosis not present

## 2017-12-09 DIAGNOSIS — L988 Other specified disorders of the skin and subcutaneous tissue: Secondary | ICD-10-CM | POA: Diagnosis not present

## 2017-12-09 DIAGNOSIS — I10 Essential (primary) hypertension: Secondary | ICD-10-CM | POA: Diagnosis not present

## 2017-12-09 DIAGNOSIS — H04202 Unspecified epiphora, left lacrimal gland: Secondary | ICD-10-CM | POA: Diagnosis not present

## 2017-12-09 DIAGNOSIS — C44320 Squamous cell carcinoma of skin of unspecified parts of face: Secondary | ICD-10-CM | POA: Diagnosis not present

## 2017-12-09 DIAGNOSIS — H02103 Unspecified ectropion of right eye, unspecified eyelid: Secondary | ICD-10-CM | POA: Diagnosis not present

## 2017-12-09 DIAGNOSIS — H02106 Unspecified ectropion of left eye, unspecified eyelid: Secondary | ICD-10-CM | POA: Diagnosis not present

## 2017-12-09 DIAGNOSIS — Z87891 Personal history of nicotine dependence: Secondary | ICD-10-CM | POA: Diagnosis not present

## 2017-12-09 DIAGNOSIS — H02115 Cicatricial ectropion of left lower eyelid: Secondary | ICD-10-CM | POA: Diagnosis not present

## 2017-12-09 DIAGNOSIS — D0439 Carcinoma in situ of skin of other parts of face: Secondary | ICD-10-CM | POA: Diagnosis not present

## 2017-12-09 DIAGNOSIS — C44329 Squamous cell carcinoma of skin of other parts of face: Secondary | ICD-10-CM | POA: Diagnosis not present

## 2017-12-09 DIAGNOSIS — J449 Chronic obstructive pulmonary disease, unspecified: Secondary | ICD-10-CM | POA: Diagnosis not present

## 2017-12-09 DIAGNOSIS — K219 Gastro-esophageal reflux disease without esophagitis: Secondary | ICD-10-CM | POA: Diagnosis not present

## 2017-12-09 DIAGNOSIS — I2510 Atherosclerotic heart disease of native coronary artery without angina pectoris: Secondary | ICD-10-CM | POA: Diagnosis not present

## 2017-12-09 DIAGNOSIS — H02135 Senile ectropion of left lower eyelid: Secondary | ICD-10-CM | POA: Diagnosis not present

## 2017-12-20 DIAGNOSIS — H02115 Cicatricial ectropion of left lower eyelid: Secondary | ICD-10-CM | POA: Diagnosis not present

## 2017-12-20 DIAGNOSIS — D0430 Carcinoma in situ of skin of unspecified part of face: Secondary | ICD-10-CM | POA: Diagnosis not present

## 2018-01-19 DIAGNOSIS — R7301 Impaired fasting glucose: Secondary | ICD-10-CM | POA: Diagnosis not present

## 2018-01-19 DIAGNOSIS — I739 Peripheral vascular disease, unspecified: Secondary | ICD-10-CM | POA: Diagnosis not present

## 2018-01-19 DIAGNOSIS — I2510 Atherosclerotic heart disease of native coronary artery without angina pectoris: Secondary | ICD-10-CM | POA: Diagnosis not present

## 2018-01-19 DIAGNOSIS — Z0001 Encounter for general adult medical examination with abnormal findings: Secondary | ICD-10-CM | POA: Diagnosis not present

## 2018-01-19 DIAGNOSIS — E782 Mixed hyperlipidemia: Secondary | ICD-10-CM | POA: Diagnosis not present

## 2018-02-02 DIAGNOSIS — D0439 Carcinoma in situ of skin of other parts of face: Secondary | ICD-10-CM | POA: Diagnosis not present

## 2018-02-03 DIAGNOSIS — C441292 Squamous cell carcinoma of skin of left lower eyelid, including canthus: Secondary | ICD-10-CM | POA: Diagnosis not present

## 2018-02-03 DIAGNOSIS — I10 Essential (primary) hypertension: Secondary | ICD-10-CM | POA: Diagnosis not present

## 2018-02-03 DIAGNOSIS — Z87891 Personal history of nicotine dependence: Secondary | ICD-10-CM | POA: Diagnosis not present

## 2018-02-03 DIAGNOSIS — Z8673 Personal history of transient ischemic attack (TIA), and cerebral infarction without residual deficits: Secondary | ICD-10-CM | POA: Diagnosis not present

## 2018-02-03 DIAGNOSIS — Z481 Encounter for planned postprocedural wound closure: Secondary | ICD-10-CM | POA: Diagnosis not present

## 2018-02-15 ENCOUNTER — Ambulatory Visit: Payer: Medicare Other | Admitting: Family

## 2018-02-15 ENCOUNTER — Encounter (HOSPITAL_COMMUNITY): Payer: Medicare Other

## 2018-02-17 ENCOUNTER — Encounter (HOSPITAL_COMMUNITY): Payer: Medicare Other

## 2018-02-17 ENCOUNTER — Ambulatory Visit: Payer: Medicare Other | Admitting: Family

## 2018-02-28 ENCOUNTER — Encounter: Payer: Self-pay | Admitting: Family

## 2018-02-28 ENCOUNTER — Ambulatory Visit (HOSPITAL_COMMUNITY)
Admission: RE | Admit: 2018-02-28 | Discharge: 2018-02-28 | Disposition: A | Discharge status: Home / Self Care | Payer: Medicare Other | Source: Ambulatory Visit | Attending: Family | Admitting: Family

## 2018-02-28 ENCOUNTER — Other Ambulatory Visit: Payer: Self-pay

## 2018-02-28 ENCOUNTER — Ambulatory Visit (INDEPENDENT_AMBULATORY_CARE_PROVIDER_SITE_OTHER)
Admission: RE | Admit: 2018-02-28 | Discharge: 2018-02-28 | Disposition: A | Discharge status: Home / Self Care | Payer: Medicare Other | Source: Ambulatory Visit | Attending: Family | Admitting: Family

## 2018-02-28 ENCOUNTER — Ambulatory Visit: Payer: Medicare Other | Admitting: Family

## 2018-02-28 VITALS — BP 156/83 | HR 78 | Temp 98.1°F | Resp 20 | Ht 70.0 in | Wt 214.0 lb

## 2018-02-28 DIAGNOSIS — I745 Embolism and thrombosis of iliac artery: Secondary | ICD-10-CM | POA: Diagnosis not present

## 2018-02-28 DIAGNOSIS — I6521 Occlusion and stenosis of right carotid artery: Secondary | ICD-10-CM

## 2018-02-28 DIAGNOSIS — Z87891 Personal history of nicotine dependence: Secondary | ICD-10-CM

## 2018-02-28 DIAGNOSIS — I70209 Unspecified atherosclerosis of native arteries of extremities, unspecified extremity: Secondary | ICD-10-CM | POA: Diagnosis not present

## 2018-02-28 DIAGNOSIS — I779 Disorder of arteries and arterioles, unspecified: Secondary | ICD-10-CM | POA: Diagnosis not present

## 2018-02-28 NOTE — Patient Instructions (Signed)
Stroke Prevention Some health problems and behaviors may make it more likely for you to have a stroke. Below are ways to lessen your risk of having a stroke.  Be active for at least 30 minutes on most or all days.  Do not smoke. Try not to be around others who smoke.  Do not drink too much alcohol. ? Do not have more than 2 drinks a day if you are a man. ? Do not have more than 1 drink a day if you are a woman and are not pregnant.  Eat healthy foods, such as fruits and vegetables. If you were put on a specific diet, follow the diet as told.  Keep your cholesterol levels under control through diet and medicines. Look for foods that are low in saturated fat, trans fat, cholesterol, and are high in fiber.  If you have diabetes, follow all diet plans and take your medicine as told.  Ask your doctor if you need treatment to lower your blood pressure. If you have high blood pressure (hypertension), follow all diet plans and take your medicine as told by your doctor.  If you are 18-39 years old, have your blood pressure checked every 3-5 years. If you are age 40 or older, have your blood pressure checked every year.  Keep a healthy weight. Eat foods that are low in calories, salt, saturated fat, trans fat, and cholesterol.  Do not take drugs.  Avoid birth control pills, if this applies. Talk to your doctor about the risks of taking birth control pills.  Talk to your doctor if you have sleep problems (sleep apnea).  Take all medicine as told by your doctor. ? You may be told to take aspirin or blood thinner medicine. Take this medicine as told by your doctor. ? Understand your medicine instructions.  Make sure any other conditions you have are being taken care of.  Get help right away if:  You suddenly lose feeling (you feel numb) or have weakness in your face, arm, or leg.  Your face or eyelid hangs down to one side.  You suddenly feel confused.  You have trouble talking  (aphasia) or understanding what people are saying.  You suddenly have trouble seeing in one or both eyes.  You suddenly have trouble walking.  You are dizzy.  You lose your balance or your movements are clumsy (uncoordinated).  You suddenly have a very bad headache and you do not know the cause.  You have new chest pain.  Your heart feels like it is fluttering or skipping a beat (irregular heartbeat). Do not wait to see if the symptoms above go away. Get help right away. Call your local emergency services (911 in [place] Do not drive yourself to the hospital. This information is not intended to replace advice given to you by your health care provider. Make sure you discuss any questions you have with your health care provider. Document Released: 05/02/2012 Document Revised: 04/08/2016 Document Reviewed: 05/04/2013 Elsevier Interactive Patient Education  2018 Elsevier Inc.     Peripheral Vascular Disease Peripheral vascular disease (PVD) is a disease of the blood vessels that are not part of your heart and brain. A simple term for PVD is poor circulation. In most cases, PVD narrows the blood vessels that carry blood from your heart to the rest of your body. This can result in a decreased supply of blood to your arms, legs, and internal organs, like your stomach or kidneys. However, it most often affects   a person's lower legs and feet. There are two types of PVD.  Organic PVD. This is the more common type. It is caused by damage to the structure of blood vessels.  Functional PVD. This is caused by conditions that make blood vessels contract and tighten (spasm).  Without treatment, PVD tends to get worse over time. PVD can also lead to acute ischemic limb. This is when an arm or limb suddenly has trouble getting enough blood. This is a medical emergency. Follow these instructions at home:  Take medicines only as told by your doctor.  Do not use any tobacco products, including  cigarettes, chewing tobacco, or electronic cigarettes. If you need help quitting, ask your doctor.  Lose weight if you are overweight, and maintain a healthy weight as told by your doctor.  Eat a diet that is low in fat and cholesterol. If you need help, ask your doctor.  Exercise regularly. Ask your doctor for some good activities for you.  Take good care of your feet. ? Wear comfortable shoes that fit well. ? Check your feet often for any cuts or sores. Contact a doctor if:  You have cramps in your legs while walking.  You have leg pain when you are at rest.  You have coldness in a leg or foot.  Your skin changes.  You are unable to get or have an erection (erectile dysfunction).  You have cuts or sores on your feet that are not healing. Get help right away if:  Your arm or leg turns cold and blue.  Your arms or legs become red, warm, swollen, painful, or numb.  You have chest pain or trouble breathing.  You suddenly have weakness in your face, arm, or leg.  You become very confused or you cannot speak.  You suddenly have a very bad headache.  You suddenly cannot see. This information is not intended to replace advice given to you by your health care provider. Make sure you discuss any questions you have with your health care provider. Document Released: 01/26/2010 Document Revised: 04/08/2016 Document Reviewed: 04/11/2014 Elsevier Interactive Patient Education  2017 Elsevier Inc.  

## 2018-02-28 NOTE — Progress Notes (Signed)
[facility] [place] HISTORY AND PHYSICAL   CC: Follow up extracranial carotid artery stenosis and peripheral artery occlusive disease    History of Present Illness:   Joshua Horton is a 72 y.o. male whom Dr. Scot Dock has been monitoring for a known left common iliac artery occlusion based on arteriogram from 2008. In addition when Dr. Scot Dock saw him last he had a mild 40-59% right carotid stenosis. He comes in for a 1 year follow up visit. He has had a right brain stroke in 2014 associated with weakness in the left hand and some paresthesias in the left face, and slurred speach. He denies any subsequent neurological event. He is on aspirin. He is on a statin.  He had stroke on 11-12-13, and again on 11-15-13 as manifested by slurred speech and left hemiparesis; has has residual weakness and numbness in his left hand, speech cleared.   With respect to his claudication he states that he does not have any significant calf thigh or hip claudication on either side. He has the known left common iliac artery occlusion. He quit smoking in 2014 and tries to walk as much as possible. He denies any history of rest pain. He denies any history of nonhealing ulcers.  Pt states that his dyspnea limits his walking.  Pt states his blood pressure at home is 130-140's/ 50's  Pt Diabetic: No Pt smoker: former smoker, quit in 2014  Pt meds include: Statin :Yes ASA: Yes Other anticoagulants/antiplatelets: no     Current Outpatient Medications  Medication Sig Dispense Refill  . amLODipine (NORVASC) 5 MG tablet Take 5 mg by mouth daily.    Marland Kitchen aspirin EC 81 MG EC tablet Take 1 tablet (81 mg total) by mouth daily.    . diclofenac sodium (VOLTAREN) 1 % GEL Apply 1 application topically daily as needed for pain.    . fluticasone (CUTIVATE) 0.05 % cream Apply 1 application topically daily as needed (fungus). Apply to face and neck    . ibuprofen (ADVIL,MOTRIN) 200 MG tablet Take 400  mg by mouth daily as needed for headache or moderate pain.    Marland Kitchen omeprazole (PRILOSEC) 20 MG capsule Take 20 mg by mouth daily.    . simvastatin (ZOCOR) 40 MG tablet Take 40 mg by mouth every evening.     No current facility-administered medications for this visit.     Past Medical History:  Diagnosis Date  . CHF (congestive heart failure) ([place])   . Chronic cough   . COPD (chronic obstructive pulmonary disease) ([place])   . Coronary artery disease   . 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 Hyperlipidemia   . Hypertension   . Myocardial infarction ([place])   . Peripheral vascular disease ([place])   . Reflux   . Wheezing     Social History Social History   Tobacco Use  . Smoking status: Former Smoker    Packs/day: 1.00    Years: 51.00    Pack years: 51.00    Types: Cigarettes    Last attempt to quit: 04/03/2013    Years since quitting: 4.9  . Smokeless tobacco: Never Used  Substance Use Topics  . Alcohol use: No    Alcohol/week: 0.0 oz  . Drug use: No    Family History Family History  Problem  Relation Age of Onset  . Cancer Mother   . Deep vein thrombosis Father   . Heart disease Father   . Hyperlipidemia Father   . Hypertension Father   . Heart attack Father   . Peripheral vascular disease Father   . Cancer Father   . Heart disease Unknown   . Arthritis Unknown   . Cancer Unknown   . Kidney disease Unknown   . Heart disease Brother   . Hyperlipidemia Brother   . Heart attack Brother     Surgical History Past Surgical History:  Procedure Laterality Date  . CERVICAL DISC SURGERY     Dr Vertell Limber  . CORONARY ANGIOPLASTY WITH STENT PLACEMENT  2003  . LOOP RECORDER IMPLANT  11/19/2013   MDT LinQ implanted by Dr Caryl Comes for cryptogenic stroke  . LOOP  RECORDER IMPLANT N/A 11/19/2013   Procedure: LOOP RECORDER IMPLANT;  Surgeon: Deboraha Sprang, MD;  Location: [facility] CATH LAB;  Service: Cardiovascular;  Laterality: N/A;  . LOOP RECORDER REMOVAL N/A 12/29/2016   Procedure: Loop Recorder Removal;  Surgeon: Deboraha Sprang, MD;  Location: [place] CV LAB;  Service: Cardiovascular;  Laterality: N/A;  . TEE WITHOUT CARDIOVERSION N/A 11/19/2013   Procedure: TRANSESOPHAGEAL ECHOCARDIOGRAM (TEE);  Surgeon: Josue Hector, MD;  Location: [facility] ENDOSCOPY;  Service: Cardiovascular;  Laterality: N/A;    Allergies  Allergen Reactions  . Ivp Dye [Iodinated Diagnostic Agents] Shortness Of Breath    Current Outpatient Medications  Medication Sig Dispense Refill  . amLODipine (NORVASC) 5 MG tablet Take 5 mg by mouth daily.    Marland Kitchen aspirin EC 81 MG EC tablet Take 1 tablet (81 mg total) by mouth daily.    . diclofenac sodium (VOLTAREN) 1 % GEL Apply 1 application topically daily as needed for pain.    . fluticasone (CUTIVATE) 0.05 % cream Apply 1 application topically daily as needed (fungus). Apply to face and neck    . ibuprofen (ADVIL,MOTRIN) 200 MG tablet Take 400 mg by mouth daily as needed for headache or moderate pain.    Marland Kitchen omeprazole (PRILOSEC) 20 MG capsule Take 20 mg by mouth daily.    . simvastatin (ZOCOR) 40 MG tablet Take 40 mg by mouth every evening.     No current facility-administered medications for this visit.      REVIEW OF SYSTEMS: See HPI for pertinent positives and negatives.  Physical Examination Vitals:   02/28/18 1513 02/28/18 1515  BP: (!) 154/91 (!) 156/83  Pulse: 78   Resp: 20   Temp: 98.1 F (36.7 C)   TempSrc: Oral   SpO2: 93%   Weight: 214 lb (97.1 kg)   Height: 5\' 10"  (1.778 m)    Body mass index is 30.71 kg/m.  General: WDWN in NAD, obese male Gait: Normal HENT: WNL Eyes: Pupils equal, left lower eyelid ectropion, bilateral ptosis, left worse than right Pulmonary: normal non-labored breathing at rest, no rales,  rhonchi, or wheezing Cardiac: RRR, no murmur detected Abdomen: soft, NT, asymptomatic reducible umbilical and ventral hernias. Large panus.  Skin: no rashes, no ulcers; no cellulitis.  VASCULAR EXAM  Carotid Bruits Right Left   Negative Negative   Abdominal aortic pulse is not palpable Radial pulses are 2+ palpable   VASCULAR EXAM: Extremitieswithoutischemic changes, withoutGangrene; withoutopen wounds. Feet and toes are pink and warm with brisk capillary refill.   LE Pulses Right Left  FEMORAL 2+ palpable  not palpable   POPLITEAL not palpable  not palpable  POSTERIOR TIBIAL  not palpable  not palpable   DORSALIS PEDIS ANTERIOR TIBIAL 1+ palpable  not palpable     Musculoskeletal: no muscle wasting or atrophy;  M/S 5/5 throughout, no peripheral edema. Neurologic:  A&O X 3; appropriate affect, sensation is normal; speech is normal, CN 2-12 intact, pain and light touch intact in extremities, motor exam as listed above. Psychiatric: Normal thought content, mood appropriate to clinical situation.    ASSESSMENT:  CURBY CARSWELL is a 72 y.o. male whohas a known left common iliac artery occlusion based on arteriogram from 2008.  He had a right brain stroke in 2014 associated with weakness in the left hand and some paresthesias in the left face, and slurred speech, no subsequent neurological event. He is on aspirin. He is on a statin.  With respect to his claudication he states that he does not have any significant calf thigh or hip claudication on either side, states that his dyspnea limits his walking. He quit smoking in 2014 and tries to walk as much as possible.   DATA  Carotid Duplex (02/28/18); Right ICA: 40-59%  Stenosis Left: 1-39% stenosis Bilateral vertebral artery flow is antegrade.  Bilateral subclavian artery waveforms are normal.  No change compared to the exams on 02-04-16  and 02-09-17.   ABI (Date: 02/28/2018):  R:   ABI: 0.83 (was 0.81 on 02-09-17),   PT: bi  DP: bi  TBI:  0.54 (was 0.47)  L:   ABI: 067 (was 0.72),   PT: mono  DP: bi  TBI: 0.41 (was 0.44)  Stable bilateral ABI and TBI, bi and monophasic waveforms. Mild disease in the right, moderate in the left.    PLAN:   Graduated walking program discussed and how to achieve.   Based on today's exam and non-invasive vascular lab results, the patient will follow up in 1 year with the following tests: carotid duplex and ABI's. I advised him to notify us if he develops concerns re the circulation in his feet/legs.    I discussed in depth with the patient the nature of atherosclerosis, and emphasized the importance of maximal medical management including strict control of blood pressure, blood glucose, and lipid levels, obtaining regular exercise, and cessation of smoking.  The patient is aware that without maximal medical management the underlying atherosclerotic disease process will progress, limiting the benefit of any interventions.  The patient was given information about stroke prevention and what symptoms should prompt the patient to seek immediate medical care.  The patient was given information about PAD including signs, symptoms, treatment, what symptoms should prompt the patient to seek immediate medical care, and risk reduction measures to take.  Thank you for allowing [place] to participate in this patient's care.  Joshua Chambers, RN, MSN, FNP-C [facility] Office: [phone]  Clinic MD: Scot Dock on call 02/28/2018 3:24 PM

## 2018-04-14 DIAGNOSIS — H10502 Unspecified blepharoconjunctivitis, left eye: Secondary | ICD-10-CM | POA: Diagnosis not present

## 2018-04-14 DIAGNOSIS — H11003 Unspecified pterygium of eye, bilateral: Secondary | ICD-10-CM | POA: Diagnosis not present

## 2018-04-14 DIAGNOSIS — H1045 Other chronic allergic conjunctivitis: Secondary | ICD-10-CM | POA: Diagnosis not present

## 2018-04-14 DIAGNOSIS — H2513 Age-related nuclear cataract, bilateral: Secondary | ICD-10-CM | POA: Diagnosis not present

## 2018-05-24 DIAGNOSIS — I739 Peripheral vascular disease, unspecified: Secondary | ICD-10-CM | POA: Diagnosis not present

## 2018-05-24 DIAGNOSIS — J449 Chronic obstructive pulmonary disease, unspecified: Secondary | ICD-10-CM | POA: Diagnosis not present

## 2018-05-24 DIAGNOSIS — I10 Essential (primary) hypertension: Secondary | ICD-10-CM | POA: Diagnosis not present

## 2018-05-24 DIAGNOSIS — J301 Allergic rhinitis due to pollen: Secondary | ICD-10-CM | POA: Diagnosis not present

## 2018-05-24 DIAGNOSIS — R7301 Impaired fasting glucose: Secondary | ICD-10-CM | POA: Diagnosis not present

## 2018-05-24 DIAGNOSIS — K219 Gastro-esophageal reflux disease without esophagitis: Secondary | ICD-10-CM | POA: Diagnosis not present

## 2018-05-24 DIAGNOSIS — I2510 Atherosclerotic heart disease of native coronary artery without angina pectoris: Secondary | ICD-10-CM | POA: Diagnosis not present

## 2018-06-08 DIAGNOSIS — H25813 Combined forms of age-related cataract, bilateral: Secondary | ICD-10-CM | POA: Diagnosis not present

## 2018-06-08 DIAGNOSIS — H25812 Combined forms of age-related cataract, left eye: Secondary | ICD-10-CM | POA: Diagnosis not present

## 2018-06-09 NOTE — Patient Instructions (Signed)
Your procedure is scheduled on: 06/16/2018  Report to [facility] at  745   AM.  Call this number if you have problems the morning of surgery: [phone]   Do not eat food or drink liquids :After Midnight.      Take these medicines the morning of surgery with A SIP OF WATER: amlodipine.   Do not wear jewelry, make-up or nail polish.  Do not wear lotions, powders, or perfumes. You may wear deodorant.  Do not shave 48 hours prior to surgery.  Do not bring valuables to the hospital.  Contacts, dentures or bridgework may not be worn into surgery.  Leave suitcase in the car. After surgery it may be brought to your room.  For patients admitted to the hospital, checkout time is 11:00 AM the day of discharge.   Patients discharged the day of surgery will not be allowed to drive home.  :     Please read over the following fact sheets that you were given: Coughing and Deep Breathing, Surgical Site Infection Prevention, Anesthesia Post-op Instructions and Care and Recovery After Surgery    Cataract A cataract is a clouding of the lens of the eye. When a lens becomes cloudy, vision is reduced based on the degree and nature of the clouding. Many cataracts reduce vision to some degree. Some cataracts make people more near-sighted as they develop. Other cataracts increase glare. Cataracts that are ignored and become worse can sometimes look white. The white color can be seen through the pupil. CAUSES   Aging. However, cataracts may occur at any age, even in newborns.   Certain drugs.   Trauma to the eye.   Certain diseases such as diabetes.   Specific eye diseases such as chronic inflammation inside the eye or a sudden attack of a rare form of glaucoma.   Inherited or acquired medical problems.  SYMPTOMS   Gradual, progressive drop in vision in the affected eye.   Severe, rapid visual loss. This most often happens when trauma is the cause.  DIAGNOSIS  To detect a cataract, an eye doctor  examines the lens. Cataracts are best diagnosed with an exam of the eyes with the pupils enlarged (dilated) by drops.  TREATMENT  For an early cataract, vision may improve by using different eyeglasses or stronger lighting. If that does not help your vision, surgery is the only effective treatment. A cataract needs to be surgically removed when vision loss interferes with your everyday activities, such as driving, reading, or watching TV. A cataract may also have to be removed if it prevents examination or treatment of another eye problem. Surgery removes the cloudy lens and usually replaces it with a substitute lens (intraocular lens, IOL).  At a time when both you and your doctor agree, the cataract will be surgically removed. If you have cataracts in both eyes, only one is usually removed at a time. This allows the operated eye to heal and be out of danger from any possible problems after surgery (such as infection or poor wound healing). In rare cases, a cataract may be doing damage to your eye. In these cases, your caregiver may advise surgical removal right away. The vast majority of people who have cataract surgery have better vision afterward. HOME CARE INSTRUCTIONS  If you are not planning surgery, you may be asked to do the following:  Use different eyeglasses.   Use stronger or brighter lighting.   Ask your eye doctor about reducing your medicine dose  or changing medicines if it is thought that a medicine caused your cataract. Changing medicines does not make the cataract go away on its own.   Become familiar with your surroundings. Poor vision can lead to injury. Avoid bumping into things on the affected side. You are at a higher risk for tripping or falling.   Exercise extreme care when driving or operating machinery.   Wear sunglasses if you are sensitive to bright light or experiencing problems with glare.  SEEK IMMEDIATE MEDICAL CARE IF:   You have a worsening or sudden vision  loss.   You notice redness, swelling, or increasing pain in the eye.   You have a fever.  Document Released: 11/01/2005 Document Revised: 10/21/2011 Document Reviewed: 06/25/2011 [facility] Patient Information 2012 [place].PATIENT INSTRUCTIONS POST-ANESTHESIA  IMMEDIATELY FOLLOWING SURGERY:  Do not drive or operate machinery for the first twenty four hours after surgery.  Do not make any important decisions for twenty four hours after surgery or while taking narcotic pain medications or sedatives.  If you develop intractable nausea and vomiting or a severe headache please notify your doctor immediately.  FOLLOW-UP:  Please make an appointment with your surgeon as instructed. You do not need to follow up with anesthesia unless specifically instructed to do so.  WOUND CARE INSTRUCTIONS (if applicable):  Keep a dry clean dressing on the anesthesia/puncture wound site if there is drainage.  Once the wound has quit draining you may leave it open to air.  Generally you should leave the bandage intact for twenty four hours unless there is drainage.  If the epidural site drains for more than 36-48 hours please call the anesthesia department.  QUESTIONS?:  Please feel free to call your physician or the hospital operator if you have any questions, and they will be happy to assist you.

## 2018-06-13 ENCOUNTER — Encounter (HOSPITAL_COMMUNITY): Payer: Self-pay

## 2018-06-13 ENCOUNTER — Other Ambulatory Visit: Payer: Self-pay

## 2018-06-13 ENCOUNTER — Encounter (HOSPITAL_COMMUNITY)
Admission: RE | Admit: 2018-06-13 | Discharge: 2018-06-13 | Disposition: A | Discharge status: Home / Self Care | Payer: Medicare Other | Source: Ambulatory Visit | Attending: Ophthalmology | Admitting: Ophthalmology

## 2018-06-13 DIAGNOSIS — I498 Other specified cardiac arrhythmias: Secondary | ICD-10-CM | POA: Diagnosis not present

## 2018-06-13 DIAGNOSIS — I252 Old myocardial infarction: Secondary | ICD-10-CM | POA: Diagnosis not present

## 2018-06-13 DIAGNOSIS — H269 Unspecified cataract: Secondary | ICD-10-CM | POA: Diagnosis not present

## 2018-06-13 DIAGNOSIS — Z8673 Personal history of transient ischemic attack (TIA), and cerebral infarction without residual deficits: Secondary | ICD-10-CM | POA: Diagnosis not present

## 2018-06-13 DIAGNOSIS — Z955 Presence of coronary angioplasty implant and graft: Secondary | ICD-10-CM | POA: Diagnosis not present

## 2018-06-13 DIAGNOSIS — I110 Hypertensive heart disease with heart failure: Secondary | ICD-10-CM | POA: Diagnosis not present

## 2018-06-13 DIAGNOSIS — I509 Heart failure, unspecified: Secondary | ICD-10-CM | POA: Diagnosis not present

## 2018-06-13 DIAGNOSIS — H2181 Floppy iris syndrome: Secondary | ICD-10-CM | POA: Diagnosis not present

## 2018-06-13 DIAGNOSIS — I2510 Atherosclerotic heart disease of native coronary artery without angina pectoris: Secondary | ICD-10-CM | POA: Diagnosis not present

## 2018-06-13 DIAGNOSIS — I739 Peripheral vascular disease, unspecified: Secondary | ICD-10-CM | POA: Diagnosis not present

## 2018-06-13 DIAGNOSIS — J449 Chronic obstructive pulmonary disease, unspecified: Secondary | ICD-10-CM | POA: Diagnosis not present

## 2018-06-13 DIAGNOSIS — Z91041 Radiographic dye allergy status: Secondary | ICD-10-CM | POA: Diagnosis not present

## 2018-06-13 DIAGNOSIS — Z79899 Other long term (current) drug therapy: Secondary | ICD-10-CM | POA: Diagnosis not present

## 2018-06-13 DIAGNOSIS — Z87891 Personal history of nicotine dependence: Secondary | ICD-10-CM | POA: Diagnosis not present

## 2018-06-13 DIAGNOSIS — Z7982 Long term (current) use of aspirin: Secondary | ICD-10-CM | POA: Diagnosis not present

## 2018-06-13 HISTORY — DX: Prediabetes: R73.03

## 2018-06-13 HISTORY — DX: Gastro-esophageal reflux disease without esophagitis: K21.9

## 2018-06-13 HISTORY — DX: Unspecified osteoarthritis, unspecified site: M19.90

## 2018-06-13 HISTORY — DX: Malignant (primary) neoplasm, unspecified: C80.1

## 2018-06-13 LAB — CBC WITH DIFFERENTIAL/PLATELET
BASOS PCT: 1 %
Basophils Absolute: 0 10*3/uL (ref 0.0–0.1)
EOS ABS: 0.3 10*3/uL (ref 0.0–0.7)
EOS PCT: 3 %
HCT: 48.1 % (ref 39.0–52.0)
HEMOGLOBIN: 15.7 g/dL (ref 13.0–17.0)
LYMPHS ABS: 1.9 10*3/uL (ref 0.7–4.0)
Lymphocytes Relative: 26 %
MCH: 29.3 pg (ref 26.0–34.0)
MCHC: 32.6 g/dL (ref 30.0–36.0)
MCV: 89.9 fL (ref 78.0–100.0)
Monocytes Absolute: 0.5 10*3/uL (ref 0.1–1.0)
Monocytes Relative: 7 %
Neutro Abs: 4.6 10*3/uL (ref 1.7–7.7)
Neutrophils Relative %: 63 %
PLATELETS: 209 10*3/uL (ref 150–400)
RBC: 5.35 MIL/uL (ref 4.22–5.81)
RDW: 13.7 % (ref 11.5–15.5)
WBC: 7.3 10*3/uL (ref 4.0–10.5)

## 2018-06-13 LAB — BASIC METABOLIC PANEL
Anion gap: 9 (ref 5–15)
BUN: 8 mg/dL (ref 8–23)
CHLORIDE: 104 mmol/L (ref 98–111)
CO2: 27 mmol/L (ref 22–32)
Calcium: 9.5 mg/dL (ref 8.9–10.3)
Creatinine, Ser: 0.88 mg/dL (ref 0.61–1.24)
Glucose, Bld: 189 mg/dL — ABNORMAL HIGH (ref 70–99)
Potassium: 3.7 mmol/L (ref 3.5–5.1)
SODIUM: 140 mmol/L (ref 135–145)

## 2018-06-13 NOTE — Progress Notes (Signed)
   06/13/18 0925  OBSTRUCTIVE SLEEP APNEA  Have you ever been diagnosed with sleep apnea through a sleep study? No  If yes, do you have and use a CPAP or BPAP machine every night? 0  Do you snore loudly (loud enough to be heard through closed doors)?  1  Do you often feel tired, fatigued, or sleepy during the daytime (such as falling asleep during driving or talking to someone)? 0  Has anyone observed you stop breathing during your sleep? 1  Do you have, or are you being treated for high blood pressure? 1  BMI more than 35 kg/m2? 0  Age > 50 (1-yes) 1  Neck circumference greater than:Male 16 inches or larger, Male 17inches or larger? 0  Male Gender (Yes=1) 1  Obstructive Sleep Apnea Score 5  Score 5 or greater  Results sent to PCP

## 2018-06-16 ENCOUNTER — Other Ambulatory Visit: Payer: Self-pay

## 2018-06-16 ENCOUNTER — Encounter (HOSPITAL_COMMUNITY)
Admission: RE | Disposition: A | Discharge status: Home / Self Care | Payer: Self-pay | Source: Ambulatory Visit | Attending: Ophthalmology

## 2018-06-16 ENCOUNTER — Ambulatory Visit (HOSPITAL_COMMUNITY): Payer: Medicare Other | Admitting: Anesthesiology

## 2018-06-16 ENCOUNTER — Ambulatory Visit (HOSPITAL_COMMUNITY)
Admission: RE | Admit: 2018-06-16 | Discharge: 2018-06-16 | Disposition: A | Discharge status: Home / Self Care | Payer: Medicare Other | Source: Ambulatory Visit | Attending: Ophthalmology | Admitting: Ophthalmology

## 2018-06-16 ENCOUNTER — Encounter (HOSPITAL_COMMUNITY): Payer: Self-pay

## 2018-06-16 DIAGNOSIS — Z79899 Other long term (current) drug therapy: Secondary | ICD-10-CM | POA: Diagnosis not present

## 2018-06-16 DIAGNOSIS — Z91041 Radiographic dye allergy status: Secondary | ICD-10-CM | POA: Diagnosis not present

## 2018-06-16 DIAGNOSIS — H269 Unspecified cataract: Secondary | ICD-10-CM | POA: Diagnosis not present

## 2018-06-16 DIAGNOSIS — Z955 Presence of coronary angioplasty implant and graft: Secondary | ICD-10-CM | POA: Insufficient documentation

## 2018-06-16 DIAGNOSIS — I252 Old myocardial infarction: Secondary | ICD-10-CM | POA: Diagnosis not present

## 2018-06-16 DIAGNOSIS — H2181 Floppy iris syndrome: Secondary | ICD-10-CM | POA: Diagnosis not present

## 2018-06-16 DIAGNOSIS — I509 Heart failure, unspecified: Secondary | ICD-10-CM | POA: Insufficient documentation

## 2018-06-16 DIAGNOSIS — I2510 Atherosclerotic heart disease of native coronary artery without angina pectoris: Secondary | ICD-10-CM | POA: Insufficient documentation

## 2018-06-16 DIAGNOSIS — Z87891 Personal history of nicotine dependence: Secondary | ICD-10-CM | POA: Insufficient documentation

## 2018-06-16 DIAGNOSIS — I110 Hypertensive heart disease with heart failure: Secondary | ICD-10-CM | POA: Insufficient documentation

## 2018-06-16 DIAGNOSIS — I10 Essential (primary) hypertension: Secondary | ICD-10-CM | POA: Diagnosis not present

## 2018-06-16 DIAGNOSIS — J449 Chronic obstructive pulmonary disease, unspecified: Secondary | ICD-10-CM | POA: Diagnosis not present

## 2018-06-16 DIAGNOSIS — H25812 Combined forms of age-related cataract, left eye: Secondary | ICD-10-CM | POA: Diagnosis not present

## 2018-06-16 DIAGNOSIS — H2512 Age-related nuclear cataract, left eye: Secondary | ICD-10-CM | POA: Diagnosis not present

## 2018-06-16 DIAGNOSIS — Z8673 Personal history of transient ischemic attack (TIA), and cerebral infarction without residual deficits: Secondary | ICD-10-CM | POA: Insufficient documentation

## 2018-06-16 DIAGNOSIS — Z7982 Long term (current) use of aspirin: Secondary | ICD-10-CM | POA: Insufficient documentation

## 2018-06-16 DIAGNOSIS — I498 Other specified cardiac arrhythmias: Secondary | ICD-10-CM | POA: Insufficient documentation

## 2018-06-16 DIAGNOSIS — I739 Peripheral vascular disease, unspecified: Secondary | ICD-10-CM | POA: Insufficient documentation

## 2018-06-16 HISTORY — PX: CATARACT EXTRACTION W/PHACO: SHX586

## 2018-06-16 SURGERY — PHACOEMULSIFICATION, CATARACT, WITH IOL INSERTION
Anesthesia: Monitor Anesthesia Care | Site: Eye | Laterality: Left

## 2018-06-16 MED ORDER — POVIDONE-IODINE 5 % OP SOLN
OPHTHALMIC | Status: DC | PRN
  Administered 2018-06-16: 1 via OPHTHALMIC

## 2018-06-16 MED ORDER — EPINEPHRINE PF 1 MG/ML IJ SOLN
INTRAOCULAR | Status: DC | PRN
  Administered 2018-06-16: 500 mL

## 2018-06-16 MED ORDER — TETRACAINE HCL 0.5 % OP SOLN
1.0000 [drp] | OPHTHALMIC | Status: AC
  Administered 2018-06-16 (×3): 1 [drp] via OPHTHALMIC

## 2018-06-16 MED ORDER — SODIUM HYALURONATE 23 MG/ML IO SOLN
INTRAOCULAR | Status: DC | PRN
  Administered 2018-06-16: 0.6 mL via INTRAOCULAR

## 2018-06-16 MED ORDER — FENTANYL CITRATE (PF) 100 MCG/2ML IJ SOLN
INTRAMUSCULAR | Status: AC
  Filled 2018-06-16: qty 2

## 2018-06-16 MED ORDER — LACTATED RINGERS IV SOLN
INTRAVENOUS | Status: DC
  Administered 2018-06-16: 08:00:00 via INTRAVENOUS

## 2018-06-16 MED ORDER — MIDAZOLAM HCL 2 MG/2ML IJ SOLN
INTRAMUSCULAR | Status: AC
  Filled 2018-06-16: qty 2

## 2018-06-16 MED ORDER — PHENYLEPHRINE HCL 2.5 % OP SOLN
1.0000 [drp] | OPHTHALMIC | Status: AC
  Administered 2018-06-16 (×3): 1 [drp] via OPHTHALMIC

## 2018-06-16 MED ORDER — NEOMYCIN-POLYMYXIN-DEXAMETH 3.5-10000-0.1 OP SUSP
OPHTHALMIC | Status: DC | PRN
  Administered 2018-06-16: 2 [drp] via OPHTHALMIC

## 2018-06-16 MED ORDER — LIDOCAINE HCL 3.5 % OP GEL
1.0000 "application " | Freq: Once | OPHTHALMIC | Status: AC
  Administered 2018-06-16: 1 via OPHTHALMIC

## 2018-06-16 MED ORDER — CYCLOPENTOLATE-PHENYLEPHRINE 0.2-1 % OP SOLN
1.0000 [drp] | OPHTHALMIC | Status: AC
  Administered 2018-06-16 (×3): 1 [drp] via OPHTHALMIC

## 2018-06-16 MED ORDER — PROVISC 10 MG/ML IO SOLN
INTRAOCULAR | Status: DC | PRN
  Administered 2018-06-16: 0.85 mL via INTRAOCULAR

## 2018-06-16 MED ORDER — LIDOCAINE HCL (PF) 1 % IJ SOLN
INTRAOCULAR | Status: DC | PRN
  Administered 2018-06-16: 1 mL via OPHTHALMIC

## 2018-06-16 MED ORDER — MIDAZOLAM HCL 2 MG/2ML IJ SOLN
INTRAMUSCULAR | Status: DC | PRN
  Administered 2018-06-16 (×2): 1 mg via INTRAVENOUS

## 2018-06-16 MED ORDER — BSS IO SOLN
INTRAOCULAR | Status: DC | PRN
  Administered 2018-06-16: 15 mL

## 2018-06-16 SURGICAL SUPPLY — 14 items
CLOTH BEACON ORANGE TIMEOUT ST (SAFETY) ×1 IMPLANT
EYE SHIELD UNIVERSAL CLEAR (GAUZE/BANDAGES/DRESSINGS) ×1 IMPLANT
GLOVE BIOGEL PI IND STRL 7.0 (GLOVE) IMPLANT
GLOVE BIOGEL PI INDICATOR 7.0 (GLOVE) ×2
LENS ALC ACRYL/TECN (Ophthalmic Related) ×1 IMPLANT
NDL HYPO 18GX1.5 BLUNT FILL (NEEDLE) IMPLANT
NEEDLE HYPO 18GX1.5 BLUNT FILL (NEEDLE) ×2 IMPLANT
PAD ARMBOARD 7.5X6 YLW CONV (MISCELLANEOUS) ×1 IMPLANT
RING MALYGIN (MISCELLANEOUS) ×1 IMPLANT
SYR TB 1ML LL NO SAFETY (SYRINGE) ×1 IMPLANT
TAPE SURG TRANSPORE 1 IN (GAUZE/BANDAGES/DRESSINGS) IMPLANT
TAPE SURGICAL TRANSPORE 1 IN (GAUZE/BANDAGES/DRESSINGS) ×1
VISCOELASTIC ADDITIONAL (OPHTHALMIC RELATED) ×1 IMPLANT
WATER STERILE IRR 250ML POUR (IV SOLUTION) ×1 IMPLANT

## 2018-06-16 NOTE — Anesthesia Postprocedure Evaluation (Signed)
Anesthesia Post Note  Patient: [name]  Procedure(s) Performed: CATARACT EXTRACTION PHACO AND INTRAOCULAR LENS PLACEMENT ([place]) (Left Eye)  Patient location during evaluation: PACU Anesthesia Type: MAC Level of consciousness: awake and alert and oriented Pain management: pain level controlled Vital Signs Assessment: post-procedure vital signs reviewed and stable Respiratory status: spontaneous breathing, nonlabored ventilation, respiratory function stable and patient connected to nasal cannula oxygen Cardiovascular status: stable Postop Assessment: no apparent nausea or vomiting Anesthetic complications: no     Last Vitals:  Vitals:   06/16/18 0801  BP: (!) 156/84  Pulse: 68  Resp: 16  Temp: 36.5 C  SpO2: 95%    Last Pain:  Vitals:   06/16/18 0801  TempSrc: Oral  PainSc: 0-No pain                 Joshua Horton

## 2018-06-16 NOTE — Op Note (Signed)
Date of procedure: 06/16/18  Pre-operative diagnosis: Visually significant cataract, Left Eye; Poor dilation, Left eye (H25.812; H21.81)  Post-operative diagnosis: Visually significant cataract, Left Eye; Intra-operative Floppy Iris Syndrome, Left Eye  Procedure: Complex removal of cataract via phacoemulsification and insertion of intra-ocular lens [employer] Vision PCB00  +21.0D into the capsular bag of the Left Eye  Attending surgeon: Gerda Diss. Najmo Pardue, MD, MA  Anesthesia: MAC, Topical Akten  Complications: None  Estimated Blood Loss: <46m (minimal)  Specimens: None  Implants: As above  Indications:  Visually significant cataract, Left Eye  Procedure:  The patient was seen and identified in the pre-operative area. The operative eye was identified and dilated.  The operative eye was marked.  Topical anesthesia was administered to the operative eye.     The patient was then to the operative suite and placed in the supine position.  A timeout was performed confirming the patient, procedure to be performed, and all other relevant information.   The patient's face was prepped and draped in the usual fashion for intra-ocular surgery.  A lid speculum was placed into the operative eye and the surgical microscope moved into place and focused.  Very poor dilation of the iris secondary to Flomax use was confirmed.  An inferotemporal paracentesis was created using a 20 gauge paracentesis blade.  Shugarcaine was injected into the anterior chamber.  Viscoelastic was injected into the anterior chamber.  A temporal clear-corneal main wound incision was created using a 2.412mmicrokeratome.  A Malyugin ring was placed.  A continuous curvilinear capsulorrhexis was initiated using an irrigating cystitome and completed using capsulorrhexis forceps.  Hydrodissection and hydrodeliniation were performed.  Viscoelastic was injected into the anterior chamber.  A phacoemulsification handpiece and a chopper  as a second instrument were used to remove the nucleus and epinucleus. The irrigation/aspiration handpiece was used to remove any remaining cortical material.   The capsular bag was reinflated with viscoelastic, checked, and found to be intact.  The intraocular lens was inserted into the capsular bag and dialed into place using a Kuglen hook. The Malyugin ring was removed.  The irrigation/aspiration handpiece was used to remove any remaining viscoelastic.  The clear corneal wound and paracentesis wounds were then hydrated and checked with Weck-Cels to be watertight.  The lid-speculum and drape was removed, and the patient's face was cleaned with a wet and dry 4x4.  Maxitrol was instilled in the eye before a clear shield was taped over the eye. The patient was taken to the post-operative care unit in good condition, having tolerated the procedure well.  Post-Op Instructions: [facility] and will receive all other orders and instructions.

## 2018-06-16 NOTE — Anesthesia Preprocedure Evaluation (Signed)
Anesthesia Evaluation  Patient identified by MRN, date of birth, ID band Patient awake    Reviewed: Allergy & Precautions, H&P , NPO status , Patient's Chart, lab work & pertinent test results, reviewed documented beta blocker date and time   Airway Mallampati: III  TM Distance: >3 FB Neck ROM: full    Dental no notable dental hx. (+) Edentulous Upper, Edentulous Lower   Pulmonary neg pulmonary ROS, COPD, former smoker,    Pulmonary exam normal breath sounds clear to auscultation       Cardiovascular Exercise Tolerance: Good hypertension, + CAD, + Past MI, + Peripheral Vascular Disease and +CHF  negative cardio ROS   Rhythm:regular Rate:Normal     Neuro/Psych CVA negative neurological ROS  negative psych ROS   GI/Hepatic negative GI ROS, Neg liver ROS, GERD  ,  Endo/Other  negative endocrine ROS  Renal/GU negative Renal ROS  negative genitourinary   Musculoskeletal   Abdominal   Peds  Hematology negative hematology ROS (+)   Anesthesia Other Findings SR with sinus arrhythmia 75  Reproductive/Obstetrics negative OB ROS                             Anesthesia Physical Anesthesia Plan  ASA: III  Anesthesia Plan: MAC   Post-op Pain Management:    Induction:   PONV Risk Score and Plan:   Airway Management Planned:   Additional Equipment:   Intra-op Plan:   Post-operative Plan:   Informed Consent: I have reviewed the patients History and Physical, chart, labs and discussed the procedure including the risks, benefits and alternatives for the proposed anesthesia with the patient or authorized representative who has indicated his/her understanding and acceptance.   Dental Advisory Given  Plan Discussed with: CRNA and Anesthesiologist  Anesthesia Plan Comments:         Anesthesia Quick Evaluation

## 2018-06-16 NOTE — Discharge Instructions (Addendum)
Please discharge patient when stable, will follow up today with Dr. Wrzosek at the [facility] office immediately following discharge.  Leave shield in place until visit.  All paperwork with discharge instructions will be given at the office. ° ° °Monitored Anesthesia Care, Care After °These instructions provide you with information about caring for yourself after your procedure. Your health care provider may also give you more specific instructions. Your treatment has been planned according to current medical practices, but problems sometimes occur. Call your health care provider if you have any problems or questions after your procedure. °What can I expect after the procedure? °After your procedure, it is common to: °· Feel sleepy for several hours. °· Feel clumsy and have poor balance for several hours. °· Feel forgetful about what happened after the procedure. °· Have poor judgment for several hours. °· Feel nauseous or vomit. °· Have a sore throat if you had a breathing tube during the procedure. ° °Follow these instructions at home: °For at least 24 hours after the procedure: ° °· Do not: °? Participate in activities in which you could fall or become injured. °? Drive. °? Use heavy machinery. °? Drink alcohol. °? Take sleeping pills or medicines that cause drowsiness. °? Make important decisions or sign legal documents. °? Take care of children on your own. °· Rest. °Eating and drinking °· Follow the diet that is recommended by your health care provider. °· If you vomit, drink water, juice, or soup when you can drink without vomiting. °· Make sure you have little or no nausea before eating solid foods. °General instructions °· Have a responsible adult stay with you until you are awake and alert. °· Take over-the-counter and prescription medicines only as told by your health care provider. °· If you smoke, do not smoke without supervision. °· Keep all follow-up visits as told by your health care  provider. This is important. °Contact a health care provider if: °· You keep feeling nauseous or you keep vomiting. °· You feel light-headed. °· You develop a rash. °· You have a fever. °Get help right away if: °· You have trouble breathing. °This information is not intended to replace advice given to you by your health care provider. Make sure you discuss any questions you have with your health care provider. °Document Released: 02/22/2016 Document Revised: 06/23/2016 Document Reviewed: 02/22/2016 °Elsevier Interactive Patient Education © 2018 [employer] ° °

## 2018-06-16 NOTE — Transfer of Care (Signed)
Immediate Anesthesia Transfer of Care Note  Patient: Joshua Horton [facility]  Procedure(s) Performed: CATARACT EXTRACTION PHACO AND INTRAOCULAR LENS PLACEMENT ([facility]) (Left Eye)  Patient Location: PACU  Anesthesia Type:MAC  Level of Consciousness: awake, alert  and oriented  Airway & Oxygen Therapy: Patient Spontanous Breathing and Patient connected to nasal cannula oxygen  Post-op Assessment: Report given to RN and Post -op Vital signs reviewed and stable  Post vital signs: Reviewed and stable  Last Vitals:  Vitals Value Taken Time  BP    Temp    Pulse    Resp    SpO2      Last Pain:  Vitals:   06/16/18 0801  TempSrc: Oral  PainSc: 0-No pain      Patients Stated Pain Goal: 5 (25/00/37 0488)  Complications: No apparent anesthesia complications

## 2018-06-16 NOTE — H&P (Signed)
The H and P was reviewed and updated. The patient was examined.  No changes were found after exam.  The surgical eye was marked.  

## 2018-06-16 NOTE — Anesthesia Procedure Notes (Signed)
Procedure Name: MAC Date/Time: 06/16/2018 9:24 AM Performed by: Vista Deck, CRNA Pre-anesthesia Checklist: Patient identified, Emergency Drugs available, Suction available, Timeout performed and Patient being monitored Patient Re-evaluated:Patient Re-evaluated prior to induction Oxygen Delivery Method: Nasal Cannula

## 2018-06-19 ENCOUNTER — Encounter (HOSPITAL_COMMUNITY): Payer: Self-pay | Admitting: Ophthalmology

## 2018-06-20 DIAGNOSIS — H25811 Combined forms of age-related cataract, right eye: Secondary | ICD-10-CM | POA: Diagnosis not present

## 2018-06-27 ENCOUNTER — Encounter (HOSPITAL_COMMUNITY): Payer: Self-pay

## 2018-06-27 ENCOUNTER — Encounter (HOSPITAL_COMMUNITY)
Admission: RE | Admit: 2018-06-27 | Discharge: 2018-06-27 | Disposition: A | Discharge status: Home / Self Care | Payer: Medicare Other | Source: Ambulatory Visit | Attending: Ophthalmology | Admitting: Ophthalmology

## 2018-06-29 ENCOUNTER — Encounter (HOSPITAL_COMMUNITY): Payer: Self-pay | Admitting: *Deleted

## 2018-06-30 ENCOUNTER — Encounter (HOSPITAL_COMMUNITY): Payer: Self-pay | Admitting: *Deleted

## 2018-06-30 ENCOUNTER — Encounter (HOSPITAL_COMMUNITY)
Admission: RE | Disposition: A | Discharge status: Home / Self Care | Payer: Self-pay | Source: Ambulatory Visit | Attending: Ophthalmology

## 2018-06-30 ENCOUNTER — Ambulatory Visit (HOSPITAL_COMMUNITY): Payer: Medicare Other | Admitting: Anesthesiology

## 2018-06-30 ENCOUNTER — Ambulatory Visit (HOSPITAL_COMMUNITY)
Admission: RE | Admit: 2018-06-30 | Discharge: 2018-06-30 | Disposition: A | Discharge status: Home / Self Care | Payer: Medicare Other | Source: Ambulatory Visit | Attending: Ophthalmology | Admitting: Ophthalmology

## 2018-06-30 ENCOUNTER — Other Ambulatory Visit: Payer: Self-pay

## 2018-06-30 DIAGNOSIS — Z87891 Personal history of nicotine dependence: Secondary | ICD-10-CM | POA: Diagnosis not present

## 2018-06-30 DIAGNOSIS — J449 Chronic obstructive pulmonary disease, unspecified: Secondary | ICD-10-CM | POA: Diagnosis not present

## 2018-06-30 DIAGNOSIS — I252 Old myocardial infarction: Secondary | ICD-10-CM | POA: Insufficient documentation

## 2018-06-30 DIAGNOSIS — Z8673 Personal history of transient ischemic attack (TIA), and cerebral infarction without residual deficits: Secondary | ICD-10-CM | POA: Insufficient documentation

## 2018-06-30 DIAGNOSIS — H25811 Combined forms of age-related cataract, right eye: Secondary | ICD-10-CM | POA: Insufficient documentation

## 2018-06-30 DIAGNOSIS — I2510 Atherosclerotic heart disease of native coronary artery without angina pectoris: Secondary | ICD-10-CM | POA: Diagnosis not present

## 2018-06-30 DIAGNOSIS — H2181 Floppy iris syndrome: Secondary | ICD-10-CM | POA: Insufficient documentation

## 2018-06-30 DIAGNOSIS — I110 Hypertensive heart disease with heart failure: Secondary | ICD-10-CM | POA: Diagnosis not present

## 2018-06-30 DIAGNOSIS — H2511 Age-related nuclear cataract, right eye: Secondary | ICD-10-CM | POA: Diagnosis not present

## 2018-06-30 DIAGNOSIS — I739 Peripheral vascular disease, unspecified: Secondary | ICD-10-CM | POA: Insufficient documentation

## 2018-06-30 DIAGNOSIS — I509 Heart failure, unspecified: Secondary | ICD-10-CM | POA: Diagnosis not present

## 2018-06-30 DIAGNOSIS — I10 Essential (primary) hypertension: Secondary | ICD-10-CM | POA: Diagnosis not present

## 2018-06-30 HISTORY — PX: CATARACT EXTRACTION W/PHACO: SHX586

## 2018-06-30 SURGERY — PHACOEMULSIFICATION, CATARACT, WITH IOL INSERTION
Anesthesia: Monitor Anesthesia Care | Site: Eye | Laterality: Right

## 2018-06-30 MED ORDER — TETRACAINE HCL 0.5 % OP SOLN
1.0000 [drp] | OPHTHALMIC | Status: AC
  Administered 2018-06-30 (×3): 1 [drp] via OPHTHALMIC

## 2018-06-30 MED ORDER — PROVISC 10 MG/ML IO SOLN
INTRAOCULAR | Status: DC | PRN
  Administered 2018-06-30: 0.85 mL via INTRAOCULAR

## 2018-06-30 MED ORDER — LIDOCAINE HCL 3.5 % OP GEL: 1.0000 "application " | Freq: Once | OPHTHALMIC | Status: DC

## 2018-06-30 MED ORDER — MIDAZOLAM HCL 2 MG/2ML IJ SOLN
INTRAMUSCULAR | Status: DC | PRN
  Administered 2018-06-30 (×2): 1 mg via INTRAVENOUS

## 2018-06-30 MED ORDER — PHENYLEPHRINE HCL 2.5 % OP SOLN
1.0000 [drp] | OPHTHALMIC | Status: AC
  Administered 2018-06-30 (×3): 1 [drp] via OPHTHALMIC

## 2018-06-30 MED ORDER — CYCLOPENTOLATE-PHENYLEPHRINE 0.2-1 % OP SOLN
1.0000 [drp] | OPHTHALMIC | Status: AC
  Administered 2018-06-30 (×3): 1 [drp] via OPHTHALMIC

## 2018-06-30 MED ORDER — LACTATED RINGERS IV SOLN
INTRAVENOUS | Status: DC
  Administered 2018-06-30: 08:00:00 via INTRAVENOUS

## 2018-06-30 MED ORDER — SODIUM HYALURONATE 23 MG/ML IO SOLN
INTRAOCULAR | Status: DC | PRN
  Administered 2018-06-30: 0.6 mL via INTRAOCULAR

## 2018-06-30 MED ORDER — EPINEPHRINE PF 1 MG/ML IJ SOLN
INTRAOCULAR | Status: DC | PRN
  Administered 2018-06-30: 500 mL

## 2018-06-30 MED ORDER — LIDOCAINE HCL (PF) 1 % IJ SOLN
INTRAOCULAR | Status: DC | PRN
  Administered 2018-06-30: 1 mL via OPHTHALMIC

## 2018-06-30 MED ORDER — NEOMYCIN-POLYMYXIN-DEXAMETH 3.5-10000-0.1 OP SUSP
OPHTHALMIC | Status: DC | PRN
  Administered 2018-06-30: 2 [drp] via OPHTHALMIC

## 2018-06-30 MED ORDER — BSS IO SOLN
INTRAOCULAR | Status: DC | PRN
  Administered 2018-06-30: 15 mL

## 2018-06-30 MED ORDER — TETRACAINE 0.5 % OP SOLN OPTIME - NO CHARGE
OPHTHALMIC | Status: DC | PRN
Start: 2018-06-30 — End: 2018-06-30
  Administered 2018-06-30: 2 [drp] via OPHTHALMIC

## 2018-06-30 MED ORDER — POVIDONE-IODINE 5 % OP SOLN
OPHTHALMIC | Status: DC | PRN
  Administered 2018-06-30: 1 via OPHTHALMIC

## 2018-06-30 SURGICAL SUPPLY — 16 items

## 2018-06-30 NOTE — Anesthesia Procedure Notes (Signed)
Procedure Name: MAC Date/Time: 06/30/2018 8:29 AM Performed by: Vista Deck, CRNA Pre-anesthesia Checklist: Patient identified, Emergency Drugs available, Suction available, Timeout performed and Patient being monitored Patient Re-evaluated:Patient Re-evaluated prior to induction Oxygen Delivery Method: Nasal Cannula

## 2018-06-30 NOTE — Anesthesia Postprocedure Evaluation (Signed)
Anesthesia Post Note  Patient: Joshua Horton  Procedure(s) Performed: CATARACT EXTRACTION PHACO AND INTRAOCULAR LENS PLACEMENT ([place]) (Right Eye)  Patient location during evaluation: Short Stay Anesthesia Type: MAC Level of consciousness: awake and alert and patient cooperative Pain management: pain level controlled Vital Signs Assessment: post-procedure vital signs reviewed and stable Respiratory status: spontaneous breathing Cardiovascular status: stable Postop Assessment: no apparent nausea or vomiting Anesthetic complications: no     Last Vitals:  Vitals:   06/30/18 0709 06/30/18 0856  BP: (!) 161/71 (!) 154/73  Pulse: 87 71  Resp: 20 18  Temp: 36.6 C 36.7 C  SpO2: 92% 95%    Last Pain:  Vitals:   06/30/18 0856  TempSrc: Oral  PainSc: 0-No pain                 Jahad Old

## 2018-06-30 NOTE — Anesthesia Preprocedure Evaluation (Signed)
Anesthesia Evaluation  Patient identified by MRN, date of birth, ID band Patient awake    Reviewed: Allergy & Precautions, H&P , NPO status , Patient's Chart, lab work & pertinent test results, reviewed documented beta blocker date and time   Airway Mallampati: III  TM Distance: >3 FB Neck ROM: full    Dental no notable dental hx. (+) Edentulous Upper, Edentulous Lower   Pulmonary neg pulmonary ROS, COPD, former smoker,    Pulmonary exam normal breath sounds clear to auscultation       Cardiovascular Exercise Tolerance: Good hypertension, + CAD, + Past MI, + Peripheral Vascular Disease and +CHF  negative cardio ROS   Rhythm:regular Rate:Normal     Neuro/Psych CVA negative neurological ROS  negative psych ROS   GI/Hepatic negative GI ROS, Neg liver ROS, GERD  ,  Endo/Other  negative endocrine ROS  Renal/GU negative Renal ROS  negative genitourinary   Musculoskeletal  (+) Arthritis ,   Abdominal   Peds  Hematology negative hematology ROS (+)   Anesthesia Other Findings SR with sinus arrhythmia 75  Reproductive/Obstetrics negative OB ROS                             Anesthesia Physical  Anesthesia Plan  ASA: III  Anesthesia Plan: MAC   Post-op Pain Management:    Induction:   PONV Risk Score and Plan:   Airway Management Planned:   Additional Equipment:   Intra-op Plan:   Post-operative Plan:   Informed Consent: I have reviewed the patients History and Physical, chart, labs and discussed the procedure including the risks, benefits and alternatives for the proposed anesthesia with the patient or authorized representative who has indicated his/her understanding and acceptance.     Plan Discussed with: CRNA and Anesthesiologist  Anesthesia Plan Comments:         Anesthesia Quick Evaluation

## 2018-06-30 NOTE — Op Note (Signed)
Date of procedure: 06/30/18  Pre-operative diagnosis: Visually significant cataract, Right Eye; Poor Dilation, Right Eye (H25.811; H21.81)  Post-operative diagnosis: Visually significant cataract, Right Eye; Intra-operative Floppy Iris Syndrome, Right Eye  Procedure: Complex removal of cataract via phacoemulsification and insertion of intra-ocular lens [employer] Vision PCB00  +20.5D into the capsular bag of the Right Eye  Attending surgeon: Gerda Diss. Jacklyne Baik, MD, MA  Anesthesia: MAC, Topical Akten  Complications: None  Estimated Blood Loss: <71m (minimal)  Specimens: None  Implants: As above  Indications:  Visually significant cataract, Right Eye  Procedure:  The patient was seen and identified in the pre-operative area. The operative eye was identified and dilated.  The operative eye was marked.  Topical anesthesia was administered to the operative eye.     The patient was then to the operative suite and placed in the supine position.  A timeout was performed confirming the patient, procedure to be performed, and all other relevant information.   The patient's face was prepped and draped in the usual fashion for intra-ocular surgery.  A lid speculum was placed into the operative eye and the surgical microscope moved into place and focused.  Poor dilation secondary to Flomax use was confirmed.  A superotemporal paracentesis was created using a 20 gauge paracentesis blade.  Shugarcaine was injected into the anterior chamber.  Viscoelastic was injected into the anterior chamber.  A temporal clear-corneal main wound incision was created using a 2.452mmicrokeratome.  A Malyugin ring was placed.  A continuous curvilinear capsulorrhexis was initiated using an irrigating cystitome and completed using capsulorrhexis forceps.  Hydrodissection and hydrodeliniation were performed.  Viscoelastic was injected into the anterior chamber.  A phacoemulsification handpiece and a chopper as a second  instrument were used to remove the nucleus and epinucleus. The irrigation/aspiration handpiece was used to remove any remaining cortical material.   The capsular bag was reinflated with viscoelastic, checked, and found to be intact.  The intraocular lens was inserted into the capsular bag and dialed into place using a Kuglen hook.  The Malyugin ring was removed.  The irrigation/aspiration handpiece was used to remove any remaining viscoelastic.  The clear corneal wound and paracentesis wounds were then hydrated and checked with Weck-Cels to be watertight.  The lid-speculum and drape was removed, and the patient's face was cleaned with a wet and dry 4x4.  Maxitrol was instilled in the eye before a clear shield was taped over the eye. The patient was taken to the post-operative care unit in good condition, having tolerated the procedure well.  Post-Op Instructions: [facility] and will receive all other orders and instructions.

## 2018-06-30 NOTE — Transfer of Care (Signed)
Immediate Anesthesia Transfer of Care Note  Patient: Joshua Horton [facility]  Procedure(s) Performed: CATARACT EXTRACTION PHACO AND INTRAOCULAR LENS PLACEMENT ([facility]) (Right Eye)  Patient Location: Short Stay  Anesthesia Type:MAC  Level of Consciousness: awake, alert  and patient cooperative  Airway & Oxygen Therapy: Patient Spontanous Breathing  Post-op Assessment: Report given to RN and Post -op Vital signs reviewed and stable  Post vital signs: Reviewed and stable  Last Vitals:  Vitals Value Taken Time  BP    Temp    Pulse    Resp    SpO2      Last Pain:  Vitals:   06/30/18 0709  TempSrc: Oral  PainSc: 0-No pain      Patients Stated Pain Goal: 5 (11/46/43 1427)  Complications: No apparent anesthesia complications

## 2018-06-30 NOTE — H&P (Signed)
The H and P was reviewed and updated. The patient was examined.  No changes were found after exam.  The surgical eye was marked.  

## 2018-06-30 NOTE — Addendum Note (Signed)
Addendum  created 06/30/18 0935 by Vista Deck, CRNA   Intraprocedure Flowsheets edited

## 2018-06-30 NOTE — Discharge Instructions (Signed)
Cataract A cataract is cloudiness on the lens of your eye. The lens is the clear part of your eye that is behind your iris and pupil. The lens focuses light on the retina, which lets you see clearly. When a lens becomes cloudy, vision may become blurry. The clouding can range from a tiny dot to complete cloudiness. As some cataracts develop, they make a person more nearsighted. Other cataracts increase glare. Cataracts can worsen over time, and sometimes the pupil can look white. Cataracts get bigger and they cloud more of the lens, making it difficult to see. Cataracts can affect one eye or both eyes. What are the causes? Most cataracts are associated with age-related eye changes. The eye lens is mostly made up of water and protein. Normally, this protein is arranged in a way that keeps the lens clear. Cataracts develop when protein begins to clump together over time. This clouds the lens and lets less light pass through to the retina, which causes blurry vision. What increases the risk? This condition is more likely to develop in people who:  Are 93 years of age or older.  Have diabetes.  Have high blood pressure.  Takecertain medicines, such as steroids or hormone replacement therapy.  Have had an eye injury.  Have or have had eye inflammation.  Have a family history of cataracts.  Smoke.  Drink alcohol heavily.  Are frequently exposed to sun or very strong light without eye protection.  Are obese.  Have been exposed to large amounts of radiation, lead, or other toxic substances.  Have had eye surgery.  What are the signs or symptoms? The main symptom of a cataract is blurry vision. Your vision may change or get worse over time. Other symptoms include:  Increased glare.  Seeing a bright ring or halo around light.  Poor night vision.  Double vision in one eye.  Having trouble seeing, even while wearing contact lenses or glasses.  Seeing colors that appear  faded.  Trouble telling the difference between blue and purple.  Needing frequent changes to your prescription glasses or contacts.  How is this diagnosed? This condition is diagnosed with a medical history and eye exam. You may need to see an eye specialist (optometrist or ophthalmologist). Your health care provider may enlarge (dilate) your pupils with eye drops to see the back of your eye more clearly and look for signs of cataracts or other damage. You may also have tests, including:  A visual acuity test. This uses a chart to determine the smallest letters that you can see from a specific distance.  A slit-lamp exam. This uses a microscope to examine small sections of your eye for abnormalities.  Tonometry. This test measures the pressure of the fluid inside your eye.  How is this treated? Treatment depends on the stage of your cataract. For an early cataract, vision may improve by using different eyeglasses or stronger lighting. If that does not help your vision, surgery may be recommended to remove the cataract. If your health care provider thinks your cataract may be linked to any medicines that you are taking, he or she may change your medicines. Follow these instructions at home: Lifestyle  Use stronger or brighter lighting.  Consider using a magnifying glass for reading or other activities.  Become familiar with your surroundings. Having poor vision can put you at a greater risk for tripping, falling, or bumping into things.  Wear sunglasses and a hat if you are sensitive to bright light  or are having problems with glare.  Quit smoking if you smoke. If you need help quitting, talk with your health care provider. General instructions  If you are prescribed new eyeglasses, wear them as told by your health care provider.  Take over-the-counter and prescription medicines only as told by your health care provider. Do not change your medicines unless told by your health care  provider.  Do not drive or operate heavy machinery if your vision is blurry, particularly at night.  Keep your blood sugar under control, if you have diabetes.  Keep all follow-up visits as told by your health care provider. This is important. Contact a health care provider if:  Your symptoms get worse.  Your vision affects your ability to perform daily activities.  You have new symptoms.  You have a fever. Get help right away if:  You have sudden vision loss.  You have redness, swelling, or increasing pain in your eye.  You develop a headache and sensitivity to light. This information is not intended to replace advice given to you by your health care provider. Make sure you discuss any questions you have with your health care provider. Document Released: 11/01/2005 Document Revised: 03/11/2016 Document Reviewed: 05/07/2015 Elsevier Interactive Patient Education  2018 [place] Anesthesia is a term that refers to techniques, procedures, and medicines that help a person stay safe and comfortable during a medical procedure. Monitored anesthesia care, or sedation, is one type of anesthesia. Your anesthesia specialist may recommend sedation if you will be having a procedure that does not require you to be unconscious, such as:  Cataract surgery.  A dental procedure.  A biopsy.  A colonoscopy.  During the procedure, you may receive a medicine to help you relax (sedative). There are three levels of sedation:  Mild sedation. At this level, you may feel awake and relaxed. You will be able to follow directions.  Moderate sedation. At this level, you will be sleepy. You may not remember the procedure.  Deep sedation. At this level, you will be asleep. You will not remember the procedure.  The more medicine you are given, the deeper your level of sedation will be. Depending on how you respond to the procedure, the anesthesia specialist may  change your level of sedation or the type of anesthesia to fit your needs. An anesthesia specialist will monitor you closely during the procedure. Let your health care provider know about:  Any allergies you have.  All medicines you are taking, including vitamins, herbs, eye drops, creams, and over-the-counter medicines.  Any use of steroids (by mouth or as a cream).  Any problems you or family members have had with sedatives and anesthetic medicines.  Any blood disorders you have.  Any surgeries you have had.  Any medical conditions you have, such as sleep apnea.  Whether you are pregnant or may be pregnant.  Any use of cigarettes, alcohol, or street drugs. What are the risks? Generally, this is a safe procedure. However, problems may occur, including:  Getting too much medicine (oversedation).  Nausea.  Allergic reaction to medicines.  Trouble breathing. If this happens, a breathing tube may be used to help with breathing. It will be removed when you are awake and breathing on your own.  Heart trouble.  Lung trouble.  Before the procedure Staying hydrated Follow instructions from your health care provider about hydration, which may include:  Up to 2 hours before the procedure - you  may continue to drink clear liquids, such as water, clear fruit juice, black coffee, and plain tea.  Eating and drinking restrictions Follow instructions from your health care provider about eating and drinking, which may include:  8 hours before the procedure - stop eating heavy meals or foods such as meat, fried foods, or fatty foods.  6 hours before the procedure - stop eating light meals or foods, such as toast or cereal.  6 hours before the procedure - stop drinking milk or drinks that contain milk.  2 hours before the procedure - stop drinking clear liquids.  Medicines Ask your health care provider about:  Changing or stopping your regular medicines. This is especially  important if you are taking diabetes medicines or blood thinners.  Taking medicines such as aspirin and ibuprofen. These medicines can thin your blood. Do not take these medicines before your procedure if your health care provider instructs you not to.  Tests and exams  You will have a physical exam.  You may have blood tests done to show: ? How well your kidneys and liver are working. ? How well your blood can clot.  General instructions  Plan to have someone take you home from the hospital or clinic.  If you will be going home right after the procedure, plan to have someone with you for 24 hours.  What happens during the procedure?  Your blood pressure, heart rate, breathing, level of pain and overall condition will be monitored.  An IV tube will be inserted into one of your veins.  Your anesthesia specialist will give you medicines as needed to keep you comfortable during the procedure. This may mean changing the level of sedation.  The procedure will be performed. After the procedure  Your blood pressure, heart rate, breathing rate, and blood oxygen level will be monitored until the medicines you were given have worn off.  Do not drive for 24 hours if you received a sedative.  You may: ? Feel sleepy, clumsy, or nauseous. ? Feel forgetful about what happened after the procedure. ? Have a sore throat if you had a breathing tube during the procedure. ? Vomit. This information is not intended to replace advice given to you by your health care provider. Make sure you discuss any questions you have with your health care provider. Document Released: 07/28/2005 Document Revised: 04/09/2016 Document Reviewed: 02/22/2016 [employer] Interactive Patient Education  [employer].

## 2018-07-03 ENCOUNTER — Encounter (HOSPITAL_COMMUNITY): Payer: Self-pay | Admitting: Ophthalmology

## 2018-08-29 DIAGNOSIS — Z7982 Long term (current) use of aspirin: Secondary | ICD-10-CM | POA: Diagnosis not present

## 2018-08-29 DIAGNOSIS — I6312 Cerebral infarction due to embolism of basilar artery: Secondary | ICD-10-CM | POA: Diagnosis not present

## 2018-08-29 DIAGNOSIS — I10 Essential (primary) hypertension: Secondary | ICD-10-CM | POA: Diagnosis not present

## 2018-08-29 DIAGNOSIS — I6502 Occlusion and stenosis of left vertebral artery: Secondary | ICD-10-CM | POA: Diagnosis not present

## 2018-08-29 DIAGNOSIS — I63532 Cerebral infarction due to unspecified occlusion or stenosis of left posterior cerebral artery: Secondary | ICD-10-CM | POA: Diagnosis not present

## 2018-08-29 DIAGNOSIS — I6622 Occlusion and stenosis of left posterior cerebral artery: Secondary | ICD-10-CM | POA: Diagnosis not present

## 2018-08-29 DIAGNOSIS — R4020 Unspecified coma: Secondary | ICD-10-CM | POA: Diagnosis not present

## 2018-08-29 DIAGNOSIS — I63212 Cerebral infarction due to unspecified occlusion or stenosis of left vertebral arteries: Secondary | ICD-10-CM | POA: Diagnosis not present

## 2018-08-29 DIAGNOSIS — Z4682 Encounter for fitting and adjustment of non-vascular catheter: Secondary | ICD-10-CM | POA: Diagnosis not present

## 2018-08-29 DIAGNOSIS — R402343 Coma scale, best motor response, flexion withdrawal, at hospital admission: Secondary | ICD-10-CM | POA: Diagnosis not present

## 2018-08-29 DIAGNOSIS — I745 Embolism and thrombosis of iliac artery: Secondary | ICD-10-CM | POA: Diagnosis not present

## 2018-08-29 DIAGNOSIS — Z515 Encounter for palliative care: Secondary | ICD-10-CM | POA: Diagnosis not present

## 2018-08-29 DIAGNOSIS — Z79899 Other long term (current) drug therapy: Secondary | ICD-10-CM | POA: Diagnosis not present

## 2018-08-29 DIAGNOSIS — Z9861 Coronary angioplasty status: Secondary | ICD-10-CM | POA: Diagnosis not present

## 2018-08-29 DIAGNOSIS — I6389 Other cerebral infarction: Secondary | ICD-10-CM | POA: Diagnosis not present

## 2018-08-29 DIAGNOSIS — I639 Cerebral infarction, unspecified: Secondary | ICD-10-CM | POA: Diagnosis not present

## 2018-08-29 DIAGNOSIS — I252 Old myocardial infarction: Secondary | ICD-10-CM | POA: Diagnosis not present

## 2018-08-29 DIAGNOSIS — S299XXA Unspecified injury of thorax, initial encounter: Secondary | ICD-10-CM | POA: Diagnosis not present

## 2018-08-29 DIAGNOSIS — R402213 Coma scale, best verbal response, none, at hospital admission: Secondary | ICD-10-CM | POA: Diagnosis not present

## 2018-08-29 DIAGNOSIS — E7800 Pure hypercholesterolemia, unspecified: Secondary | ICD-10-CM | POA: Diagnosis not present

## 2018-08-29 DIAGNOSIS — I959 Hypotension, unspecified: Secondary | ICD-10-CM | POA: Diagnosis not present

## 2018-08-29 DIAGNOSIS — I2510 Atherosclerotic heart disease of native coronary artery without angina pectoris: Secondary | ICD-10-CM | POA: Diagnosis not present

## 2018-08-29 DIAGNOSIS — E785 Hyperlipidemia, unspecified: Secondary | ICD-10-CM | POA: Diagnosis not present

## 2018-08-29 DIAGNOSIS — E1151 Type 2 diabetes mellitus with diabetic peripheral angiopathy without gangrene: Secondary | ICD-10-CM | POA: Diagnosis not present

## 2018-08-29 DIAGNOSIS — I651 Occlusion and stenosis of basilar artery: Secondary | ICD-10-CM | POA: Diagnosis not present

## 2018-08-29 DIAGNOSIS — R404 Transient alteration of awareness: Secondary | ICD-10-CM | POA: Diagnosis not present

## 2018-08-29 DIAGNOSIS — R1110 Vomiting, unspecified: Secondary | ICD-10-CM | POA: Diagnosis not present

## 2018-08-29 DIAGNOSIS — K219 Gastro-esophageal reflux disease without esophagitis: Secondary | ICD-10-CM | POA: Diagnosis not present

## 2018-08-29 DIAGNOSIS — Z955 Presence of coronary angioplasty implant and graft: Secondary | ICD-10-CM | POA: Diagnosis not present

## 2018-08-29 DIAGNOSIS — I6322 Cerebral infarction due to unspecified occlusion or stenosis of basilar arteries: Secondary | ICD-10-CM | POA: Diagnosis not present

## 2018-08-29 DIAGNOSIS — J9601 Acute respiratory failure with hypoxia: Secondary | ICD-10-CM | POA: Diagnosis not present

## 2018-08-29 DIAGNOSIS — Z66 Do not resuscitate: Secondary | ICD-10-CM | POA: Diagnosis not present

## 2018-08-29 DIAGNOSIS — I63012 Cerebral infarction due to thrombosis of left vertebral artery: Secondary | ICD-10-CM | POA: Diagnosis not present

## 2018-08-29 DIAGNOSIS — S069X9A Unspecified intracranial injury with loss of consciousness of unspecified duration, initial encounter: Secondary | ICD-10-CM | POA: Diagnosis not present

## 2018-08-29 DIAGNOSIS — I6789 Other cerebrovascular disease: Secondary | ICD-10-CM | POA: Diagnosis not present

## 2018-08-29 DIAGNOSIS — I69354 Hemiplegia and hemiparesis following cerebral infarction affecting left non-dominant side: Secondary | ICD-10-CM | POA: Diagnosis not present

## 2018-08-29 DIAGNOSIS — J449 Chronic obstructive pulmonary disease, unspecified: Secondary | ICD-10-CM | POA: Diagnosis not present

## 2018-08-29 DIAGNOSIS — R569 Unspecified convulsions: Secondary | ICD-10-CM | POA: Diagnosis not present

## 2018-08-29 DIAGNOSIS — R402113 Coma scale, eyes open, never, at hospital admission: Secondary | ICD-10-CM | POA: Diagnosis not present

## 2018-08-29 DIAGNOSIS — I6302 Cerebral infarction due to thrombosis of basilar artery: Secondary | ICD-10-CM | POA: Diagnosis not present

## 2018-08-29 DIAGNOSIS — J9600 Acute respiratory failure, unspecified whether with hypoxia or hypercapnia: Secondary | ICD-10-CM | POA: Diagnosis not present

## 2018-08-29 DIAGNOSIS — Z720 Tobacco use: Secondary | ICD-10-CM | POA: Diagnosis not present

## 2018-09-15 DEATH — deceased
